# Patient Record
Sex: Male | Born: 1945 | Race: White | Hispanic: No | Marital: Married | Smoking: Former smoker
Health system: Southern US, Community
[De-identification: ages and names within clinical notes are randomized; demographics above are authoritative.]

## PROBLEM LIST (undated history)

## (undated) DIAGNOSIS — N400 Enlarged prostate without lower urinary tract symptoms: Secondary | ICD-10-CM

## (undated) DIAGNOSIS — T7840XA Allergy, unspecified, initial encounter: Secondary | ICD-10-CM

## (undated) DIAGNOSIS — I10 Essential (primary) hypertension: Secondary | ICD-10-CM

## (undated) DIAGNOSIS — M1990 Unspecified osteoarthritis, unspecified site: Secondary | ICD-10-CM

## (undated) DIAGNOSIS — Z87442 Personal history of urinary calculi: Secondary | ICD-10-CM

## (undated) DIAGNOSIS — N189 Chronic kidney disease, unspecified: Secondary | ICD-10-CM

## (undated) DIAGNOSIS — R0609 Other forms of dyspnea: Secondary | ICD-10-CM

## (undated) DIAGNOSIS — J189 Pneumonia, unspecified organism: Secondary | ICD-10-CM

## (undated) DIAGNOSIS — I219 Acute myocardial infarction, unspecified: Secondary | ICD-10-CM

## (undated) DIAGNOSIS — R9439 Abnormal result of other cardiovascular function study: Secondary | ICD-10-CM

## (undated) DIAGNOSIS — Z789 Other specified health status: Secondary | ICD-10-CM

## (undated) DIAGNOSIS — Z136 Encounter for screening for cardiovascular disorders: Secondary | ICD-10-CM

## (undated) DIAGNOSIS — E7800 Pure hypercholesterolemia, unspecified: Secondary | ICD-10-CM

## (undated) DIAGNOSIS — E119 Type 2 diabetes mellitus without complications: Secondary | ICD-10-CM

## (undated) DIAGNOSIS — I2510 Atherosclerotic heart disease of native coronary artery without angina pectoris: Secondary | ICD-10-CM

## (undated) DIAGNOSIS — M109 Gout, unspecified: Secondary | ICD-10-CM

## (undated) DIAGNOSIS — R079 Chest pain, unspecified: Secondary | ICD-10-CM

## (undated) HISTORY — PX: HERNIA REPAIR: SHX51

## (undated) HISTORY — PX: COLONOSCOPY: SHX174

## (undated) HISTORY — PX: CYSTOSCOPY WITH URETEROSCOPY, STONE BASKETRY AND STENT PLACEMENT: SHX6378

## (undated) HISTORY — DX: Other forms of dyspnea: R06.09

## (undated) HISTORY — DX: Chronic kidney disease, unspecified: N18.9

## (undated) HISTORY — DX: Allergy, unspecified, initial encounter: T78.40XA

## (undated) HISTORY — DX: Benign prostatic hyperplasia without lower urinary tract symptoms: N40.0

## (undated) HISTORY — DX: Encounter for screening for cardiovascular disorders: Z13.6

## (undated) HISTORY — PX: JOINT REPLACEMENT: SHX530

## (undated) HISTORY — PX: UPPER GASTROINTESTINAL ENDOSCOPY: SHX188

---

## 1990-10-02 HISTORY — PX: UMBILICAL HERNIA REPAIR: SHX196

## 2013-04-08 DIAGNOSIS — N400 Enlarged prostate without lower urinary tract symptoms: Secondary | ICD-10-CM | POA: Insufficient documentation

## 2014-10-02 HISTORY — PX: KNEE ARTHROSCOPY: SHX127

## 2015-05-12 DIAGNOSIS — L821 Other seborrheic keratosis: Secondary | ICD-10-CM | POA: Insufficient documentation

## 2015-05-12 DIAGNOSIS — E119 Type 2 diabetes mellitus without complications: Secondary | ICD-10-CM

## 2015-05-12 DIAGNOSIS — I10 Essential (primary) hypertension: Secondary | ICD-10-CM | POA: Insufficient documentation

## 2015-05-12 DIAGNOSIS — L919 Hypertrophic disorder of the skin, unspecified: Secondary | ICD-10-CM

## 2015-05-12 DIAGNOSIS — L909 Atrophic disorder of skin, unspecified: Secondary | ICD-10-CM | POA: Insufficient documentation

## 2015-05-12 DIAGNOSIS — M109 Gout, unspecified: Secondary | ICD-10-CM | POA: Insufficient documentation

## 2015-05-12 DIAGNOSIS — E785 Hyperlipidemia, unspecified: Secondary | ICD-10-CM | POA: Insufficient documentation

## 2015-05-12 DIAGNOSIS — E782 Mixed hyperlipidemia: Secondary | ICD-10-CM | POA: Insufficient documentation

## 2015-05-12 HISTORY — DX: Essential (primary) hypertension: I10

## 2015-05-12 HISTORY — DX: Type 2 diabetes mellitus without complications: E11.9

## 2016-11-26 DIAGNOSIS — M1812 Unilateral primary osteoarthritis of first carpometacarpal joint, left hand: Secondary | ICD-10-CM | POA: Insufficient documentation

## 2016-12-05 LAB — HM COLONOSCOPY

## 2016-12-08 DIAGNOSIS — D126 Benign neoplasm of colon, unspecified: Secondary | ICD-10-CM | POA: Insufficient documentation

## 2017-04-01 DIAGNOSIS — I219 Acute myocardial infarction, unspecified: Secondary | ICD-10-CM

## 2017-04-01 HISTORY — DX: Acute myocardial infarction, unspecified: I21.9

## 2017-05-23 ENCOUNTER — Encounter (HOSPITAL_COMMUNITY): Payer: Self-pay | Admitting: Cardiology

## 2017-05-23 NOTE — H&P (Signed)
Nicholas Maxwell is an 71 y.o. male.    (From clinic encounter dated 05/11/2017 and subsequent telephone conversations)  Chief Complaint: Chest pain  HPI: Nicholas Maxwell  is a 71 y.o. male  With hypertension, type 2 DM with exertional chest pain associated with shortness of breath. He underwent workup EKG, echocardiogram, nuclear stress test that showed high risk findings as below. He is on two anti anginal medications with continued symptoms of angina. He is referred for cardiac catheterization to define coronary anatomy and offer revascularization, as appropriate  Past Medical History:  Diagnosis Date  . Abnormal stress test 05/18/2017  . Chest pain   . Diabetes mellitus without complication ([place])   . Hypertension   . Statin intolerance    Used >2 different statins    Past Surgical History:  Procedure Laterality Date  . knee surgery Right 2016   Arthroscopic  . UMBILICAL HERNIA REPAIR  1992    Family History  Problem Relation Age of Onset  . Transient ischemic attack Father 23  . Bone cancer Father   . CAD Mother 87       CABG   Social History:  reports that he has quit smoking. His smoking use included Cigarettes. He does not have any smokeless tobacco history on file. He reports that he does not drink alcohol or use drugs.  Allergies:  Allergies  Allergen Reactions  . Crestor [Rosuvastatin Calcium] Anaphylaxis and Swelling    Tongue swells/throat closes  . Statins Other (See Comments)    Muscle/joint pain with ALL other statin drug but Crestor is severe   Review of Systems Joya Gaskins Therisa Mennella MD; 05/11/2017 3:45 PM) General Not Present- Appetite Loss and Weight Gain. Respiratory Not Present- Chronic Cough and Wakes up from Sleep Wheezing or Short of Breath. Cardiovascular Present- Chest Pain, Difficulty Breathing On Exertion and Shortness of Breath. Not Present- Difficulty Breathing Lying Down, Fainting, Leg Cramps, Leg Pain and/or Swelling, Palpitations and Swelling  of Extremities. Gastrointestinal Not Present- Black, Tarry Stool and Difficulty Swallowing. Musculoskeletal Not Present- Decreased Range of Motion and Muscle Atrophy. Neurological Not Present- Attention Deficit. Psychiatric Not Present- Personality Changes and Suicidal Ideation. Endocrine Not Present- Cold Intolerance and Heat Intolerance. Hematology Not Present- Abnormal Bleeding. All other systems negative   Vitals (April Garrison; 05/11/2017 2:43 PM) 05/11/2017 2:17 PM Weight: 202.31 lb Height: 71in Body Surface Area: 2.12 m Body Mass Index: 28.22 kg/m  Pulse: 71 (Regular)  P.OX: 98% (Room air) BP: 122/72 (Sitting, Left Arm, Standard)   Physical Exam Joya Gaskins Sevannah Madia MD; 05/11/2017 3:47 PM) General Mental Status-Alert. General Appearance-Cooperative and Appears stated age. Build & Nutrition-Moderately built.  Head and Neck Thyroid Gland Characteristics - normal size and consistency and no palpable nodules.  Chest and Lung Exam Chest and lung exam reveals -quiet, even and easy respiratory effort with no use of accessory muscles, non-tender and on auscultation, normal breath sounds, no adventitious sounds.  Cardiovascular Cardiovascular examination reveals -normal heart sounds, regular rate and rhythm with no murmurs, carotid auscultation reveals no bruits and abdominal aorta auscultation reveals no bruits and no prominent pulsation.  Abdomen Palpation/Percussion Palpation and Percussion of the abdomen reveal - Non Tender and No hepatosplenomegaly.  Peripheral Vascular Lower Extremity Inspection - Bilateral - Varicose veins(Mild varicosities bilaterally.). Palpation - Femoral pulse - Bilateral - 2+. Popliteal pulse - Bilateral - 2+. Dorsalis pedis pulse - Bilateral - 2+. Posterior tibial pulse - Bilateral - 2+. Lower Extremity Palpation - Pulses bilaterally normal. Carotid arteries - Bilateral-No Carotid  bruit.  Neurologic Neurologic evaluation  reveals -alert and oriented x 3 with no impairment of recent or remote memory. Motor-Grossly intact without any focal deficits.  Musculoskeletal Global Assessment Left Lower Extremity - no deformities, masses or tenderness, no known fractures. Right Lower Extremity - no deformities, masses or tenderness, no known fractures.  No results found for this or any previous visit (from the past 48 hour(s)).  Labs: Labs 05/21/2017: BUN 23/crit in 1.08 sodium 139/potassium 4.4. Glucose 100 WBC 8.2, hemoglobin 13.4/hematocrit 38.7. Platelets 218 INR 1.1 Labs 05/08/2017: LDL 123, total cholesterol 172, triglycerides 160, HDL 35 A1c 6.1 Sodium 139, potassium 4.3, BUN 21, creatinine 1.15. EGFR >60 Protein 7.0, albumin 4.4, normal liver enzymes WBC 6.2, hemoglobin 14.2, hematocrit 42.4, mild macrocytic index 94.7, platelets 216 BNP 156 Normal chest x-ray Recorded 05/22/2017 05:12 PM by Vernell Leep MD, Annotation/Addendum.  Lipid Panel  05/08/2017: LDL 123, total cholesterol 172, triglycerides 160, HDL 35  BNP (last 3 results) None  HEMOGLOBIN A1C 6.1  Cardiac Panel (last 3 results) None  TSH None  No prescriptions prior to admission.     No current facility-administered medications for this encounter.   Current Outpatient Prescriptions:  .  alfuzosin (UROXATRAL) 10 MG 24 hr tablet, Take 10 mg by mouth daily with supper., Disp: , Rfl:  .  allopurinol (ZYLOPRIM) 300 MG tablet, Take 300 mg by mouth daily with lunch., Disp: , Rfl:  .  aspirin EC 81 MG tablet, Take 81 mg by mouth daily with breakfast., Disp: , Rfl:  .  isosorbide mononitrate (IMDUR) 30 MG 24 hr tablet, Take 30 mg by mouth daily., Disp: , Rfl:  .  metFORMIN (GLUCOPHAGE-XR) 500 MG 24 hr tablet, Take 1,000 mg by mouth daily with supper., Disp: , Rfl:  .  metoprolol succinate (TOPROL-XL) 25 MG 24 hr tablet, Take 25 mg by mouth daily with breakfast., Disp: , Rfl:  .  nitroGLYCERIN (NITROSTAT) 0.4 MG SL tablet, Place 0.4 mg  under the tongue every 5 (five) minutes x 3 doses as needed. For chest pain., Disp: , Rfl:  .  ramipril (ALTACE) 2.5 MG capsule, Take 2.5 mg by mouth daily with breakfast., Disp: , Rfl:  .  ramipril (ALTACE) 5 MG capsule, Take 5 mg by mouth daily with breakfast., Disp: , Rfl:  .  REPATHA SURECLICK 884 MG/ML SOAJ, Inject 140 mg into the skin every 14 (fourteen) days., Disp: , Rfl:   CARDIAC STUDIES:  Echocardiogram 05/15/2017: Left ventricle cavity is normal in size. Left ventricle regional wall motion findings: Basal anterolateral, Basal inferolateral, Mid anterolateral and Mid inferolateral hypokinesis. Calculated EF 55%. Structurally normal mitral valve with moderate (Grade II) regurgitation.  Echocardiogram 05/15/2017: Left ventricle cavity is normal in size. Left ventricle regional wall motion findings: Basal anterolateral, Basal inferolateral, Mid anterolateral and Mid inferolateral hypokinesis. Calculated EF 55%. Structurally normal mitral valve with moderate (Grade II) regurgitation.  Exercise myoview stress 05/18/2017: 1. The patient performed treadmill exercise using a Bruce protocol, completing 8 minutes. The patient completed an estimated workload of 10.12 METS achieving 85% maximum predicted heart rate. Stress symptoms included 4/10 chest pain and dyspnea.  2. The stress electrocardiogram revealed 1-1.5 mm of horizontal ST depression in lead (s):II, III, aVF, V4, V5, V6 consistent with myocardial ischemia.   3. This is an abnormal myocardial perfusion imaging study demonstrating a mixture of scar plus ischemia in the basal inferoseptal, basal inferior, basal inferolateral, mid inferoseptal, mid inferior, mid inferolateral and apical inferior myocardial wall(s) (LCx/RCA territory) 4.  Left ventricular systolic function was abnormal with regional wall motion abnormalities.  The left ventricular ejection fraction was calculated to be 39%.   5. This is a high risk  study  Assessment/Plan CAD w/stable angina Two anti anginal medications High risk stress non-invasive stress test  Plan: Left heart cath, coronary angiography with possible angioplasty  Nigel Mormon, MD 05/23/2017, 7:59 PM [facility] Pager: [phone] Office: [phone] If no answer: Cell:  [phone]

## 2017-05-24 ENCOUNTER — Encounter (HOSPITAL_COMMUNITY): Payer: Self-pay | Admitting: General Practice

## 2017-05-24 ENCOUNTER — Ambulatory Visit (HOSPITAL_COMMUNITY)
Admission: RE | Admit: 2017-05-24 | Discharge: 2017-05-25 | Disposition: A | Discharge status: Home / Self Care | Payer: Medicare HMO | Source: Ambulatory Visit | Attending: Cardiology | Admitting: Cardiology

## 2017-05-24 ENCOUNTER — Encounter (HOSPITAL_COMMUNITY)
Admission: RE | Disposition: A | Discharge status: Home / Self Care | Payer: Self-pay | Source: Ambulatory Visit | Attending: Cardiology

## 2017-05-24 DIAGNOSIS — Z7982 Long term (current) use of aspirin: Secondary | ICD-10-CM | POA: Diagnosis not present

## 2017-05-24 DIAGNOSIS — I10 Essential (primary) hypertension: Secondary | ICD-10-CM | POA: Insufficient documentation

## 2017-05-24 DIAGNOSIS — I2584 Coronary atherosclerosis due to calcified coronary lesion: Secondary | ICD-10-CM | POA: Insufficient documentation

## 2017-05-24 DIAGNOSIS — E119 Type 2 diabetes mellitus without complications: Secondary | ICD-10-CM | POA: Diagnosis not present

## 2017-05-24 DIAGNOSIS — Z87891 Personal history of nicotine dependence: Secondary | ICD-10-CM | POA: Diagnosis not present

## 2017-05-24 DIAGNOSIS — I25118 Atherosclerotic heart disease of native coronary artery with other forms of angina pectoris: Secondary | ICD-10-CM | POA: Insufficient documentation

## 2017-05-24 DIAGNOSIS — Z9861 Coronary angioplasty status: Secondary | ICD-10-CM

## 2017-05-24 DIAGNOSIS — Z8249 Family history of ischemic heart disease and other diseases of the circulatory system: Secondary | ICD-10-CM | POA: Insufficient documentation

## 2017-05-24 DIAGNOSIS — I209 Angina pectoris, unspecified: Secondary | ICD-10-CM

## 2017-05-24 DIAGNOSIS — Z7902 Long term (current) use of antithrombotics/antiplatelets: Secondary | ICD-10-CM | POA: Diagnosis not present

## 2017-05-24 DIAGNOSIS — Z7984 Long term (current) use of oral hypoglycemic drugs: Secondary | ICD-10-CM | POA: Diagnosis not present

## 2017-05-24 HISTORY — DX: Unspecified osteoarthritis, unspecified site: M19.90

## 2017-05-24 HISTORY — PX: LEFT HEART CATH AND CORONARY ANGIOGRAPHY: CATH118249

## 2017-05-24 HISTORY — DX: Other specified health status: Z78.9

## 2017-05-24 HISTORY — DX: Personal history of urinary calculi: Z87.442

## 2017-05-24 HISTORY — DX: Acute myocardial infarction, unspecified: I21.9

## 2017-05-24 HISTORY — PX: CORONARY PRESSURE/FFR STUDY: CATH118243

## 2017-05-24 HISTORY — PX: CORONARY ANGIOPLASTY WITH STENT PLACEMENT: SHX49

## 2017-05-24 HISTORY — DX: Gout, unspecified: M10.9

## 2017-05-24 HISTORY — PX: CORONARY STENT INTERVENTION: CATH118234

## 2017-05-24 HISTORY — DX: Essential (primary) hypertension: I10

## 2017-05-24 HISTORY — DX: Pure hypercholesterolemia, unspecified: E78.00

## 2017-05-24 HISTORY — DX: Chest pain, unspecified: R07.9

## 2017-05-24 HISTORY — DX: Type 2 diabetes mellitus without complications: E11.9

## 2017-05-24 HISTORY — DX: Abnormal result of other cardiovascular function study: R94.39

## 2017-05-24 HISTORY — DX: Pneumonia, unspecified organism: J18.9

## 2017-05-24 LAB — GLUCOSE, CAPILLARY
GLUCOSE-CAPILLARY: 118 mg/dL — AB (ref 65–99)
GLUCOSE-CAPILLARY: 148 mg/dL — AB (ref 65–99)
Glucose-Capillary: 148 mg/dL — ABNORMAL HIGH (ref 65–99)
Glucose-Capillary: 134 mg/dL — ABNORMAL HIGH (ref 65–99)

## 2017-05-24 LAB — POCT ACTIVATED CLOTTING TIME
ACTIVATED CLOTTING TIME: 318 s
ACTIVATED CLOTTING TIME: 213 s
Activated Clotting Time: 345 seconds

## 2017-05-24 SURGERY — LEFT HEART CATH AND CORONARY ANGIOGRAPHY
Anesthesia: LOCAL

## 2017-05-24 MED ORDER — CLOPIDOGREL BISULFATE 75 MG PO TABS
75.0000 mg | ORAL_TABLET | Freq: Every day | ORAL | Status: DC
  Administered 2017-05-25: 75 mg via ORAL
  Filled 2017-05-24: qty 1

## 2017-05-24 MED ORDER — IOPAMIDOL (ISOVUE-370) INJECTION 76%
INTRAVENOUS | Status: AC
Start: 2017-05-24 — End: 2017-05-24
  Filled 2017-05-24: qty 50

## 2017-05-24 MED ORDER — ANGIOPLASTY BOOK
Freq: Once | Status: AC
  Administered 2017-05-24: 20:00:00
  Filled 2017-05-24: qty 1

## 2017-05-24 MED ORDER — EVOLOCUMAB 140 MG/ML ~~LOC~~ SOAJ
140.0000 mg | SUBCUTANEOUS | Status: DC
Start: 2017-05-24 — End: 2017-05-24

## 2017-05-24 MED ORDER — INSULIN ASPART 100 UNIT/ML ~~LOC~~ SOLN
4.0000 [IU] | Freq: Three times a day (TID) | SUBCUTANEOUS | Status: DC
  Administered 2017-05-24 – 2017-05-25 (×2): 4 [IU] via SUBCUTANEOUS

## 2017-05-24 MED ORDER — SODIUM CHLORIDE 0.9% FLUSH
3.0000 mL | Freq: Two times a day (BID) | INTRAVENOUS | Status: DC
  Administered 2017-05-24 (×2): 3 mL via INTRAVENOUS

## 2017-05-24 MED ORDER — SODIUM CHLORIDE 0.9 % IV SOLN: 250.0000 mL | INTRAVENOUS | Status: DC | PRN

## 2017-05-24 MED ORDER — METFORMIN HCL 500 MG PO TABS: 500.0000 mg | ORAL_TABLET | Freq: Every day | ORAL | Status: DC

## 2017-05-24 MED ORDER — ASPIRIN 81 MG PO CHEW
81.0000 mg | CHEWABLE_TABLET | ORAL | Status: AC
  Administered 2017-05-24: 81 mg via ORAL

## 2017-05-24 MED ORDER — ALFUZOSIN HCL ER 10 MG PO TB24: 10.0000 mg | ORAL_TABLET | Freq: Every day | ORAL | Status: DC

## 2017-05-24 MED ORDER — IOPAMIDOL (ISOVUE-370) INJECTION 76%
INTRAVENOUS | Status: DC | PRN
  Administered 2017-05-24: 350 mL via INTRA_ARTERIAL

## 2017-05-24 MED ORDER — HEPARIN SODIUM (PORCINE) 1000 UNIT/ML IJ SOLN
INTRAMUSCULAR | Status: DC | PRN
Start: 2017-05-24 — End: 2017-05-24
  Administered 2017-05-24: 2000 [IU] via INTRAVENOUS
  Administered 2017-05-24: 5000 [IU] via INTRAVENOUS
  Administered 2017-05-24: 3000 [IU] via INTRAVENOUS
  Administered 2017-05-24: 4000 [IU] via INTRAVENOUS
  Administered 2017-05-24: 2000 [IU] via INTRAVENOUS

## 2017-05-24 MED ORDER — SODIUM CHLORIDE 0.9% FLUSH: 3.0000 mL | Freq: Two times a day (BID) | INTRAVENOUS | Status: DC

## 2017-05-24 MED ORDER — FENTANYL CITRATE (PF) 100 MCG/2ML IJ SOLN
INTRAMUSCULAR | Status: AC
  Filled 2017-05-24: qty 2

## 2017-05-24 MED ORDER — HEPARIN SODIUM (PORCINE) 1000 UNIT/ML IJ SOLN
INTRAMUSCULAR | Status: AC
  Filled 2017-05-24: qty 1

## 2017-05-24 MED ORDER — SODIUM CHLORIDE 0.9% FLUSH: 3.0000 mL | INTRAVENOUS | Status: DC | PRN

## 2017-05-24 MED ORDER — ALLOPURINOL 300 MG PO TABS
300.0000 mg | ORAL_TABLET | Freq: Every day | ORAL | Status: DC
  Administered 2017-05-24: 300 mg via ORAL
  Filled 2017-05-24: qty 1

## 2017-05-24 MED ORDER — IOPAMIDOL (ISOVUE-370) INJECTION 76%
INTRAVENOUS | Status: AC
  Filled 2017-05-24: qty 50

## 2017-05-24 MED ORDER — ASPIRIN EC 81 MG PO TBEC
81.0000 mg | DELAYED_RELEASE_TABLET | Freq: Every day | ORAL | Status: DC
  Administered 2017-05-25: 81 mg via ORAL
  Filled 2017-05-24: qty 1

## 2017-05-24 MED ORDER — METOPROLOL SUCCINATE ER 25 MG PO TB24
25.0000 mg | ORAL_TABLET | Freq: Every day | ORAL | Status: DC
  Administered 2017-05-25: 25 mg via ORAL
  Filled 2017-05-24: qty 1

## 2017-05-24 MED ORDER — MIDAZOLAM HCL 2 MG/2ML IJ SOLN
INTRAMUSCULAR | Status: DC | PRN
  Administered 2017-05-24: 1 mg via INTRAVENOUS

## 2017-05-24 MED ORDER — IOPAMIDOL (ISOVUE-370) INJECTION 76%
INTRAVENOUS | Status: AC
  Filled 2017-05-24: qty 100

## 2017-05-24 MED ORDER — ONDANSETRON HCL 4 MG/2ML IJ SOLN: 4.0000 mg | Freq: Four times a day (QID) | INTRAMUSCULAR | Status: DC | PRN

## 2017-05-24 MED ORDER — VERAPAMIL HCL 2.5 MG/ML IV SOLN
INTRA_ARTERIAL | Status: DC | PRN
  Administered 2017-05-24: 15 mL via INTRA_ARTERIAL

## 2017-05-24 MED ORDER — RAMIPRIL 2.5 MG PO CAPS
2.5000 mg | ORAL_CAPSULE | Freq: Every day | ORAL | Status: DC
  Administered 2017-05-25: 08:00:00 2.5 mg via ORAL
  Filled 2017-05-24: qty 1

## 2017-05-24 MED ORDER — CLOPIDOGREL BISULFATE 300 MG PO TABS
ORAL_TABLET | ORAL | Status: AC
  Filled 2017-05-24: qty 1

## 2017-05-24 MED ORDER — LABETALOL HCL 5 MG/ML IV SOLN: 10.0000 mg | INTRAVENOUS | Status: AC | PRN

## 2017-05-24 MED ORDER — VERAPAMIL HCL 2.5 MG/ML IV SOLN
INTRAVENOUS | Status: AC
  Filled 2017-05-24: qty 2

## 2017-05-24 MED ORDER — SODIUM CHLORIDE 0.9 % WEIGHT BASED INFUSION
3.0000 mL/kg/h | INTRAVENOUS | Status: DC
  Administered 2017-05-24: 3 mL/kg/h via INTRAVENOUS

## 2017-05-24 MED ORDER — NITROGLYCERIN 1 MG/10 ML FOR IR/CATH LAB
INTRA_ARTERIAL | Status: AC
  Filled 2017-05-24: qty 10

## 2017-05-24 MED ORDER — LIDOCAINE HCL 1 % IJ SOLN
INTRAMUSCULAR | Status: AC
  Filled 2017-05-24: qty 20

## 2017-05-24 MED ORDER — ASPIRIN 81 MG PO CHEW
CHEWABLE_TABLET | ORAL | Status: AC
  Filled 2017-05-24: qty 1

## 2017-05-24 MED ORDER — LIDOCAINE HCL (PF) 1 % IJ SOLN
INTRAMUSCULAR | Status: DC | PRN
  Administered 2017-05-24: 2 mL

## 2017-05-24 MED ORDER — INSULIN ASPART 100 UNIT/ML ~~LOC~~ SOLN: 0.0000 [IU] | Freq: Every day | SUBCUTANEOUS | Status: DC

## 2017-05-24 MED ORDER — ACETAMINOPHEN 325 MG PO TABS
650.0000 mg | ORAL_TABLET | ORAL | Status: DC | PRN
  Administered 2017-05-24: 650 mg via ORAL
  Filled 2017-05-24: qty 2

## 2017-05-24 MED ORDER — SODIUM CHLORIDE 0.9 % WEIGHT BASED INFUSION: 1.0000 mL/kg/h | INTRAVENOUS | Status: DC

## 2017-05-24 MED ORDER — CLOPIDOGREL BISULFATE 300 MG PO TABS
ORAL_TABLET | ORAL | Status: DC | PRN
  Administered 2017-05-24: 600 mg via ORAL

## 2017-05-24 MED ORDER — HEPARIN (PORCINE) IN NACL 2-0.9 UNIT/ML-% IJ SOLN
INTRAMUSCULAR | Status: AC | PRN
  Administered 2017-05-24: 1000 mL

## 2017-05-24 MED ORDER — ADENOSINE (DIAGNOSTIC) 140MCG/KG/MIN
INTRAVENOUS | Status: DC | PRN
  Administered 2017-05-24: 140 ug/kg/min via INTRAVENOUS

## 2017-05-24 MED ORDER — HEPARIN (PORCINE) IN NACL 2-0.9 UNIT/ML-% IJ SOLN
INTRAMUSCULAR | Status: AC
  Filled 2017-05-24: qty 500

## 2017-05-24 MED ORDER — NITROGLYCERIN 1 MG/10 ML FOR IR/CATH LAB
INTRA_ARTERIAL | Status: DC | PRN
  Administered 2017-05-24 (×2): 200 ug via INTRACORONARY
  Administered 2017-05-24: 400 ug via INTRACORONARY

## 2017-05-24 MED ORDER — SODIUM CHLORIDE 0.9 % IV SOLN
INTRAVENOUS | Status: AC
  Administered 2017-05-24: 12:00:00 via INTRAVENOUS

## 2017-05-24 MED ORDER — ADENOSINE 12 MG/4ML IV SOLN
INTRAVENOUS | Status: AC
  Filled 2017-05-24: qty 16

## 2017-05-24 MED ORDER — FENTANYL CITRATE (PF) 100 MCG/2ML IJ SOLN
INTRAMUSCULAR | Status: DC | PRN
  Administered 2017-05-24 (×2): 25 ug via INTRAVENOUS

## 2017-05-24 MED ORDER — INSULIN ASPART 100 UNIT/ML ~~LOC~~ SOLN: 2.0000 [IU] | Freq: Three times a day (TID) | SUBCUTANEOUS | Status: DC

## 2017-05-24 MED ORDER — NITROGLYCERIN 0.4 MG SL SUBL: 0.4000 mg | SUBLINGUAL_TABLET | SUBLINGUAL | Status: DC | PRN

## 2017-05-24 MED ORDER — ISOSORBIDE MONONITRATE ER 30 MG PO TB24
30.0000 mg | ORAL_TABLET | Freq: Every day | ORAL | Status: DC
  Filled 2017-05-24: qty 1

## 2017-05-24 MED ORDER — MIDAZOLAM HCL 2 MG/2ML IJ SOLN
INTRAMUSCULAR | Status: AC
  Filled 2017-05-24: qty 2

## 2017-05-24 SURGICAL SUPPLY — 26 items
BALLN EMERGE MR 2.0X12 (BALLOONS) ×2
BALLN SAPPHIRE 2.5X12 (BALLOONS) ×2
BALLN SAPPHIRE ~~LOC~~ 2.75X15 (BALLOONS) ×2 IMPLANT
BALLN ~~LOC~~ EUPHORA RX 4.0X15 (BALLOONS) ×2
BALLOON EMERGE MR 2.0X12 (BALLOONS) ×1 IMPLANT
BALLOON SAPPHIRE 2.5X12 (BALLOONS) ×1 IMPLANT
BALLOON ~~LOC~~ EUPHORA RX 4.0X15 (BALLOONS) ×1 IMPLANT
CATH GUIDEZILLA II 6F (CATHETERS) ×1 IMPLANT
CATH INFINITI 5 FR JL3.5 (CATHETERS) ×2 IMPLANT
CATH INFINITI JR4 5F (CATHETERS) ×2 IMPLANT
CATH LAUNCHER 6FR EBU3.5 (CATHETERS) ×2 IMPLANT
CATHETER GUIDEZILLA II 6F (CATHETERS) ×2
DEVICE RAD COMP TR BAND LRG (VASCULAR PRODUCTS) ×2 IMPLANT
GLIDESHEATH SLEND A-KIT 6F 22G (SHEATH) ×2 IMPLANT
GUIDEWIRE INQWIRE 1.5J.035X260 (WIRE) ×1 IMPLANT
GUIDEWIRE PRESSURE COMET II (WIRE) ×2 IMPLANT
INQWIRE 1.5J .035X260CM (WIRE) ×2
KIT ENCORE 26 ADVANTAGE (KITS) ×2 IMPLANT
KIT HEART LEFT (KITS) ×2 IMPLANT
PACK CARDIAC CATHETERIZATION (CUSTOM PROCEDURE TRAY) ×2 IMPLANT
STENT RESOLUTE ONYX 2.75X15 (Permanent Stent) ×2 IMPLANT
STENT RESOLUTE ONYX 4.0X22 (Permanent Stent) ×2 IMPLANT
TRANSDUCER W/STOPCOCK (MISCELLANEOUS) ×2 IMPLANT
TUBING CIL FLEX 10 FLL-RA (TUBING) ×2 IMPLANT
VALVE GUARDIAN II ~~LOC~~ HEMO (MISCELLANEOUS) ×2 IMPLANT
WIRE COUGAR XT STRL 190CM (WIRE) ×2 IMPLANT

## 2017-05-24 NOTE — Progress Notes (Signed)
Updated vitals:  Vitals:   05/24/17 0558  BP: 127/82  Pulse: 66  Resp: 18  Temp: 98.5 F (36.9 C)  SpO2: 95%

## 2017-05-24 NOTE — Progress Notes (Signed)
Successful two vessel PCI through radial access. Full report to follow  Nigel Mormon, MD 05/19/2017, 1:29 PM [facility] Pager: [phone] Office: [phone] If no answer Cell [phone]

## 2017-05-24 NOTE — Interval H&P Note (Signed)
History and Physical Interval Note:  05/24/2017 7:31 AM  Nicholas Maxwell  has presented today for surgery, with the diagnosis of positive stress test  The various methods of treatment have been discussed with the patient and family. After consideration of risks, benefits and other options for treatment, the patient has consented to  Procedure(s): LEFT HEART CATH AND CORONARY ANGIOGRAPHY (N/A) as a surgical intervention .  The patient's history has been reviewed, patient examined, no change in status, stable for surgery.  I have reviewed the patient's chart and labs.  Questions were answered to the patient's satisfaction.    Patient Information:   1-2V CAD, no prox LAD A (9) Indication: 19; Score 9 1249 Patient Information:   CTO of 1 vessel, no other CAD A (8) Indication: 29; Score 8 1248 Patient Information:   1V CAD with prox LAD A (9) Indication: 35; Score 9 1250 Patient Information:   2V-CAD with prox LAD A (9) Indication: 41; Score 9 1251 Patient Information:   3V-CAD without LMCA A (9) Indication: 47; Score 9 1254 Patient Information:   3V-CAD without LMCA  With Abnormal LV systolic function A (9) Indication: 48; Score 9 1255 Patient Information:   LMCA-CAD A (9) Indication: 49; Score 9 1260 Patient Information:   2V-CAD with prox LAD  PCI A (7) Indication: 62; Score 7 1253 Patient Information:   2V-CAD with prox LAD  CABG A (8) Indication: 62; Score 8 1252 Patient Information:   3V-CAD without LMCA  With Low CAD burden(i.e., 3 focal stenoses, low SYNTAX score)  PCI A (7) Indication: 63; Score 7 1257 Patient Information:   3V-CAD without LMCA  With Low CAD burden(i.e., 3 focal stenoses, low SYNTAX score)  CABG A (9) Indication: 63; Score 9 1256 Patient Information:   3V-CAD without LMCA  E06c - Intermediate-high CAD burden (i.e., multiple diffuse lesions, presence of CTO, or high SYNTAX score)  PCI U (4) Indication:  64; Score 4 1259 Patient Information:   3V-CAD without LMCA  E06c - Intermediate-high CAD burden (i.e., multiple diffuse lesions, presence of CTO, or high SYNTAX score)  CABG A (9) Indication: 64; Score 9 1258 Patient Information:   LMCA-CAD  With Isolated LMCA stenosis  PCI U (6) Indication: 65; Score 6 1262 Patient Information:   LMCA-CAD  With Isolated LMCA stenosis  CABG A (9) Indication: 65; Score 9 1261 Patient Information:   LMCA-CAD  Additional CAD, low CAD burden (i.e., 1- to 2-vessel additional involvement, low SYNTAX score)  PCI U (5) Indication: 66; Score 5 1264 Patient Information:   LMCA-CAD  Additional CAD, low CAD burden (i.e., 1- to 2-vessel additional involvement, low SYNTAX score)  CABG A (9) Indication: 66; Score 9 1263 Patient Information:   LMCA-CAD  Additional CAD, intermediate-high CAD burden (i.e., 3-vessel involvement, presence of CTO, or high SYNTAX score)  PCI I (3) Indication: 67; Score 3 1266 Patient Information:   LMCA-CAD  Additional CAD, intermediate-high CAD burden (i.e., 3-vessel involvement, presence of CTO, or high SYNTAX score)  CABG A (9) Indication: 67; Score 9      Fayola Meckes J Moody Robben

## 2017-05-24 NOTE — Care Management Note (Signed)
Case Management Note  Patient Details  Name: Nicholas Maxwell MRN: [id number] Date of Birth: 09/30/1946  Subjective/Objective:    From home, s/p coronary stent intervention, will be on plavix.                Action/Plan: NCM will follow for dc needs.  Expected Discharge Date:                  Expected Discharge Plan:  Home/Self Care  In-House Referral:     Discharge planning Services  CM Consult  Post Acute Care Choice:    Choice offered to:     DME Arranged:    DME Agency:     HH Arranged:    [place] Agency:     Status of Service:  Completed, signed off  If discussed at [employer] of Stay Meetings, dates discussed:    Additional Comments:  Zenon Mayo, RN 05/24/2017, 3:42 PM

## 2017-05-25 ENCOUNTER — Encounter (HOSPITAL_COMMUNITY): Payer: Self-pay | Admitting: Cardiology

## 2017-05-25 DIAGNOSIS — I10 Essential (primary) hypertension: Secondary | ICD-10-CM | POA: Diagnosis not present

## 2017-05-25 DIAGNOSIS — E119 Type 2 diabetes mellitus without complications: Secondary | ICD-10-CM | POA: Diagnosis not present

## 2017-05-25 DIAGNOSIS — I2584 Coronary atherosclerosis due to calcified coronary lesion: Secondary | ICD-10-CM | POA: Diagnosis not present

## 2017-05-25 DIAGNOSIS — I25118 Atherosclerotic heart disease of native coronary artery with other forms of angina pectoris: Secondary | ICD-10-CM | POA: Diagnosis not present

## 2017-05-25 LAB — BASIC METABOLIC PANEL
Anion gap: 10 (ref 5–15)
BUN: 17 mg/dL (ref 6–20)
CHLORIDE: 104 mmol/L (ref 101–111)
CO2: 24 mmol/L (ref 22–32)
Calcium: 9.4 mg/dL (ref 8.9–10.3)
Creatinine, Ser: 1.13 mg/dL (ref 0.61–1.24)
GFR calc non Af Amer: >60 mL/min (ref >60)
Glucose, Bld: 136 mg/dL — ABNORMAL HIGH (ref 65–99)
POTASSIUM: 4.3 mmol/L (ref 3.5–5.1)
SODIUM: 138 mmol/L (ref 135–145)

## 2017-05-25 LAB — CBC
HEMATOCRIT: 40.2 % (ref 39.0–52.0)
Hemoglobin: 13.7 g/dL (ref 13.0–17.0)
MCH: 31.4 pg (ref 26.0–34.0)
MCHC: 34.1 g/dL (ref 30.0–36.0)
MCV: 92.2 fL (ref 78.0–100.0)
PLATELETS: 177 10*3/uL (ref 150–400)
RBC: 4.36 MIL/uL (ref 4.22–5.81)
RDW: 13.4 % (ref 11.5–15.5)
WBC: 7.8 10*3/uL (ref 4.0–10.5)

## 2017-05-25 LAB — GLUCOSE, CAPILLARY: Glucose-Capillary: 134 mg/dL — ABNORMAL HIGH (ref 65–99)

## 2017-05-25 MED ORDER — CLOPIDOGREL BISULFATE 75 MG PO TABS: 75.0000 mg | ORAL_TABLET | Freq: Every day | ORAL | 2 refills | Status: DC

## 2017-05-25 NOTE — Progress Notes (Signed)
CARDIAC REHAB PHASE I   Pt walked 300 ft with NT. Denies angina sx, feels better. Ed completed with pt and wife, good reception. Understands importance of Plavix/ASA. Will refer to [place], ACSM 05/25/2017 8:44 AM

## 2017-05-25 NOTE — Discharge Summary (Signed)
Physician Discharge Summary  Patient ID: DEVYON KEATOR MRN: [id number] DOB/AGE: 70-Dec-1947 70 y.o.  Admit date: 05/24/2017 Discharge date: 05/25/2017  Primary Discharge Diagnosis Coronary artery disease  Secondary Discharge Diagnosis Hypertension Diabetes melitus type 2   Hospital Course:  Procedures   CORONARY STENT INTERVENTION  INTRAVASCULAR PRESSURE WIRE/FFR STUDY  LEFT HEART CATH AND CORONARY ANGIOGRAPHY  Conclusion   LM: Normal LAD: 60% mid LAD calcific stenosis FFR +ve 0.77. Mild diffuse disease in small caliber distal LAD  Successful complex PTCA and DES placement Onyx 2.75 X 15 mm Mid LAD     (60%-->0%, TIMI III - TIMI III) Ramus intermedius: Small caliber vessel with 50-70% stenoses LCx: Large dominant vessel with mid circumflex subtotal occlusion with retrograde filling of distal circumflex and PDA Successful PTCA and DES placement Onyx 4.0 X 22 mm Mid left circumflex     (99%-->0%, TIMI I - TIMI III) RCA: Small caliber nondominant vessel with mild diffuse disease  Loaded with 600 mg clopidogrel Recommend aspirin 81 mg daily lifelong and clopidogrel 75 mg daily for at least 6 months. Recommend aggressive medical therapy for residual mild coronary artery disease. Continue metoprolol XL 25 mg, isosorbide mononitrate 30 mg. Patient is intolerant to statins and currently working with insurance company to start Repatha     Recommendations on discharge:  F/u 05/30/17 8:30 AM Do not life heavy weights >10 lb with right hand for 2 weeks  Discharge Exam: Blood pressure (!) 146/83, pulse 63, temperature 97.8 F (36.6 C), temperature source Oral, resp. rate 18, height 5\' 11"  (1.803 m), weight 88.9 kg (195 lb 14.4 oz), SpO2 97 %.    Physical Exam Joya Gaskins Messiah Rovira MD; 05/11/2017 3:47 PM) General Mental Status-Alert. General Appearance-Cooperative and Appears stated age. Build & Nutrition-Moderately built.  Head and Neck Thyroid Gland Characteristics -  normal size and consistency and no palpable nodules.  Chest and Lung Exam Chest and lung exam reveals -quiet, even and easy respiratory effort with no use of accessory muscles, non-tender and on auscultation, normal breath sounds, no adventitious sounds.  Cardiovascular Cardiovascular examination reveals -normal heart sounds, regular rate and rhythm with no murmurs, carotid auscultation reveals no bruits and abdominal aorta auscultation reveals no bruits and no prominent pulsation.  Abdomen Palpation/Percussion Palpation and Percussion of the abdomen reveal - Non Tender and No hepatosplenomegaly.  Peripheral Vascular Lower Extremity Inspection - Bilateral - Varicose veins(Mild varicosities bilaterally.). Palpation - Femoral pulse - Bilateral - 2+. Popliteal pulse - Bilateral - 2+. Dorsalis pedis pulse - Bilateral - 2+. Posterior tibial pulse - Bilateral - 2+. Lower Extremity Palpation - Pulses bilaterally normal. Carotid arteries - Bilateral-No Carotid bruit. Right radial cath site with no swelling, hematoma, bleeding  Neurologic Neurologic evaluation reveals -alert and oriented x 3 with no impairment of recent or remote memory. Motor-Grossly intact without any focal deficits.  Musculoskeletal Global Assessment Left Lower Extremity - no deformities, masses or tenderness, no known fractures. Right Lower Extremity - no deformities, masses or tenderness, no known fractures.   Labs:  No results found for: WBC, HGB, HCT, MCV, PLT No results for input(s): NA, K, CL, CO2, BUN, CREATININE, CALCIUM, PROT, BILITOT, ALKPHOS, ALT, AST, GLUCOSE in the last 168 hours.  Invalid input(s): LABALBU  Lipid Panel  No results found for: CHOL, TRIG, HDL, CHOLHDL, VLDL, LDLCALC  BNP (last 3 results) No results for input(s): BNP in the last 8760 hours.  HEMOGLOBIN A1C No results found for: HGBA1C, MPG  Cardiac Panel (last 3 results) No results  for input(s): CKTOTAL, CKMB,  TROPONINI, RELINDX in the last 8760 hours.  No results found for: CKTOTAL, CKMB, CKMBINDEX, TROPONINI   TSH No results for input(s): TSH in the last 8760 hours.  EKG: normal EKG, normal sinus rhythm, unchanged from previous tracings.   Radiology: No results found.    FOLLOW UP PLANS AND APPOINTMENTS Discharge Instructions    AMB Referral to Cardiac Rehabilitation - Phase II    Complete by:  As directed    Diagnosis:  Coronary Stents   Call MD for:  difficulty breathing, headache or visual disturbances    Complete by:  As directed    Call MD for:  redness, tenderness, or signs of infection (pain, swelling, redness, odor or green/yellow discharge around incision site)    Complete by:  As directed    Diet - low sodium heart healthy    Complete by:  As directed    Discharge instructions    Complete by:  As directed    Please resume metformin on 05/26/2017 Please do not lift heavy objects >10 lbs with right hand for two weeks   Increase activity slowly    Complete by:  As directed      Allergies as of 05/25/2017      Reactions   Crestor [rosuvastatin Calcium] Anaphylaxis, Swelling   Tongue swells/throat closes   Statins Other (See Comments)   Muscle/joint pain with ALL other statin drug but Crestor is severe      Medication List    TAKE these medications   alfuzosin 10 MG 24 hr tablet Commonly known as:  UROXATRAL Take 10 mg by mouth daily with supper.   allopurinol 300 MG tablet Commonly known as:  ZYLOPRIM Take 300 mg by mouth daily with lunch.   aspirin EC 81 MG tablet Take 81 mg by mouth daily with breakfast.   clopidogrel 75 MG tablet Commonly known as:  PLAVIX Take 1 tablet (75 mg total) by mouth daily with breakfast.   isosorbide mononitrate 30 MG 24 hr tablet Commonly known as:  IMDUR Take 30 mg by mouth daily.   metFORMIN 500 MG 24 hr tablet Commonly known as:  GLUCOPHAGE-XR Take 1,000 mg by mouth daily with supper.   metoprolol succinate 25 MG 24  hr tablet Commonly known as:  TOPROL-XL Take 25 mg by mouth daily with breakfast.   nitroGLYCERIN 0.4 MG SL tablet Commonly known as:  NITROSTAT Place 0.4 mg under the tongue every 5 (five) minutes x 3 doses as needed. For chest pain.   ramipril 2.5 MG capsule Commonly known as:  ALTACE Take 2.5 mg by mouth daily with breakfast. What changed:  Another medication with the same name was removed. Continue taking this medication, and follow the directions you see here.   REPATHA SURECLICK 297 MG/ML [place] Generic drug:  Evolocumab Inject 140 mg into the skin every 14 (fourteen) days.            Discharge Care Instructions        Start     Ordered   05/25/17 0000  clopidogrel (PLAVIX) 75 MG tablet  Daily with breakfast     05/25/17 0621   05/25/17 0000  Discharge instructions    Comments:  Please resume metformin on 05/26/2017 Please do not lift heavy objects >10 lbs with right hand for two weeks   05/25/17 0621   05/25/17 0000  Increase activity slowly     05/25/17 0621   05/25/17 0000  Diet - low sodium  heart healthy     05/25/17 0621   05/25/17 0000  Call MD for:  redness, tenderness, or signs of infection (pain, swelling, redness, odor or green/yellow discharge around incision site)     05/25/17 0621   05/25/17 0000  Call MD for:  difficulty breathing, headache or visual disturbances     05/25/17 0621   05/24/17 0000  AMB Referral to Cardiac Rehabilitation - Phase II    Question:  Diagnosis:  Answer:  Coronary Stents   05/24/17 1027     Follow-up Information    Adrian Prows, MD Follow up on 05/30/2017.   Specialty:  Cardiology Why:  Follow up with Dr. Virgina Jock 8:30 AM Contact information: [address] [place] [address] [phone]            Vernell Leep MD, [facility] Office: [phone] If no answer: [phone]

## 2017-06-08 ENCOUNTER — Telehealth (HOSPITAL_COMMUNITY): Payer: Self-pay

## 2017-06-08 NOTE — Telephone Encounter (Signed)
Patient insurance is active and benefits verified. Patient insurance is [employer]- $10.00 co-payment, no deductible, out of pocket $5900/$561.32 has been met, no co-insurance and no pre-authorization. Passport/reference [id number].

## 2017-06-08 NOTE — Telephone Encounter (Signed)
I called and left message on voicemail to call office about scheduling for cardiac rehab. I left office contact information on patient voicemail to return call.  ° °

## 2017-06-27 ENCOUNTER — Encounter (HOSPITAL_COMMUNITY): Payer: Self-pay

## 2017-07-05 ENCOUNTER — Telehealth (HOSPITAL_COMMUNITY): Payer: Self-pay

## 2017-07-05 NOTE — Telephone Encounter (Signed)
Patient returned my call. Patient is very interested in cardiac rehab. Patient has scheduled a trip to [place], but is undecided if he wants to go. This trip requires a lot of walking and is unsure if he can handle it. Patient will call me back when he decides if he is or is not going on trip to schedule cardiac rehab. Patient has my contact information to call me back.

## 2017-08-07 ENCOUNTER — Telehealth (HOSPITAL_COMMUNITY): Payer: Self-pay

## 2017-08-07 NOTE — Telephone Encounter (Signed)
UPDATE: Patients insurance is active and benefits verified through [facility] - $10.00 co-pay, no deductible, out of pocket amount of $5,900/$906.36 has been met, no co-insurance, and no pre-authorization is required. Passport/reference [phone]

## 2017-08-07 NOTE — Telephone Encounter (Addendum)
Patient called in regards to Cardiac Rehab - Patient is really interested in the program but only wants to do two days a week as he goes to the [employer] on tuesdays and thursdays. Re-opened referral. Scheduled orientation on 08/21/2017 at 1:30pm. Patient will attend the 11:15am exc class. Patient stated that he will be out of town on the 26th but will start on the 30th.

## 2017-08-17 ENCOUNTER — Telehealth (HOSPITAL_COMMUNITY): Payer: Self-pay | Admitting: Pharmacist

## 2017-08-17 NOTE — Telephone Encounter (Signed)
Cardiac Rehab Medication Review by a Pharmacist  Does the patient  feel that his/her medications are working for him/her?  yes  Has the patient been experiencing any side effects to the medications prescribed?  Yes--sore muscles like he has been working out, he thinks this may be related to the repatha   Does the patient measure his/her own blood pressure or blood glucose at home?  yes --checks his BP on occasion, and checks his CBG like once a month because it is "always normal"  Does the patient have any problems obtaining medications due to transportation or finances?   No   Understanding of regimen: excellent Understanding of indications: excellent Potential of compliance: excellent    Pharmacist comments: Nicholas Maxwell is a 71 y.o. male who was in good spirits and very friendly on the phone this afternoon. He was very appreciative that we would call to discuss his medications with him. He has a very good understanding of his medications and was able to tell me all of them over the phone without prompting. I think he likely has very good adherence as he even told me about leaving certain medications next to the coffee pot to remember to take them first thing in the morning. He had no concerns other than the muscle aches listed above. I did note that he has a prescription for nitroglycerin and he told me he has never used this and the bottle has never been opened. Identified no barriers to treatment at this time. All questions were answered.    Thank you for allowing me to be involved in this patient's care.  Jalene Mullet, Pharm.D. PGY1 Pharmacy Resident 08/17/2017 4:45 PM Main Pharmacy: [phone]

## 2017-08-21 ENCOUNTER — Encounter (HOSPITAL_COMMUNITY): Payer: Self-pay

## 2017-08-21 ENCOUNTER — Encounter (HOSPITAL_COMMUNITY)
Admission: RE | Admit: 2017-08-21 | Discharge: 2017-08-21 | Disposition: A | Discharge status: Home / Self Care | Payer: Medicare HMO | Source: Ambulatory Visit | Attending: Cardiology | Admitting: Cardiology

## 2017-08-21 VITALS — BP 104/62 | HR 74 | Ht 69.75 in | Wt 201.9 lb

## 2017-08-21 DIAGNOSIS — Z7982 Long term (current) use of aspirin: Secondary | ICD-10-CM | POA: Diagnosis not present

## 2017-08-21 DIAGNOSIS — I10 Essential (primary) hypertension: Secondary | ICD-10-CM | POA: Diagnosis not present

## 2017-08-21 DIAGNOSIS — Z955 Presence of coronary angioplasty implant and graft: Secondary | ICD-10-CM | POA: Diagnosis present

## 2017-08-21 DIAGNOSIS — Z79899 Other long term (current) drug therapy: Secondary | ICD-10-CM | POA: Diagnosis not present

## 2017-08-21 DIAGNOSIS — Z87891 Personal history of nicotine dependence: Secondary | ICD-10-CM | POA: Diagnosis not present

## 2017-08-21 DIAGNOSIS — I252 Old myocardial infarction: Secondary | ICD-10-CM | POA: Insufficient documentation

## 2017-08-21 DIAGNOSIS — Z7984 Long term (current) use of oral hypoglycemic drugs: Secondary | ICD-10-CM | POA: Insufficient documentation

## 2017-08-21 DIAGNOSIS — E119 Type 2 diabetes mellitus without complications: Secondary | ICD-10-CM | POA: Diagnosis not present

## 2017-08-21 HISTORY — DX: Atherosclerotic heart disease of native coronary artery without angina pectoris: I25.10

## 2017-08-21 NOTE — Progress Notes (Signed)
Nicholas Maxwell 71 y.o. male DOB: [birth year] MRN: [id number]      Nutrition Note  1. Stented coronary artery    Past Medical History:  Diagnosis Date  . Abnormal stress test 05/18/2017  . Arthritis    "right knee" (05/24/2017)  . Chest pain   . Gout   . High cholesterol   . History of kidney stones   . Hypertension   . Myocardial infarction ([place]) 04/2017  . Pneumonia 1990s X 1  . Statin intolerance    Used >2 different statins  . Type II diabetes mellitus ([place])    Meds reviewed. Meftormin XR noted  HT: Ht Readings from Last 1 Encounters:  05/24/17 5\' 11"  (1.803 m)    WT: Wt Readings from Last 3 Encounters:  05/25/17 195 lb 14.4 oz (88.9 kg)     BMI 27.3   Current tobacco use? No  Labs:  Lipid Panel  No results found for: CHOL, TRIG, HDL, CHOLHDL, VLDL, LDLCALC, LDLDIRECT  No results found for: HGBA1C CBG (last 3)  No results for input(s): GLUCAP in the last 72 hours.  Nutrition Note Spoke with pt. Nutrition plan and goals reviewed with pt. Pt is diabetic. No recent A1c noted. Pt expressed understanding of the information reviewed. Pt aware of nutrition education classes offered and is unable to attend nutrition classes.  Nutrition Diagnosis ? Food-and nutrition-related knowledge deficit related to lack of exposure to information as related to diagnosis of: ? CVD ? DM ? Overweight related to excessive energy intake as evidenced by a BMI of 27.3  Nutrition Intervention ? Pt's individual nutrition plan and goals reviewed with pt. ? Pt given handouts for: ? Nutrition I class ? Nutrition II class    Nutrition Goal(s):  ? Pt to identify and limit food sources of saturated fat, trans fat, and sodium ? Pt to identify food quantities necessary to achieve weight loss of 6-24 lb at graduation from cardiac rehab. Goal wt of 185 lb desired.  ? CBG concentrations in the normal range or as close to normal as is safely possible.  Plan:  Pt to attend nutrition classes ?  Portion Distortion ? Diabetes Blitz ? Diabetes Q & A Will provide client-centered nutrition education as part of interdisciplinary care.   Monitor and evaluate progress toward nutrition goal with team.  Derek Mound, M.Ed, RD, LDN, CDE 08/21/2017 3:07 PM

## 2017-08-21 NOTE — Progress Notes (Signed)
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  alfuzosin (UROXATRAL) 10 MG 24 hr tablet, Take 10 mg by mouth daily with supper., Disp: , Rfl:  .  allopurinol (ZYLOPRIM) 300 MG tablet, Take 300 mg by mouth daily with lunch., Disp: , Rfl:  .  aspirin EC 81 MG tablet, Take 81 mg by mouth daily with breakfast., Disp: , Rfl:  .  clopidogrel (PLAVIX) 75 MG tablet, Take 1 tablet (75 mg total) by mouth daily with breakfast., Disp: 90 tablet, Rfl: 2 .  isosorbide mononitrate (IMDUR) 30 MG 24 hr tablet, Take 30 mg by mouth daily., Disp: , Rfl:  .  metFORMIN (GLUCOPHAGE-XR) 500 MG 24 hr tablet, Take 1,000 mg by mouth daily with supper., Disp: , Rfl:  .  metoprolol succinate (TOPROL-XL) 25 MG 24 hr tablet, Take 25 mg by mouth daily with breakfast., Disp: , Rfl:  .  nitroGLYCERIN (NITROSTAT) 0.4 MG SL tablet, Place 0.4 mg under the tongue every 5 (five) minutes x 3 doses as needed. For chest pain., Disp: , Rfl:  .  ramipril (ALTACE) 2.5 MG capsule, Take 5 mg daily with breakfast by mouth. , Disp: , Rfl:  .  REPATHA SURECLICK 742 MG/ML SOAJ, Inject 140 mg into the skin every 14 (fourteen) days., Disp: , Rfl:   Past Medical History: Past Medical History:  Diagnosis Date  . Abnormal stress test 05/18/2017  . Arthritis    "right knee" (05/24/2017)  . Chest pain   . Coronary artery disease   . Gout   . High cholesterol   .  History of kidney stones   . Hypertension   . Myocardial infarction ([place]) 04/2017  . Pneumonia 1990s X 1  . Statin intolerance    Used >2 different statins  . Type II diabetes mellitus ([facility])     Tobacco Use: Social History   Tobacco Use  Smoking Status Former Smoker  . Packs/day: 1.00  . Years: 30.00  . Pack years: 30.00  . Types: Cigarettes  . Last attempt to quit: 2003  . Years since quitting: 15.8  Smokeless Tobacco Never Used    Labs: Recent Review Flowsheet Data    There is no flowsheet data to display.      Capillary Blood Glucose: Lab Results  Component Value Date   GLUCAP 134 (H) 05/25/2017   GLUCAP 134 (H) 05/24/2017   GLUCAP 148 (H) 05/24/2017   GLUCAP 118 (H) 05/24/2017   GLUCAP 148 (H) 05/24/2017     Exercise Target Goals: Date: 08/21/17  Exercise Program Goal: Individual exercise prescription set with THRR, safety & activity barriers. Participant demonstrates ability to understand and report RPE using BORG scale, to self-measure pulse accurately, and to acknowledge the importance of the exercise prescription.  Exercise Prescription Goal: Starting with aerobic activity 30 plus minutes a day, 3 days per week for initial exercise prescription. Provide home exercise prescription and guidelines  that participant acknowledges understanding prior to discharge.  Activity Barriers & Risk Stratification: Activity Barriers & Cardiac Risk Stratification - 08/21/17 1554      Activity Barriers & Cardiac Risk Stratification   Activity Barriers  Other (comment)    Comments  R knee pain    Cardiac Risk Stratification  High       6 Minute Walk: 6 Minute Walk    Row Name 08/21/17 1537 08/21/17 1540 08/21/17 1547     6 Minute Walk   Phase  Initial  -  Initial   Distance  1671 feet  -  1671 feet   Walk Time  6 minutes  -  6 minutes   # of Rest Breaks  0  -  0   MPH  3.16  -  3.16   METS  3.45  -  3.45   RPE  12  -  12   Perceived Dyspnea   0  -  0   VO2  Peak  12.09  -  12.09   Symptoms  -  No  -   Resting HR  74 bpm  -  74 bpm   Resting BP  104/62  -  104/62   Resting Oxygen Saturation   98 %  -  98 %   Exercise Oxygen Saturation  during 6 min walk  96 %  -  96 %   Max Ex. HR  99 bpm  -  99 bpm   Max Ex. BP  122/62  -  122/62   2 Minute Post BP  128/62  -  128/82      Oxygen Initial Assessment:   Oxygen Re-Evaluation:   Oxygen Discharge (Final Oxygen Re-Evaluation):   Initial Exercise Prescription: Initial Exercise Prescription - 08/21/17 1500      Date of Initial Exercise RX and Referring Provider   Date  08/21/17    Referring Provider  Shirlee More MD      Treadmill   MPH  2.7    Grade  0    Minutes  10    METs  3.07      Bike   Level  0.8    Minutes  10    METs  2.68      NuStep   Level  3    SPM  80    Minutes  10    METs  2.5      Prescription Details   Frequency (times per week)  3    Duration  Progress to 30 minutes of continuous aerobic without signs/symptoms of physical distress      Intensity   THRR 40-80% of Max Heartrate  60-120    Ratings of Perceived Exertion  11-13    Perceived Dyspnea  0-4      Progression   Progression  Continue progressive overload as per policy without signs/symptoms or physical distress.      Resistance Training   Training Prescription  Yes    Weight  3lbs    Reps  10-15       Perform Capillary Blood Glucose checks as needed.  Exercise Prescription Changes:   Exercise Comments:   Exercise Goals and Review: Exercise Goals    Row Name 08/21/17 1351             Exercise Goals   Increase Physical Activity  Yes       Intervention  Provide advice, education, support and counseling about physical activity/exercise needs.;Develop  an individualized exercise prescription for aerobic and resistive training based on initial evaluation findings, risk stratification, comorbidities and participant's personal goals.       Expected Outcomes  Achievement of  increased cardiorespiratory fitness and enhanced flexibility, muscular endurance and strength shown through measurements of functional capacity and personal statement of participant.       Increase Strength and Stamina  Yes       Intervention  Provide advice, education, support and counseling about physical activity/exercise needs.;Develop an individualized exercise prescription for aerobic and resistive training based on initial evaluation findings, risk stratification, comorbidities and participant's personal goals.       Expected Outcomes  Achievement of increased cardiorespiratory fitness and enhanced flexibility, muscular endurance and strength shown through measurements of functional capacity and personal statement of participant.       Able to understand and use rate of perceived exertion (RPE) scale  Yes       Intervention  Provide education and explanation on how to use RPE scale       Expected Outcomes  Short Term: Able to use RPE daily in rehab to express subjective intensity level;Long Term:  Able to use RPE to guide intensity level when exercising independently       Knowledge and understanding of Target Heart Rate Range (THRR)  Yes       Intervention  Provide education and explanation of THRR including how the numbers were predicted and where they are located for reference       Expected Outcomes  Short Term: Able to state/look up THRR;Long Term: Able to use THRR to govern intensity when exercising independently;Short Term: Able to use daily as guideline for intensity in rehab       Able to check pulse independently  Yes       Intervention  Provide education and demonstration on how to check pulse in carotid and radial arteries.;Review the importance of being able to check your own pulse for safety during independent exercise       Expected Outcomes  Short Term: Able to explain why pulse checking is important during independent exercise;Long Term: Able to check pulse independently and  accurately       Understanding of Exercise Prescription  Yes       Intervention  Provide education, explanation, and written materials on patient's individual exercise prescription       Expected Outcomes  Short Term: Able to explain program exercise prescription;Long Term: Able to explain home exercise prescription to exercise independently          Exercise Goals Re-Evaluation :    Discharge Exercise Prescription (Final Exercise Prescription Changes):   Nutrition:  Target Goals: Understanding of nutrition guidelines, daily intake of sodium 1500mg , cholesterol 200mg , calories 30% from fat and 7% or less from saturated fats, daily to have 5 or more servings of fruits and vegetables.  Biometrics: Pre Biometrics - 08/21/17 1631      Pre Biometrics   Height  5' 9.75" (1.772 m)    Weight  201 lb 15.1 oz (91.6 kg)    Waist Circumference  41 inches    Hip Circumference  41.75 inches    Waist to Hip Ratio  0.98 %    BMI (Calculated)  29.17    Triceps Skinfold  17 mm    % Body Fat  29 %    Grip Strength  39 kg    Flexibility  13 in    Single Leg Stand  24.03 seconds  Nutrition Therapy Plan and Nutrition Goals: Nutrition Therapy & Goals - 08/21/17 1509      Nutrition Therapy   Diet  Carb Modified, Heart Healthy      Personal Nutrition Goals   Nutrition Goal  Pt to identify food quantities necessary to achieve weight loss of 6-24 lb at graduation from cardiac rehab. Goal wt of 185 lb desired.     Personal Goal #2  Pt to identify and limit food sources of saturated fat, trans fat, and sodium    Personal Goal #3  CBG concentrations in the normal range or as close to normal as is safely possible.      Intervention Plan   Intervention  Prescribe, educate and counsel regarding individualized specific dietary modifications aiming towards targeted core components such as weight, hypertension, lipid management, diabetes, heart failure and other comorbidities.    Expected  Outcomes  Short Term Goal: Understand basic principles of dietary content, such as calories, fat, sodium, cholesterol and nutrients.;Long Term Goal: Adherence to prescribed nutrition plan.       Nutrition Discharge: Nutrition Scores: Nutrition Assessments - 08/21/17 1509      MEDFICTS Scores   Pre Score  83       Nutrition Goals Re-Evaluation:   Nutrition Goals Re-Evaluation:   Nutrition Goals Discharge (Final Nutrition Goals Re-Evaluation):   Psychosocial: Target Goals: Acknowledge presence or absence of significant depression and/or stress, maximize coping skills, provide positive support system. Participant is able to verbalize types and ability to use techniques and skills needed for reducing stress and depression.  Initial Review & Psychosocial Screening: Initial Psych Review & Screening - 08/21/17 1413      Initial Review   Current issues with  None Identified      Family Dynamics   Good Support System?  Yes spouse, cousin, church family    Comments  no psychosocial needs identified, no interventions necessary      Barriers   Psychosocial barriers to participate in program  There are no identifiable barriers or psychosocial needs.      Screening Interventions   Interventions  Encouraged to exercise;Provide feedback about the scores to participant       Quality of Life Scores: Quality of Life - 08/21/17 1451      Quality of Life Scores   Health/Function Pre  18.47 %    Socioeconomic Pre  23.56 %    Psych/Spiritual Pre  19.79 %    Family Pre  21.8 %    GLOBAL Pre  20.37 %       PHQ-9: Recent Review Flowsheet Data    There is no flowsheet data to display.     Interpretation of Total Score  Total Score Depression Severity:  1-4 = Minimal depression, 5-9 = Mild depression, 10-14 = Moderate depression, 15-19 = Moderately severe depression, 20-27 = Severe depression   Psychosocial Evaluation and Intervention:   Psychosocial  Re-Evaluation:   Psychosocial Discharge (Final Psychosocial Re-Evaluation):   Vocational Rehabilitation: Provide vocational rehab assistance to qualifying candidates.   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 Participant will state understanding/return demonstration of topics presented.  Learning Barriers/Preferences: Learning Barriers/Preferences - 08/21/17 1552      Learning Barriers/Preferences  Learning Barriers  Sight    Learning Preferences  Written Material       Education Topics: Count Your Pulse:  -Group instruction provided by verbal instruction, demonstration, patient participation and written materials to support subject.  Instructors address importance of being able to find your pulse and how to count your pulse when at home without a heart monitor.  Patients get hands on experience counting their pulse with staff help and individually.   Heart Attack, Angina, and Risk Factor Modification:  -Group instruction provided by verbal instruction, video, and written materials to support subject.  Instructors address signs and symptoms of angina and heart attacks.    Also discuss risk factors for heart disease and how to make changes to improve heart health risk factors.   Functional Fitness:  -Group instruction provided by verbal instruction, demonstration, patient participation, and written materials to support subject.  Instructors address safety measures for doing things around the house.  Discuss how to get up and down off the floor, how to pick things up properly, how to safely get out of a chair without assistance, and balance training.   Meditation and Mindfulness:  -Group  instruction provided by verbal instruction, patient participation, and written materials to support subject.  Instructor addresses importance of mindfulness and meditation practice to help reduce stress and improve awareness.  Instructor also leads participants through a meditation exercise.    Stretching for Flexibility and Mobility:  -Group instruction provided by verbal instruction, patient participation, and written materials to support subject.  Instructors lead participants through series of stretches that are designed to increase flexibility thus improving mobility.  These stretches are additional exercise for major muscle groups that are typically performed during regular warm up and cool down.   Hands Only CPR:  -Group verbal, video, and participation provides a basic overview of AHA guidelines for community CPR. Role-play of emergencies allow participants the opportunity to practice calling for help and chest compression technique with discussion of AED use.   Hypertension: -Group verbal and written instruction that provides a basic overview of hypertension including the most recent diagnostic guidelines, risk factor reduction with self-care instructions and medication management.    Nutrition I class: Heart Healthy Eating:  -Group instruction provided by PowerPoint slides, verbal discussion, and written materials to support subject matter. The instructor gives an explanation and review of the Therapeutic Lifestyle Changes diet recommendations, which includes a discussion on lipid goals, dietary fat, sodium, fiber, plant stanol/sterol esters, sugar, and the components of a well-balanced, healthy diet.   CARDIAC REHAB PHASE II ORIENTATION from 08/21/2017 in [facility]  Date  08/21/17 [place] handouts given]  Educator  RD  Instruction Review Code  Not applicable      Nutrition II class: Lifestyle Skills:  -Group instruction provided by PowerPoint  slides, verbal discussion, and written materials to support subject matter. The instructor gives an explanation and review of label reading, grocery shopping for heart health, heart healthy recipe modifications, and ways to make healthier choices when eating out.   CARDIAC REHAB PHASE II ORIENTATION from 08/21/2017 in [facility]  Date  08/21/17 [place] handouts given]  Educator  RD  Instruction Review Code  Not applicable      Diabetes Question & Answer:  -Group instruction provided by PowerPoint slides, verbal discussion, and written materials to support subject matter. The instructor gives an explanation and review of diabetes co-morbidities, pre- and post-prandial blood glucose goals, pre-exercise blood  glucose goals, signs, symptoms, and treatment of hypoglycemia and hyperglycemia, and foot care basics.   Diabetes Blitz:  -Group instruction provided by PowerPoint slides, verbal discussion, and written materials to support subject matter. The instructor gives an explanation and review of the physiology behind type 1 and type 2 diabetes, diabetes medications and rational behind using different medications, pre- and post-prandial blood glucose recommendations and Hemoglobin A1c goals, diabetes diet, and exercise including blood glucose guidelines for exercising safely.    Portion Distortion:  -Group instruction provided by PowerPoint slides, verbal discussion, written materials, and food models to support subject matter. The instructor gives an explanation of serving size versus portion size, changes in portions sizes over the last 20 years, and what consists of a serving from each food group.   Stress Management:  -Group instruction provided by verbal instruction, video, and written materials to support subject matter.  Instructors review role of stress in heart disease and how to cope with stress positively.     Exercising on Your Own:  -Group instruction  provided by verbal instruction, power point, and written materials to support subject.  Instructors discuss benefits of exercise, components of exercise, frequency and intensity of exercise, and end points for exercise.  Also discuss use of nitroglycerin and activating EMS.  Review options of places to exercise outside of rehab.  Review guidelines for sex with heart disease.   Cardiac Drugs I:  -Group instruction provided by verbal instruction and written materials to support subject.  Instructor reviews cardiac drug classes: antiplatelets, anticoagulants, beta blockers, and statins.  Instructor discusses reasons, side effects, and lifestyle considerations for each drug class.   Cardiac Drugs II:  -Group instruction provided by verbal instruction and written materials to support subject.  Instructor reviews cardiac drug classes: angiotensin converting enzyme inhibitors (ACE-I), angiotensin II receptor blockers (ARBs), nitrates, and calcium channel blockers.  Instructor discusses reasons, side effects, and lifestyle considerations for each drug class.   Anatomy and Physiology of the Circulatory System:  Group verbal and written instruction and models provide basic cardiac anatomy and physiology, with the coronary electrical and arterial systems. Review of: AMI, Angina, Valve disease, Heart Failure, Peripheral Artery Disease, Cardiac Arrhythmia, Pacemakers, and the ICD.   Other Education:  -Group or individual verbal, written, or video instructions that support the educational goals of the cardiac rehab program.   Knowledge Questionnaire Score: Knowledge Questionnaire Score - 08/21/17 1547      Knowledge Questionnaire Score   Pre Score  22/24       Core Components/Risk Factors/Patient Goals at Admission: Personal Goals and Risk Factors at Admission - 08/21/17 1609      Core Components/Risk Factors/Patient Goals on Admission    Weight Management  Yes;Weight Loss    Intervention  Weight  Management: Provide education and appropriate resources to help participant work on and attain dietary goals.;Weight Management: Develop a combined nutrition and exercise program designed to reach desired caloric intake, while maintaining appropriate intake of nutrient and fiber, sodium and fats, and appropriate energy expenditure required for the weight goal.    Admit Weight  201 lb 15.1 oz (91.6 kg)    Goal Weight: Short Term  195 lb (88.5 kg)    Goal Weight: Long Term  190 lb (86.2 kg)    Expected Outcomes  Short Term: Continue to assess and modify interventions until short term weight is achieved;Weight Loss: Understanding of general recommendations for a balanced deficit meal plan, which promotes 1-2 lb weight loss per week  and includes a negative energy balance of [phone] kcal/d;Understanding recommendations for meals to include 15-35% energy as protein, 25-35% energy from fat, 35-60% energy from carbohydrates, less than 200mg  of dietary cholesterol, 20-35 gm of total fiber daily;Understanding of distribution of calorie intake throughout the day with the consumption of 4-5 meals/snacks;Long Term: Adherence to nutrition and physical activity/exercise program aimed toward attainment of established weight goal    Diabetes  Yes    Intervention  Provide education about proper nutrition, including hydration, and aerobic/resistive exercise prescription along with prescribed medications to achieve blood glucose in normal ranges: Fasting glucose 65-99 mg/dL;Provide education about signs/symptoms and action to take for hypo/hyperglycemia.    Expected Outcomes  Short Term: Participant verbalizes understanding of the signs/symptoms and immediate care of hyper/hypoglycemia, proper foot care and importance of medication, aerobic/resistive exercise and nutrition plan for blood glucose control.;Long Term: Attainment of HbA1C < 7%.    Lipids  Yes    Intervention  Provide education and support for participant on  nutrition & aerobic/resistive exercise along with prescribed medications to achieve LDL 70mg , HDL >40mg .    Expected Outcomes  Short Term: Participant states understanding of desired cholesterol values and is compliant with medications prescribed. Participant is following exercise prescription and nutrition guidelines.;Long Term: Cholesterol controlled with medications as prescribed, with individualized exercise RX and with personalized nutrition plan. Value goals: LDL < 70mg , HDL > 40 mg.       Core Components/Risk Factors/Patient Goals Review:    Core Components/Risk Factors/Patient Goals at Discharge (Final Review):    ITP Comments: ITP Comments    Row Name 08/21/17 1342 08/21/17 1549         ITP Comments  Dr. Fransico Him, Medical Director  Dr. Fransico Him Medical Director          Comments: Eduard Clos attended orientation from 1338 to 1505 to review rules and guidelines for program. Completed 6 minute walk test, Intitial ITP, and exercise prescription.  VSS. Telemetry-Sinus Rhythm with intermittent PVC's  .  Asymptomatic. Dr Bonney Roussel office called and notified about today's PVC's. Will fax exercise flow sheets to Dr. Bonney Roussel office for review with today's ECG tracings Charlie left cardiac rehab without complaints.Barnet Pall, RN,BSN 08/21/2017 4:39 PM

## 2017-08-31 ENCOUNTER — Encounter (HOSPITAL_COMMUNITY)
Admission: RE | Admit: 2017-08-31 | Discharge: 2017-08-31 | Disposition: A | Discharge status: Home / Self Care | Payer: Medicare HMO | Source: Ambulatory Visit | Attending: Cardiology | Admitting: Cardiology

## 2017-08-31 DIAGNOSIS — Z955 Presence of coronary angioplasty implant and graft: Secondary | ICD-10-CM

## 2017-08-31 LAB — GLUCOSE, CAPILLARY
GLUCOSE-CAPILLARY: 143 mg/dL — AB (ref 65–99)
Glucose-Capillary: 83 mg/dL (ref 65–99)
Glucose-Capillary: 104 mg/dL — ABNORMAL HIGH (ref 65–99)

## 2017-08-31 NOTE — Progress Notes (Signed)
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  alfuzosin (UROXATRAL) 10 MG 24 hr tablet, Take 10 mg by mouth daily with supper., Disp: , Rfl:  .  allopurinol (ZYLOPRIM) 300 MG tablet, Take 300 mg by mouth daily with lunch., Disp: , Rfl:  .  aspirin EC 81 MG tablet, Take 81 mg by mouth daily with breakfast., Disp: , Rfl:  .  clopidogrel (PLAVIX) 75 MG tablet, Take 1 tablet (75 mg total) by mouth daily with breakfast., Disp: 90 tablet, Rfl: 2 .  isosorbide mononitrate (IMDUR) 30 MG 24 hr tablet, Take 30 mg by mouth daily., Disp: , Rfl:  .  metFORMIN (GLUCOPHAGE-XR) 500 MG 24 hr tablet, Take 1,000 mg by mouth daily with supper., Disp: , Rfl:  .  metoprolol succinate (TOPROL-XL) 25 MG 24 hr tablet, Take 25 mg by mouth daily with breakfast., Disp: , Rfl:  .  nitroGLYCERIN (NITROSTAT) 0.4 MG SL tablet, Place 0.4 mg under the tongue every 5 (five) minutes x 3 doses as needed. For chest pain., Disp: , Rfl:  .  ramipril (ALTACE) 2.5 MG capsule, Take 5 mg daily with breakfast by mouth. , Disp: , Rfl:  .  REPATHA SURECLICK 295 MG/ML SOAJ, Inject 140 mg into the skin every 14 (fourteen) days., Disp: , Rfl:   Past Medical History: Past Medical History:  Diagnosis Date  . Abnormal stress test 05/18/2017  . Arthritis    "right knee" (05/24/2017)  . Chest pain   . Coronary artery disease   . Gout   . High cholesterol   .  History of kidney stones   . Hypertension   . Myocardial infarction ([place]) 04/2017  . Pneumonia 1990s X 1  . Statin intolerance    Used >2 different statins  . Type II diabetes mellitus ([facility])     Tobacco Use: Social History   Tobacco Use  Smoking Status Former Smoker  . Packs/day: 1.00  . Years: 30.00  . Pack years: 30.00  . Types: Cigarettes  . Last attempt to quit: 2003  . Years since quitting: 15.9  Smokeless Tobacco Never Used    Labs: Recent Review Flowsheet Data    There is no flowsheet data to display.      Capillary Blood Glucose: Lab Results  Component Value Date   GLUCAP 83 08/31/2017   GLUCAP 143 (H) 08/31/2017   GLUCAP 134 (H) 05/25/2017   GLUCAP 134 (H) 05/24/2017   GLUCAP 148 (H) 05/24/2017     Exercise Target Goals:    Exercise Program Goal: Individual exercise prescription set with THRR, safety & activity barriers. Participant demonstrates ability to understand and report RPE using BORG scale, to self-measure pulse accurately, and to acknowledge the importance of the exercise prescription.  Exercise Prescription Goal: Starting with aerobic activity 30 plus minutes a day, 3 days per week for initial exercise prescription. Provide home exercise prescription and guidelines that  participant acknowledges understanding prior to discharge.  Activity Barriers & Risk Stratification: Activity Barriers & Cardiac Risk Stratification - 08/21/17 1554      Activity Barriers & Cardiac Risk Stratification   Activity Barriers  Other (comment)    Comments  R knee pain    Cardiac Risk Stratification  High       6 Minute Walk: 6 Minute Walk    Row Name 08/21/17 1537 08/21/17 1540 08/21/17 1547     6 Minute Walk   Phase  Initial  -  Initial   Distance  1671 feet  -  1671 feet   Walk Time  6 minutes  -  6 minutes   # of Rest Breaks  0  -  0   MPH  3.16  -  3.16   METS  3.45  -  3.45   RPE  12  -  12   Perceived Dyspnea   0  -  0   VO2 Peak  12.09  -   12.09   Symptoms  -  No  -   Resting HR  74 bpm  -  74 bpm   Resting BP  104/62  -  104/62   Resting Oxygen Saturation   98 %  -  98 %   Exercise Oxygen Saturation  during 6 min walk  96 %  -  96 %   Max Ex. HR  99 bpm  -  99 bpm   Max Ex. BP  122/62  -  122/62   2 Minute Post BP  128/62  -  128/82      Oxygen Initial Assessment:   Oxygen Re-Evaluation:   Oxygen Discharge (Final Oxygen Re-Evaluation):   Initial Exercise Prescription: Initial Exercise Prescription - 08/21/17 1500      Date of Initial Exercise RX and Referring Provider   Date  08/21/17    Referring Provider  Shirlee More MD      Treadmill   MPH  2.7    Grade  0    Minutes  10    METs  3.07      Bike   Level  0.8    Minutes  10    METs  2.68      NuStep   Level  3    SPM  80    Minutes  10    METs  2.5      Prescription Details   Frequency (times per week)  3    Duration  Progress to 30 minutes of continuous aerobic without signs/symptoms of physical distress      Intensity   THRR 40-80% of Max Heartrate  60-120    Ratings of Perceived Exertion  11-13    Perceived Dyspnea  0-4      Progression   Progression  Continue progressive overload as per policy without signs/symptoms or physical distress.      Resistance Training   Training Prescription  Yes    Weight  3lbs    Reps  10-15       Perform Capillary Blood Glucose checks as needed.  Exercise Prescription Changes:   Exercise Comments:   Exercise Goals and Review: Exercise Goals    Row Name 08/21/17 1351             Exercise Goals   Increase Physical Activity  Yes       Intervention  Provide advice, education, support and counseling about physical activity/exercise needs.;Develop an  individualized exercise prescription for aerobic and resistive training based on initial evaluation findings, risk stratification, comorbidities and participant's personal goals.       Expected Outcomes  Achievement of increased  cardiorespiratory fitness and enhanced flexibility, muscular endurance and strength shown through measurements of functional capacity and personal statement of participant.       Increase Strength and Stamina  Yes       Intervention  Provide advice, education, support and counseling about physical activity/exercise needs.;Develop an individualized exercise prescription for aerobic and resistive training based on initial evaluation findings, risk stratification, comorbidities and participant's personal goals.       Expected Outcomes  Achievement of increased cardiorespiratory fitness and enhanced flexibility, muscular endurance and strength shown through measurements of functional capacity and personal statement of participant.       Able to understand and use rate of perceived exertion (RPE) scale  Yes       Intervention  Provide education and explanation on how to use RPE scale       Expected Outcomes  Short Term: Able to use RPE daily in rehab to express subjective intensity level;Long Term:  Able to use RPE to guide intensity level when exercising independently       Knowledge and understanding of Target Heart Rate Range (THRR)  Yes       Intervention  Provide education and explanation of THRR including how the numbers were predicted and where they are located for reference       Expected Outcomes  Short Term: Able to state/look up THRR;Long Term: Able to use THRR to govern intensity when exercising independently;Short Term: Able to use daily as guideline for intensity in rehab       Able to check pulse independently  Yes       Intervention  Provide education and demonstration on how to check pulse in carotid and radial arteries.;Review the importance of being able to check your own pulse for safety during independent exercise       Expected Outcomes  Short Term: Able to explain why pulse checking is important during independent exercise;Long Term: Able to check pulse independently and accurately        Understanding of Exercise Prescription  Yes       Intervention  Provide education, explanation, and written materials on patient's individual exercise prescription       Expected Outcomes  Short Term: Able to explain program exercise prescription;Long Term: Able to explain home exercise prescription to exercise independently          Exercise Goals Re-Evaluation :    Discharge Exercise Prescription (Final Exercise Prescription Changes):   Nutrition:  Target Goals: Understanding of nutrition guidelines, daily intake of sodium 1500mg , cholesterol 200mg , calories 30% from fat and 7% or less from saturated fats, daily to have 5 or more servings of fruits and vegetables.  Biometrics: Pre Biometrics - 08/21/17 1631      Pre Biometrics   Height  5' 9.75" (1.772 m)    Weight  201 lb 15.1 oz (91.6 kg)    Waist Circumference  41 inches    Hip Circumference  41.75 inches    Waist to Hip Ratio  0.98 %    BMI (Calculated)  29.17    Triceps Skinfold  17 mm    % Body Fat  29 %    Grip Strength  39 kg    Flexibility  13 in    Single Leg Stand  24.03 seconds  Nutrition Therapy Plan and Nutrition Goals: Nutrition Therapy & Goals - 08/21/17 1509      Nutrition Therapy   Diet  Carb Modified, Heart Healthy      Personal Nutrition Goals   Nutrition Goal  Pt to identify food quantities necessary to achieve weight loss of 6-24 lb at graduation from cardiac rehab. Goal wt of 185 lb desired.     Personal Goal #2  Pt to identify and limit food sources of saturated fat, trans fat, and sodium    Personal Goal #3  CBG concentrations in the normal range or as close to normal as is safely possible.      Intervention Plan   Intervention  Prescribe, educate and counsel regarding individualized specific dietary modifications aiming towards targeted core components such as weight, hypertension, lipid management, diabetes, heart failure and other comorbidities.    Expected Outcomes  Short Term  Goal: Understand basic principles of dietary content, such as calories, fat, sodium, cholesterol and nutrients.;Long Term Goal: Adherence to prescribed nutrition plan.       Nutrition Discharge: Nutrition Scores: Nutrition Assessments - 08/21/17 1509      MEDFICTS Scores   Pre Score  83       Nutrition Goals Re-Evaluation:   Nutrition Goals Re-Evaluation:   Nutrition Goals Discharge (Final Nutrition Goals Re-Evaluation):   Psychosocial: Target Goals: Acknowledge presence or absence of significant depression and/or stress, maximize coping skills, provide positive support system. Participant is able to verbalize types and ability to use techniques and skills needed for reducing stress and depression.  Initial Review & Psychosocial Screening: Initial Psych Review & Screening - 08/21/17 1413      Initial Review   Current issues with  None Identified      Family Dynamics   Good Support System?  Yes spouse, cousin, church family    Comments  no psychosocial needs identified, no interventions necessary      Barriers   Psychosocial barriers to participate in program  There are no identifiable barriers or psychosocial needs.      Screening Interventions   Interventions  Encouraged to exercise;Provide feedback about the scores to participant       Quality of Life Scores: Quality of Life - 08/21/17 1451      Quality of Life Scores   Health/Function Pre  18.47 %    Socioeconomic Pre  23.56 %    Psych/Spiritual Pre  19.79 %    Family Pre  21.8 %    GLOBAL Pre  20.37 %       PHQ-9: Recent Review Flowsheet Data    Depression screen [facility] 2/9 08/31/2017   Decreased Interest 0   Down, Depressed, Hopeless 0   PHQ - 2 Score 0     Interpretation of Total Score  Total Score Depression Severity:  1-4 = Minimal depression, 5-9 = Mild depression, 10-14 = Moderate depression, 15-19 = Moderately severe depression, 20-27 = Severe depression   Psychosocial Evaluation and  Intervention:   Psychosocial Re-Evaluation:   Psychosocial Discharge (Final Psychosocial Re-Evaluation):   Vocational Rehabilitation: Provide vocational rehab assistance to qualifying candidates.   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 Participant will state understanding/return demonstration of topics presented.  Learning Barriers/Preferences: Learning Barriers/Preferences - 08/21/17 1552      Learning Barriers/Preferences   Learning Barriers  Sight    Learning Preferences  Written Material       Education Topics: Count Your Pulse:  -Group instruction provided by verbal instruction, demonstration, patient participation and written materials to support subject.  Instructors address importance of being able to find your pulse and how to count your pulse when at home without a heart monitor.  Patients get hands on experience counting their pulse with staff help and individually.   Heart Attack, Angina, and Risk Factor Modification:  -Group instruction provided by verbal instruction, video, and written materials to support subject.  Instructors address signs and symptoms of angina and heart attacks.    Also discuss risk factors for heart disease and how to make changes to improve heart health risk factors.   Functional Fitness:  -Group instruction provided by verbal instruction, demonstration, patient participation, and written materials to support subject.  Instructors address safety measures for doing things around the house.  Discuss how to get up and down off the floor, how to pick things up properly, how to safely get out of a chair without assistance, and balance training.   Meditation  and Mindfulness:  -Group instruction provided by verbal instruction, patient participation, and written materials to support subject.  Instructor addresses importance of mindfulness and meditation practice to help reduce stress and improve awareness.  Instructor also leads participants through a meditation exercise.    Stretching for Flexibility and Mobility:  -Group instruction provided by verbal instruction, patient participation, and written materials to support subject.  Instructors lead participants through series of stretches that are designed to increase flexibility thus improving mobility.  These stretches are additional exercise for major muscle groups that are typically performed during regular warm up and cool down.   Hands Only CPR:  -Group verbal, video, and participation provides a basic overview of AHA guidelines for community CPR. Role-play of emergencies allow participants the opportunity to practice calling for help and chest compression technique with discussion of AED use.   Hypertension: -Group verbal and written instruction that provides a basic overview of hypertension including the most recent diagnostic guidelines, risk factor reduction with self-care instructions and medication management.    Nutrition I class: Heart Healthy Eating:  -Group instruction provided by PowerPoint slides, verbal discussion, and written materials to support subject matter. The instructor gives an explanation and review of the Therapeutic Lifestyle Changes diet recommendations, which includes a discussion on lipid goals, dietary fat, sodium, fiber, plant stanol/sterol esters, sugar, and the components of a well-balanced, healthy diet.   CARDIAC REHAB PHASE II ORIENTATION from 08/21/2017 in [facility]  Date  08/21/17 [place] handouts given]  Educator  RD  Instruction Review Code  Not applicable      Nutrition II class: Lifestyle Skills:  -Group instruction  provided by PowerPoint slides, verbal discussion, and written materials to support subject matter. The instructor gives an explanation and review of label reading, grocery shopping for heart health, heart healthy recipe modifications, and ways to make healthier choices when eating out.   CARDIAC REHAB PHASE II ORIENTATION from 08/21/2017 in [facility]  Date  08/21/17 [place] handouts given]  Educator  RD  Instruction Review Code  Not applicable      Diabetes Question & Answer:  -Group instruction provided by PowerPoint slides, verbal discussion, and written materials to support subject matter. The instructor  gives an explanation and review of diabetes co-morbidities, pre- and post-prandial blood glucose goals, pre-exercise blood glucose goals, signs, symptoms, and treatment of hypoglycemia and hyperglycemia, and foot care basics.   Diabetes Blitz:  -Group instruction provided by PowerPoint slides, verbal discussion, and written materials to support subject matter. The instructor gives an explanation and review of the physiology behind type 1 and type 2 diabetes, diabetes medications and rational behind using different medications, pre- and post-prandial blood glucose recommendations and Hemoglobin A1c goals, diabetes diet, and exercise including blood glucose guidelines for exercising safely.    Portion Distortion:  -Group instruction provided by PowerPoint slides, verbal discussion, written materials, and food models to support subject matter. The instructor gives an explanation of serving size versus portion size, changes in portions sizes over the last 20 years, and what consists of a serving from each food group.   Stress Management:  -Group instruction provided by verbal instruction, video, and written materials to support subject matter.  Instructors review role of stress in heart disease and how to cope with stress positively.     Exercising on Your Own:   -Group instruction provided by verbal instruction, power point, and written materials to support subject.  Instructors discuss benefits of exercise, components of exercise, frequency and intensity of exercise, and end points for exercise.  Also discuss use of nitroglycerin and activating EMS.  Review options of places to exercise outside of rehab.  Review guidelines for sex with heart disease.   Cardiac Drugs I:  -Group instruction provided by verbal instruction and written materials to support subject.  Instructor reviews cardiac drug classes: antiplatelets, anticoagulants, beta blockers, and statins.  Instructor discusses reasons, side effects, and lifestyle considerations for each drug class.   Cardiac Drugs II:  -Group instruction provided by verbal instruction and written materials to support subject.  Instructor reviews cardiac drug classes: angiotensin converting enzyme inhibitors (ACE-I), angiotensin II receptor blockers (ARBs), nitrates, and calcium channel blockers.  Instructor discusses reasons, side effects, and lifestyle considerations for each drug class.   Anatomy and Physiology of the Circulatory System:  Group verbal and written instruction and models provide basic cardiac anatomy and physiology, with the coronary electrical and arterial systems. Review of: AMI, Angina, Valve disease, Heart Failure, Peripheral Artery Disease, Cardiac Arrhythmia, Pacemakers, and the ICD.   Other Education:  -Group or individual verbal, written, or video instructions that support the educational goals of the cardiac rehab program.   Knowledge Questionnaire Score: Knowledge Questionnaire Score - 08/21/17 1547      Knowledge Questionnaire Score   Pre Score  22/24       Core Components/Risk Factors/Patient Goals at Admission: Personal Goals and Risk Factors at Admission - 08/21/17 1609      Core Components/Risk Factors/Patient Goals on Admission    Weight Management  Yes;Weight Loss     Intervention  Weight Management: Provide education and appropriate resources to help participant work on and attain dietary goals.;Weight Management: Develop a combined nutrition and exercise program designed to reach desired caloric intake, while maintaining appropriate intake of nutrient and fiber, sodium and fats, and appropriate energy expenditure required for the weight goal.    Admit Weight  201 lb 15.1 oz (91.6 kg)    Goal Weight: Short Term  195 lb (88.5 kg)    Goal Weight: Long Term  190 lb (86.2 kg)    Expected Outcomes  Short Term: Continue to assess and modify interventions until short term weight is achieved;Weight Loss: Understanding of  general recommendations for a balanced deficit meal plan, which promotes 1-2 lb weight loss per week and includes a negative energy balance of [phone] kcal/d;Understanding recommendations for meals to include 15-35% energy as protein, 25-35% energy from fat, 35-60% energy from carbohydrates, less than 200mg  of dietary cholesterol, 20-35 gm of total fiber daily;Understanding of distribution of calorie intake throughout the day with the consumption of 4-5 meals/snacks;Long Term: Adherence to nutrition and physical activity/exercise program aimed toward attainment of established weight goal    Diabetes  Yes    Intervention  Provide education about proper nutrition, including hydration, and aerobic/resistive exercise prescription along with prescribed medications to achieve blood glucose in normal ranges: Fasting glucose 65-99 mg/dL;Provide education about signs/symptoms and action to take for hypo/hyperglycemia.    Expected Outcomes  Short Term: Participant verbalizes understanding of the signs/symptoms and immediate care of hyper/hypoglycemia, proper foot care and importance of medication, aerobic/resistive exercise and nutrition plan for blood glucose control.;Long Term: Attainment of HbA1C < 7%.    Lipids  Yes    Intervention  Provide education and support for  participant on nutrition & aerobic/resistive exercise along with prescribed medications to achieve LDL 70mg , HDL >40mg .    Expected Outcomes  Short Term: Participant states understanding of desired cholesterol values and is compliant with medications prescribed. Participant is following exercise prescription and nutrition guidelines.;Long Term: Cholesterol controlled with medications as prescribed, with individualized exercise RX and with personalized nutrition plan. Value goals: LDL < 70mg , HDL > 40 mg.       Core Components/Risk Factors/Patient Goals Review:    Core Components/Risk Factors/Patient Goals at Discharge (Final Review):    ITP Comments: ITP Comments    Row Name 08/21/17 1342 08/21/17 1549         ITP Comments  Dr. Fransico Him, Medical Director  Dr. Fransico Him Medical Director          Comments: See ITP comments.Barnet Pall, RN,BSN 08/31/2017 1:15 PM

## 2017-08-31 NOTE — Progress Notes (Signed)
Daily Session Note  Patient Details  Name: Nicholas Maxwell MRN: [id number] Date of Birth: [birth year] Referring Provider:     CARDIAC REHAB PHASE II ORIENTATION from 08/21/2017 in [place]  Referring Provider  Shirlee More MD      Encounter Date: 08/31/2017  Check In: Session Check In - 08/31/17 1152      Check-In   Location  [facility]    Staff Present  Su Hilt, MS, ACSM RCEP, Exercise Physiologist;Olinty Camden, MS, ACSM CEP, Exercise Physiologist;Annedrea Stackhouse, RN, MHA;Maria Whitaker, RN, BSN;Joann Rion, RN, BSN    Supervising physician immediately available to respond to emergencies  [facility] immediately available    Physician(s)  Dr. Nevada Crane     Medication changes reported      No    Fall or balance concerns reported     No    Tobacco Cessation  No Change    Warm-up and Cool-down  Performed as group-led instruction    Resistance Training Performed  No    VAD Patient?  No      Pain Assessment   Currently in Pain?  No/denies       Capillary Blood Glucose: Results for orders placed or performed during the hospital encounter of 08/31/17 (from the past 24 hour(s))  Glucose, capillary     Status: Abnormal   Collection Time: 08/31/17 11:25 AM  Result Value Ref Range   Glucose-Capillary 143 (H) 65 - 99 mg/dL  Glucose, capillary     Status: None   Collection Time: 08/31/17 12:10 PM  Result Value Ref Range   Glucose-Capillary 83 65 - 99 mg/dL      Social History   Tobacco Use  Smoking Status Former Smoker  . Packs/day: 1.00  . Years: 30.00  . Pack years: 30.00  . Types: Cigarettes  . Last attempt to quit: 2003  . Years since quitting: 15.9  Smokeless Tobacco Never Used    Goals Met:  Exercise tolerated well  Goals Unmet:  Not Applicable  Comments: Pt started cardiac rehab today.  Pt tolerated light exercise without difficulty. VSS, telemetry-Sinus Rhtyhm, asymptomatic.   Medication list reconciled. Pt denies barriers to medicaiton compliance.  PSYCHOSOCIAL ASSESSMENT:  PHQ-0. Pt exhibits positive coping skills, hopeful outlook with supportive family. No psychosocial needs identified at this time, no psychosocial interventions necessary.    Pt enjoys exercising at the [facility]   Pt oriented to exercise equipment and routine.    Understanding verbalized. Post exercise CBG 83. Patient given lemonade and graham crackers. Recheck CBG 104.Barnet Pall, RN,BSN 08/31/2017 1:12 PM   Dr. Fransico Him is Medical Director for Cardiac Rehab at [facility].

## 2017-09-03 ENCOUNTER — Encounter (HOSPITAL_COMMUNITY)
Admission: RE | Admit: 2017-09-03 | Discharge: 2017-09-03 | Disposition: A | Discharge status: Home / Self Care | Payer: Medicare HMO | Source: Ambulatory Visit | Attending: Cardiology | Admitting: Cardiology

## 2017-09-03 DIAGNOSIS — I10 Essential (primary) hypertension: Secondary | ICD-10-CM | POA: Insufficient documentation

## 2017-09-03 DIAGNOSIS — E119 Type 2 diabetes mellitus without complications: Secondary | ICD-10-CM | POA: Diagnosis not present

## 2017-09-03 DIAGNOSIS — I252 Old myocardial infarction: Secondary | ICD-10-CM | POA: Insufficient documentation

## 2017-09-03 DIAGNOSIS — Z79899 Other long term (current) drug therapy: Secondary | ICD-10-CM | POA: Diagnosis not present

## 2017-09-03 DIAGNOSIS — Z7982 Long term (current) use of aspirin: Secondary | ICD-10-CM | POA: Diagnosis not present

## 2017-09-03 DIAGNOSIS — Z955 Presence of coronary angioplasty implant and graft: Secondary | ICD-10-CM | POA: Diagnosis not present

## 2017-09-03 DIAGNOSIS — Z87891 Personal history of nicotine dependence: Secondary | ICD-10-CM | POA: Insufficient documentation

## 2017-09-03 DIAGNOSIS — Z7984 Long term (current) use of oral hypoglycemic drugs: Secondary | ICD-10-CM | POA: Diagnosis not present

## 2017-09-03 LAB — GLUCOSE, CAPILLARY
GLUCOSE-CAPILLARY: 120 mg/dL — AB (ref 65–99)
GLUCOSE-CAPILLARY: 129 mg/dL — AB (ref 65–99)

## 2017-09-07 ENCOUNTER — Encounter (HOSPITAL_COMMUNITY)
Admission: RE | Admit: 2017-09-07 | Discharge: 2017-09-07 | Disposition: A | Discharge status: Home / Self Care | Payer: Medicare HMO | Source: Ambulatory Visit | Attending: Cardiology | Admitting: Cardiology

## 2017-09-07 DIAGNOSIS — Z955 Presence of coronary angioplasty implant and graft: Secondary | ICD-10-CM

## 2017-09-07 LAB — GLUCOSE, CAPILLARY
Glucose-Capillary: 206 mg/dL — ABNORMAL HIGH (ref 65–99)
Glucose-Capillary: 119 mg/dL — ABNORMAL HIGH (ref 65–99)

## 2017-09-07 NOTE — Progress Notes (Signed)
I have reviewed a Home Exercise Prescription with Nicholas Maxwell . [name] is currently exercising at [facility] 2 days a week in a deep water aerobics class.  The patient was advised to walk 2-3 days a week for 30-45 minutes on days when he does not come to rehab or local [facility].  Nicholas Maxwell and I discussed how to progress their exercise prescription.  The patient stated that their goals were to increase his stamina.  The patient stated that they understand the exercise prescription.  We reviewed exercise guidelines, target heart rate during exercise, weather, endpoints for exercise, and goals.  Patient is encouraged to come to me with any questions. I will continue to follow up with the patient to assist them with progression and safety.    Carma Lair MS, ACSM CEP 09/07/2017 3:00 PM

## 2017-09-10 ENCOUNTER — Encounter (HOSPITAL_COMMUNITY): Payer: Medicare HMO

## 2017-09-14 ENCOUNTER — Encounter (HOSPITAL_COMMUNITY)
Admission: RE | Admit: 2017-09-14 | Discharge: 2017-09-14 | Disposition: A | Discharge status: Home / Self Care | Payer: Medicare HMO | Source: Ambulatory Visit | Attending: Cardiology | Admitting: Cardiology

## 2017-09-14 DIAGNOSIS — Z955 Presence of coronary angioplasty implant and graft: Secondary | ICD-10-CM

## 2017-09-17 ENCOUNTER — Encounter (HOSPITAL_COMMUNITY)
Admission: RE | Admit: 2017-09-17 | Discharge: 2017-09-17 | Disposition: A | Discharge status: Home / Self Care | Payer: Medicare HMO | Source: Ambulatory Visit | Attending: Cardiology | Admitting: Cardiology

## 2017-09-17 DIAGNOSIS — Z955 Presence of coronary angioplasty implant and graft: Secondary | ICD-10-CM | POA: Diagnosis not present

## 2017-09-21 ENCOUNTER — Encounter (HOSPITAL_COMMUNITY)
Admission: RE | Admit: 2017-09-21 | Discharge: 2017-09-21 | Disposition: A | Discharge status: Home / Self Care | Payer: Medicare HMO | Source: Ambulatory Visit | Attending: Cardiology | Admitting: Cardiology

## 2017-09-21 DIAGNOSIS — Z955 Presence of coronary angioplasty implant and graft: Secondary | ICD-10-CM | POA: Diagnosis not present

## 2017-09-27 ENCOUNTER — Encounter (HOSPITAL_COMMUNITY): Payer: Self-pay | Admitting: *Deleted

## 2017-09-27 DIAGNOSIS — Z955 Presence of coronary angioplasty implant and graft: Secondary | ICD-10-CM

## 2017-09-27 NOTE — Progress Notes (Signed)
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  alfuzosin (UROXATRAL) 10 MG 24 hr tablet, Take 10 mg by mouth daily with supper., Disp: , Rfl:  .  allopurinol (ZYLOPRIM) 300 MG tablet, Take 300 mg by mouth daily with lunch., Disp: , Rfl:  .  aspirin EC 81 MG tablet, Take 81 mg by mouth daily with breakfast., Disp: , Rfl:  .  clopidogrel (PLAVIX) 75 MG tablet, Take 1 tablet (75 mg total) by mouth daily with breakfast., Disp: 90 tablet, Rfl: 2 .  isosorbide mononitrate (IMDUR) 30 MG 24 hr tablet, Take 30 mg by mouth daily., Disp: , Rfl:  .  metFORMIN (GLUCOPHAGE-XR) 500 MG 24 hr tablet, Take 1,000 mg by mouth daily with supper., Disp: , Rfl:  .  metoprolol succinate (TOPROL-XL) 25 MG 24 hr tablet, Take 25 mg by mouth daily with breakfast., Disp: , Rfl:  .  nitroGLYCERIN (NITROSTAT) 0.4 MG SL tablet, Place 0.4 mg under the tongue every 5 (five) minutes x 3 doses as needed. For chest pain., Disp: , Rfl:  .  ramipril (ALTACE) 2.5 MG capsule, Take 5 mg daily with breakfast by mouth. , Disp: , Rfl:  .  REPATHA SURECLICK 782 MG/ML SOAJ, Inject 140 mg into the skin every 14 (fourteen) days., Disp: , Rfl:   Past Medical History: Past Medical History:  Diagnosis Date  . Abnormal stress test 05/18/2017  . Arthritis    "right knee" (05/24/2017)  . Chest pain   . Coronary artery disease   . Gout   . High cholesterol   .  History of kidney stones   . Hypertension   . Myocardial infarction ([place]) 04/2017  . Pneumonia 1990s X 1  . Statin intolerance    Used >2 different statins  . Type II diabetes mellitus ([facility])     Tobacco Use: Social History   Tobacco Use  Smoking Status Former Smoker  . Packs/day: 1.00  . Years: 30.00  . Pack years: 30.00  . Types: Cigarettes  . Last attempt to quit: 2003  . Years since quitting: 15.9  Smokeless Tobacco Never Used    Labs: Recent Review Flowsheet Data    There is no flowsheet data to display.      Capillary Blood Glucose: Lab Results  Component Value Date   GLUCAP 119 (H) 09/07/2017   GLUCAP 206 (H) 09/07/2017   GLUCAP 129 (H) 09/03/2017   GLUCAP 120 (H) 09/03/2017   GLUCAP 104 (H) 08/31/2017     Exercise Target Goals:    Exercise Program Goal: Individual exercise prescription set with THRR, safety & activity barriers. Participant demonstrates ability to understand and report RPE using BORG scale, to self-measure pulse accurately, and to acknowledge the importance of the exercise prescription.  Exercise Prescription Goal: Starting with aerobic activity 30 plus minutes a day, 3 days per week for initial exercise prescription. Provide home exercise prescription and guidelines  that participant acknowledges understanding prior to discharge.  Activity Barriers & Risk Stratification: Activity Barriers & Cardiac Risk Stratification - 08/21/17 1554      Activity Barriers & Cardiac Risk Stratification   Activity Barriers  Other (comment)    Comments  R knee pain    Cardiac Risk Stratification  High       6 Minute Walk: 6 Minute Walk    Row Name 08/21/17 1537 08/21/17 1540 08/21/17 1547     6 Minute Walk   Phase  Initial  -  Initial   Distance  1671 feet  -  1671 feet   Walk Time  6 minutes  -  6 minutes   # of Rest Breaks  0  -  0   MPH  3.16  -  3.16   METS  3.45  -  3.45   RPE  12  -  12   Perceived Dyspnea   0  -  0   VO2 Peak  12.09   -  12.09   Symptoms  -  No  -   Resting HR  74 bpm  -  74 bpm   Resting BP  104/62  -  104/62   Resting Oxygen Saturation   98 %  -  98 %   Exercise Oxygen Saturation  during 6 min walk  96 %  -  96 %   Max Ex. HR  99 bpm  -  99 bpm   Max Ex. BP  122/62  -  122/62   2 Minute Post BP  128/62  -  128/82      Oxygen Initial Assessment:   Oxygen Re-Evaluation:   Oxygen Discharge (Final Oxygen Re-Evaluation):   Initial Exercise Prescription: Initial Exercise Prescription - 08/21/17 1500      Date of Initial Exercise RX and Referring Provider   Date  08/21/17    Referring Provider  Shirlee More MD      Treadmill   MPH  2.7    Grade  0    Minutes  10    METs  3.07      Bike   Level  0.8    Minutes  10    METs  2.68      NuStep   Level  3    SPM  80    Minutes  10    METs  2.5      Prescription Details   Frequency (times per week)  3    Duration  Progress to 30 minutes of continuous aerobic without signs/symptoms of physical distress      Intensity   THRR 40-80% of Max Heartrate  60-120    Ratings of Perceived Exertion  11-13    Perceived Dyspnea  0-4      Progression   Progression  Continue progressive overload as per policy without signs/symptoms or physical distress.      Resistance Training   Training Prescription  Yes    Weight  3lbs    Reps  10-15       Perform Capillary Blood Glucose checks as needed.  Exercise Prescription Changes: Exercise Prescription Changes    Row Name 08/31/17 1300 09/07/17 1422 09/17/17 1420         Response to Exercise   Blood Pressure (Admit)  110/62  120/70  124/70     Blood Pressure (Exercise)  128/76  132/78  134/74     Blood Pressure (Exit)  120/62  112/70  112/74     Heart Rate (Admit)  71 bpm  72 bpm  80 bpm     Heart Rate (Exercise)  92 bpm  99 bpm  117 bpm     Heart Rate (Exit)  91 bpm  72 bpm  80 bpm     Rating of Perceived Exertion (Exercise)  11  11  12      Symptoms  -  none  none     Duration   Continue with 30 min of aerobic exercise without signs/symptoms of physical distress.  Continue with 30 min of aerobic exercise without signs/symptoms of physical distress.  Continue with 30 min of aerobic exercise without signs/symptoms of physical distress.     Intensity  THRR unchanged  THRR unchanged  THRR unchanged       Progression   Progression  Continue to progress workloads to maintain intensity without signs/symptoms of physical distress.  Continue to progress workloads to maintain intensity without signs/symptoms of physical distress.  Continue to progress workloads to maintain intensity without signs/symptoms of physical distress.     Average METs  2.7  2.9  3.3       Resistance Training   Training Prescription  Yes  Yes  Yes     Weight  3lbs  4lbs  4lbs     Reps  10-15  10-15  10-15     Time  -  10 Minutes  10 Minutes       Treadmill   MPH  2.7  2.7  2.7     Grade  0  0  0     Minutes  10  10  10      METs  3.07  3.07  3.07       Bike   Level  0.8  0.8  1.3     Minutes  10  10  10      METs  2.68  2.68  3.67       NuStep   Level  4  4  5      SPM  80  80  85     Minutes  10  10  10      METs  2.6  3.2  3.2       Home Exercise Plan   Plans to continue exercise at  -  [employer] (comment) [place] (comment) [employer]     Frequency  -  Add 2 additional days to program exercise sessions.  Add 2 additional days to program exercise sessions.     Initial Home Exercises Provided  -  09/07/17  09/07/17        Exercise Comments: Exercise Comments    Row Name 08/31/17 1344 09/07/17 1457 09/18/17 1429       Exercise Comments  Today was the patient first day. Patient was oriented to exercise equipment. Understands RPE scale. Will cont. to monitor and progress as able.   Reviewed Home Exercise Plan with patient today. Pt plans to walk and continue deep water aerobics at local [employer]. Will continue to monitor and progress pt.   Reviewed METs and goals. Pt is  making great progress in cardiac rehab. Pt's activity will be progressed based on signs/symptoms of physical distress and exercise/activity tolerance.         Exercise Goals and Review: Exercise Goals    Row Name 08/21/17 1351             Exercise Goals  Increase Physical Activity  Yes       Intervention  Provide advice, education, support and counseling about physical activity/exercise needs.;Develop an individualized exercise prescription for aerobic and resistive training based on initial evaluation findings, risk stratification, comorbidities and participant's personal goals.       Expected Outcomes  Achievement of increased cardiorespiratory fitness and enhanced flexibility, muscular endurance and strength shown through measurements of functional capacity and personal statement of participant.       Increase Strength and Stamina  Yes       Intervention  Provide advice, education, support and counseling about physical activity/exercise needs.;Develop an individualized exercise prescription for aerobic and resistive training based on initial evaluation findings, risk stratification, comorbidities and participant's personal goals.       Expected Outcomes  Achievement of increased cardiorespiratory fitness and enhanced flexibility, muscular endurance and strength shown through measurements of functional capacity and personal statement of participant.       Able to understand and use rate of perceived exertion (RPE) scale  Yes       Intervention  Provide education and explanation on how to use RPE scale       Expected Outcomes  Short Term: Able to use RPE daily in rehab to express subjective intensity level;Long Term:  Able to use RPE to guide intensity level when exercising independently       Knowledge and understanding of Target Heart Rate Range (THRR)  Yes       Intervention  Provide education and explanation of THRR including how the numbers were predicted and where they are located for  reference       Expected Outcomes  Short Term: Able to state/look up THRR;Long Term: Able to use THRR to govern intensity when exercising independently;Short Term: Able to use daily as guideline for intensity in rehab       Able to check pulse independently  Yes       Intervention  Provide education and demonstration on how to check pulse in carotid and radial arteries.;Review the importance of being able to check your own pulse for safety during independent exercise       Expected Outcomes  Short Term: Able to explain why pulse checking is important during independent exercise;Long Term: Able to check pulse independently and accurately       Understanding of Exercise Prescription  Yes       Intervention  Provide education, explanation, and written materials on patient's individual exercise prescription       Expected Outcomes  Short Term: Able to explain program exercise prescription;Long Term: Able to explain home exercise prescription to exercise independently          Exercise Goals Re-Evaluation : Exercise Goals Re-Evaluation    Row Name 08/31/17 1345 09/18/17 1428           Exercise Goal Re-Evaluation   Exercise Goals Review  Able to understand and use rate of perceived exertion (RPE) scale;Increase Physical Activity  Increase Physical Activity;Able to understand and use rate of perceived exertion (RPE) scale;Knowledge and understanding of Target Heart Rate Range (THRR);Understanding of Exercise Prescription;Increase Strength and Stamina;Able to check pulse independently      Comments  Patient was originally prescribed Level 3 on the Stepper--was able to work at a level of 4. States that he understands RPE scale.  Pt is making great progress in cardiac rehab. Pt tolerates WL increases very well and feels as though he is getting stronger.  Expected Outcomes  Through exercise at rehab and at home, patient will increase strength, stamina, and physical capacity.   Through exercise at rehab  and at home, patient will increase strength, stamina, and physical capacity.           Discharge Exercise Prescription (Final Exercise Prescription Changes): Exercise Prescription Changes - 09/17/17 1420      Response to Exercise   Blood Pressure (Admit)  124/70    Blood Pressure (Exercise)  134/74    Blood Pressure (Exit)  112/74    Heart Rate (Admit)  80 bpm    Heart Rate (Exercise)  117 bpm    Heart Rate (Exit)  80 bpm    Rating of Perceived Exertion (Exercise)  12    Symptoms  none    Duration  Continue with 30 min of aerobic exercise without signs/symptoms of physical distress.    Intensity  THRR unchanged      Progression   Progression  Continue to progress workloads to maintain intensity without signs/symptoms of physical distress.    Average METs  3.3      Resistance Training   Training Prescription  Yes    Weight  4lbs    Reps  10-15    Time  10 Minutes      Treadmill   MPH  2.7    Grade  0    Minutes  10    METs  3.07      Bike   Level  1.3    Minutes  10    METs  3.67      NuStep   Level  5    SPM  85    Minutes  10    METs  3.2      Home Exercise Plan   Plans to continue exercise at  [employer] (comment) [employer]    Frequency  Add 2 additional days to program exercise sessions.    Initial Home Exercises Provided  09/07/17       Nutrition:  Target Goals: Understanding of nutrition guidelines, daily intake of sodium 1500mg , cholesterol 200mg , calories 30% from fat and 7% or less from saturated fats, daily to have 5 or more servings of fruits and vegetables.  Biometrics: Pre Biometrics - 08/21/17 1631      Pre Biometrics   Height  5' 9.75" (1.772 m)    Weight  201 lb 15.1 oz (91.6 kg)    Waist Circumference  41 inches    Hip Circumference  41.75 inches    Waist to Hip Ratio  0.98 %    BMI (Calculated)  29.17    Triceps Skinfold  17 mm    % Body Fat  29 %    Grip Strength  39 kg    Flexibility  13 in    Single Leg Stand  24.03  seconds        Nutrition Therapy Plan and Nutrition Goals: Nutrition Therapy & Goals - 08/21/17 1509      Nutrition Therapy   Diet  Carb Modified, Heart Healthy      Personal Nutrition Goals   Nutrition Goal  Pt to identify food quantities necessary to achieve weight loss of 6-24 lb at graduation from cardiac rehab. Goal wt of 185 lb desired.     Personal Goal #2  Pt to identify and limit food sources of saturated fat, trans fat, and sodium    Personal Goal #3  CBG concentrations in the normal range or  as close to normal as is safely possible.      Intervention Plan   Intervention  Prescribe, educate and counsel regarding individualized specific dietary modifications aiming towards targeted core components such as weight, hypertension, lipid management, diabetes, heart failure and other comorbidities.    Expected Outcomes  Short Term Goal: Understand basic principles of dietary content, such as calories, fat, sodium, cholesterol and nutrients.;Long Term Goal: Adherence to prescribed nutrition plan.       Nutrition Discharge: Nutrition Scores: Nutrition Assessments - 08/21/17 1509      MEDFICTS Scores   Pre Score  83       Nutrition Goals Re-Evaluation:   Nutrition Goals Re-Evaluation:   Nutrition Goals Discharge (Final Nutrition Goals Re-Evaluation):   Psychosocial: Target Goals: Acknowledge presence or absence of significant depression and/or stress, maximize coping skills, provide positive support system. Participant is able to verbalize types and ability to use techniques and skills needed for reducing stress and depression.  Initial Review & Psychosocial Screening: Initial Psych Review & Screening - 08/21/17 1413      Initial Review   Current issues with  None Identified      Family Dynamics   Good Support System?  Yes spouse, cousin, church family    Comments  no psychosocial needs identified, no interventions necessary      Barriers   Psychosocial barriers  to participate in program  There are no identifiable barriers or psychosocial needs.      Screening Interventions   Interventions  Encouraged to exercise;Provide feedback about the scores to participant       Quality of Life Scores: Quality of Life - 08/21/17 1451      Quality of Life Scores   Health/Function Pre  18.47 %    Socioeconomic Pre  23.56 %    Psych/Spiritual Pre  19.79 %    Family Pre  21.8 %    GLOBAL Pre  20.37 %       PHQ-9: Recent Review Flowsheet Data    Depression screen [facility] 2/9 08/31/2017   Decreased Interest 0   Down, Depressed, Hopeless 0   PHQ - 2 Score 0     Interpretation of Total Score  Total Score Depression Severity:  1-4 = Minimal depression, 5-9 = Mild depression, 10-14 = Moderate depression, 15-19 = Moderately severe depression, 20-27 = Severe depression   Psychosocial Evaluation and Intervention:   Psychosocial Re-Evaluation: Psychosocial Re-Evaluation    [place] Name 09/27/17 1626             Psychosocial Re-Evaluation   Current issues with  None Identified       Interventions  Encouraged to attend Cardiac Rehabilitation for the exercise       Continue Psychosocial Services   No Follow up required          Psychosocial Discharge (Final Psychosocial Re-Evaluation): Psychosocial Re-Evaluation - 09/27/17 1626      Psychosocial Re-Evaluation   Current issues with  None Identified    Interventions  Encouraged to attend Cardiac Rehabilitation for the exercise    Continue Psychosocial Services   No Follow up required       Vocational Rehabilitation: Provide vocational rehab assistance to qualifying candidates.   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 Participant will state understanding/return demonstration of topics presented.  Learning Barriers/Preferences: Learning Barriers/Preferences - 08/21/17 1552      Learning Barriers/Preferences   Learning Barriers  Sight    Learning Preferences  Written Material       Education Topics: Count Your Pulse:  -Group instruction provided by verbal instruction, demonstration, patient participation and written materials to support subject.  Instructors address importance of being able to find your pulse and how to count your pulse when at home without a heart monitor.  Patients get hands on experience counting their pulse with staff help and individually.   Heart Attack, Angina, and Risk Factor Modification:  -Group instruction provided by verbal instruction, video, and written materials to support subject.  Instructors address signs and symptoms of angina and heart attacks.    Also discuss risk factors for heart disease and how to make changes to improve heart health risk factors.   Functional Fitness:  -Group instruction provided by verbal instruction, demonstration, patient participation, and written materials to support subject.  Instructors address safety measures for doing things around the house.  Discuss how to get up and down off the floor, how to pick things up properly, how to safely get out of a chair without assistance, and balance training.   Meditation and Mindfulness:  -Group instruction provided by verbal instruction, patient participation, and written materials to support subject.  Instructor addresses importance of mindfulness and meditation practice to help reduce stress and improve awareness.  Instructor also leads participants through a meditation exercise.    Stretching for Flexibility and Mobility:  -Group instruction provided by verbal instruction, patient participation, and written materials to support subject.  Instructors lead participants through series of  stretches that are designed to increase flexibility thus improving mobility.  These stretches are additional exercise for major muscle groups that are typically performed during regular warm up and cool down.   Hands Only CPR:  -Group verbal, video, and participation provides a basic overview of AHA guidelines for community CPR. Role-play of emergencies allow participants the opportunity to practice calling for help and chest compression technique with discussion of AED use.   Hypertension: -Group verbal and written instruction that provides a basic overview of hypertension including the most recent diagnostic guidelines, risk factor reduction with self-care instructions and medication management.    Nutrition I class: Heart Healthy Eating:  -Group instruction provided by PowerPoint slides, verbal discussion, and written materials to support subject matter. The instructor gives an explanation and review of the Therapeutic Lifestyle Changes diet recommendations, which includes a discussion on lipid goals, dietary fat, sodium, fiber, plant stanol/sterol esters, sugar, and the components of a well-balanced, healthy diet.   CARDIAC REHAB PHASE II ORIENTATION from 08/21/2017 in [place]  Date  08/21/17 [place] handouts given]  Educator  RD  Instruction Review Code  Not applicable      Nutrition II class: Lifestyle Skills:  -Group instruction provided by PowerPoint slides, verbal discussion, and written materials to support subject matter. The instructor gives an explanation and review of label reading, grocery shopping for heart health, heart healthy recipe modifications, and ways to make healthier choices when eating out.   CARDIAC REHAB PHASE II ORIENTATION from 08/21/2017 in [place]  Date  08/21/17 [place] handouts given]  Educator  RD  Instruction Review Code  Not applicable      Diabetes Question & Answer:  -Group  instruction provided by PowerPoint slides, verbal discussion, and written materials to support subject matter. The instructor gives an explanation and review of diabetes co-morbidities, pre- and post-prandial blood glucose goals, pre-exercise blood glucose goals, signs, symptoms, and treatment of hypoglycemia and hyperglycemia, and foot care basics.   Diabetes Blitz:  -Group instruction provided by PowerPoint slides, verbal discussion, and written materials to support subject matter. The instructor gives an explanation and review of the physiology behind type 1 and type 2 diabetes, diabetes medications and rational behind using different medications, pre- and post-prandial blood glucose recommendations and Hemoglobin A1c goals, diabetes diet, and exercise including blood glucose guidelines for exercising safely.    Portion Distortion:  -Group instruction provided by PowerPoint slides, verbal discussion, written materials, and food models to support subject matter. The instructor gives an explanation of serving size versus portion size, changes in portions sizes over the last 20 years, and what consists of a serving from each food group.   Stress Management:  -Group instruction provided by verbal instruction, video, and written materials to support subject matter.  Instructors review role of stress in heart disease and how to cope with stress positively.     Exercising on Your Own:  -Group instruction provided by verbal instruction, power point, and written materials to support subject.  Instructors discuss benefits of exercise, components of exercise, frequency and intensity of exercise, and end points for exercise.  Also discuss use of nitroglycerin and activating EMS.  Review options of places to exercise outside of rehab.  Review guidelines for sex with heart disease.   Cardiac Drugs I:  -Group instruction provided by verbal instruction and written materials to support subject.  Instructor  reviews cardiac drug classes: antiplatelets, anticoagulants, beta blockers, and statins.  Instructor discusses reasons, side effects, and lifestyle considerations for each drug class.   Cardiac Drugs II:  -Group instruction provided by verbal instruction and written materials to support subject.  Instructor reviews cardiac drug classes: angiotensin converting enzyme inhibitors (ACE-I), angiotensin II receptor blockers (ARBs), nitrates, and calcium channel blockers.  Instructor discusses reasons, side effects, and lifestyle considerations for each drug class.   Anatomy and Physiology of the Circulatory System:  Group verbal and written instruction and models provide basic cardiac anatomy and physiology, with the coronary electrical and arterial systems. Review of: AMI, Angina, Valve disease, Heart Failure, Peripheral Artery Disease, Cardiac Arrhythmia, Pacemakers, and the ICD.   Other Education:  -Group or individual verbal, written, or video instructions that support the educational goals of the cardiac rehab program.   Knowledge Questionnaire Score: Knowledge Questionnaire Score - 08/21/17 1547      Knowledge Questionnaire Score   Pre Score  22/24       Core Components/Risk Factors/Patient Goals at Admission: Personal Goals and Risk Factors at Admission - 08/21/17 1609      Core Components/Risk Factors/Patient Goals on Admission    Weight Management  Yes;Weight Loss    Intervention  Weight Management: Provide education and appropriate resources to help participant work on and attain dietary goals.;Weight Management: Develop a combined nutrition and exercise program designed to reach desired caloric intake, while maintaining appropriate intake of nutrient and fiber, sodium and fats, and appropriate energy expenditure required for the weight goal.    Admit Weight  201 lb 15.1 oz (91.6 kg)    Goal Weight: Short Term  195 lb (88.5 kg)    Goal Weight: Long Term  190 lb (86.2 kg)     Expected Outcomes  Short  Term: Continue to assess and modify interventions until short term weight is achieved;Weight Loss: Understanding of general recommendations for a balanced deficit meal plan, which promotes 1-2 lb weight loss per week and includes a negative energy balance of [id number] kcal/d;Understanding recommendations for meals to include 15-35% energy as protein, 25-35% energy from fat, 35-60% energy from carbohydrates, less than 200mg  of dietary cholesterol, 20-35 gm of total fiber daily;Understanding of distribution of calorie intake throughout the day with the consumption of 4-5 meals/snacks;Long Term: Adherence to nutrition and physical activity/exercise program aimed toward attainment of established weight goal    Diabetes  Yes    Intervention  Provide education about proper nutrition, including hydration, and aerobic/resistive exercise prescription along with prescribed medications to achieve blood glucose in normal ranges: Fasting glucose 65-99 mg/dL;Provide education about signs/symptoms and action to take for hypo/hyperglycemia.    Expected Outcomes  Short Term: Participant verbalizes understanding of the signs/symptoms and immediate care of hyper/hypoglycemia, proper foot care and importance of medication, aerobic/resistive exercise and nutrition plan for blood glucose control.;Long Term: Attainment of HbA1C < 7%.    Lipids  Yes    Intervention  Provide education and support for participant on nutrition & aerobic/resistive exercise along with prescribed medications to achieve LDL 70mg , HDL >40mg .    Expected Outcomes  Short Term: Participant states understanding of desired cholesterol values and is compliant with medications prescribed. Participant is following exercise prescription and nutrition guidelines.;Long Term: Cholesterol controlled with medications as prescribed, with individualized exercise RX and with personalized nutrition plan. Value goals: LDL < 70mg , HDL > 40 mg.        Core Components/Risk Factors/Patient Goals Review:  Goals and Risk Factor Review    Row Name 09/27/17 1622             Core Components/Risk Factors/Patient Goals Review   Personal Goals Review  Weight Management/Obesity;Lipids;Diabetes       Review  Tashon vital signs and CBG's have been stable at cardiac rehab.       Expected Outcomes  Davidlee will continue to participate in phase 2 cardiac rehab and take his medicaitons as presribed.          Core Components/Risk Factors/Patient Goals at Discharge (Final Review):  Goals and Risk Factor Review - 09/27/17 1622      Core Components/Risk Factors/Patient Goals Review   Personal Goals Review  Weight Management/Obesity;Lipids;Diabetes    Review  Sevon vital signs and CBG's have been stable at cardiac rehab.    Expected Outcomes  Derelle will continue to participate in phase 2 cardiac rehab and take his medicaitons as presribed.       ITP Comments: ITP Comments    Row Name 08/21/17 1342 08/21/17 1549 08/31/17 1314 09/27/17 1620     ITP Comments  Dr. Fransico Him, Medical Director  Dr. Fransico Him Medical Director   30 DAY ITP Review. Patient started exercise today  30 DAY ITP Review. Patient with good attendance and participation at cardiac rehab.       Comments: See ITP comment.Barnet Pall, RN,BSN 09/27/2017 4:28 PM

## 2017-09-28 ENCOUNTER — Encounter (HOSPITAL_COMMUNITY)
Admission: RE | Admit: 2017-09-28 | Discharge: 2017-09-28 | Disposition: A | Discharge status: Home / Self Care | Payer: Medicare HMO | Source: Ambulatory Visit | Attending: Cardiology | Admitting: Cardiology

## 2017-09-28 DIAGNOSIS — Z955 Presence of coronary angioplasty implant and graft: Secondary | ICD-10-CM | POA: Diagnosis not present

## 2017-10-01 ENCOUNTER — Encounter (HOSPITAL_COMMUNITY)
Admission: RE | Admit: 2017-10-01 | Discharge: 2017-10-01 | Disposition: A | Discharge status: Home / Self Care | Payer: Medicare HMO | Source: Ambulatory Visit

## 2017-10-01 DIAGNOSIS — Z955 Presence of coronary angioplasty implant and graft: Secondary | ICD-10-CM | POA: Diagnosis not present

## 2017-10-05 ENCOUNTER — Encounter (HOSPITAL_COMMUNITY)
Admission: RE | Admit: 2017-10-05 | Discharge: 2017-10-05 | Disposition: A | Discharge status: Home / Self Care | Payer: Medicare HMO | Source: Ambulatory Visit | Attending: Cardiology | Admitting: Cardiology

## 2017-10-05 ENCOUNTER — Encounter (HOSPITAL_COMMUNITY): Payer: Self-pay

## 2017-10-05 DIAGNOSIS — Z7982 Long term (current) use of aspirin: Secondary | ICD-10-CM | POA: Diagnosis not present

## 2017-10-05 DIAGNOSIS — Z79899 Other long term (current) drug therapy: Secondary | ICD-10-CM | POA: Insufficient documentation

## 2017-10-05 DIAGNOSIS — I10 Essential (primary) hypertension: Secondary | ICD-10-CM | POA: Diagnosis not present

## 2017-10-05 DIAGNOSIS — Z955 Presence of coronary angioplasty implant and graft: Secondary | ICD-10-CM | POA: Diagnosis present

## 2017-10-05 DIAGNOSIS — Z7984 Long term (current) use of oral hypoglycemic drugs: Secondary | ICD-10-CM | POA: Diagnosis not present

## 2017-10-05 DIAGNOSIS — Z87891 Personal history of nicotine dependence: Secondary | ICD-10-CM | POA: Diagnosis not present

## 2017-10-05 DIAGNOSIS — I252 Old myocardial infarction: Secondary | ICD-10-CM | POA: Insufficient documentation

## 2017-10-05 DIAGNOSIS — E119 Type 2 diabetes mellitus without complications: Secondary | ICD-10-CM | POA: Diagnosis not present

## 2017-10-05 NOTE — Progress Notes (Signed)
Nicholas Maxwell 72 y.o. male DOB: 1946-05-31 MRN: [id number]      Nutrition Note  1. 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 Meftormin XR noted  Note Spoke with pt. Nutrition Plan and Nutrition Survey goals reviewed with pt. Pt is working toward following a [employer]. Pt is diabetic. No recent A1c noted. Last A1c was 6.1 per pt. Pt checks his CBG's daily. Per discussion, pt is eating more than normal before exercise because CBG's drop significantly after exercising. Pt states, "I'm one of those people who's liver produces blood sugar when they sleep." Diabetes, liver function in diabetes, and how Metformin works in DM. This Probation officer went over Diabetes Education test results. Pt expressed understanding of the information reviewed. Pt aware of nutrition education classes offered.  Nutrition Diagnosis ? Food-and nutrition-related knowledge deficit related to lack of exposure to information as related to diagnosis of: ? CVD ? DM ? Overweight related to excessive energy intake as evidenced by a BMI of 27.3  Nutrition Intervention ? Pt's individual nutrition plan reviewed with pt. ? Benefits of adopting Heart Healthy diet discussed when [employer] reviewed. ? Pt to try taking half of Metformin at night. ? Pt to report results of Metformin trial to staff and to PCP at his appointment early next month.     Nutrition Goal(s):  ? Pt to identify and limit food sources of saturated fat, trans fat, and sodium ? Pt to identify food quantities necessary to achieve weight loss of 6-24 lb at graduation from cardiac rehab. Goal wt of 185 lb desired.  ? CBG concentrations in the normal range or as close to normal as is safely possible.  Plan:  Pt to attend nutrition classes ? Portion Distortion ? Diabetes Blitz ? Diabetes Q & A Will provide client-centered nutrition education as part of interdisciplinary care.   Monitor and evaluate progress toward nutrition goal with  team.  Derek Mound, M.Ed, RD, LDN, CDE 10/05/2017 11:59 AM

## 2017-10-08 ENCOUNTER — Encounter (HOSPITAL_COMMUNITY)
Admission: RE | Admit: 2017-10-08 | Discharge: 2017-10-08 | Disposition: A | Discharge status: Home / Self Care | Payer: Medicare HMO | Source: Ambulatory Visit | Attending: Cardiology | Admitting: Cardiology

## 2017-10-08 DIAGNOSIS — Z955 Presence of coronary angioplasty implant and graft: Secondary | ICD-10-CM | POA: Diagnosis not present

## 2017-10-12 ENCOUNTER — Encounter (HOSPITAL_COMMUNITY)
Admission: RE | Admit: 2017-10-12 | Discharge: 2017-10-12 | Disposition: A | Discharge status: Home / Self Care | Payer: Medicare HMO | Source: Ambulatory Visit | Attending: Cardiology | Admitting: Cardiology

## 2017-10-12 DIAGNOSIS — Z955 Presence of coronary angioplasty implant and graft: Secondary | ICD-10-CM

## 2017-10-15 ENCOUNTER — Encounter (HOSPITAL_COMMUNITY)
Admission: RE | Admit: 2017-10-15 | Discharge: 2017-10-15 | Disposition: A | Discharge status: Home / Self Care | Payer: Medicare HMO | Source: Ambulatory Visit | Attending: Cardiology | Admitting: Cardiology

## 2017-10-15 DIAGNOSIS — Z955 Presence of coronary angioplasty implant and graft: Secondary | ICD-10-CM

## 2017-10-19 ENCOUNTER — Encounter (HOSPITAL_COMMUNITY)
Admission: RE | Admit: 2017-10-19 | Discharge: 2017-10-19 | Disposition: A | Discharge status: Home / Self Care | Payer: Medicare HMO | Source: Ambulatory Visit

## 2017-10-19 DIAGNOSIS — Z955 Presence of coronary angioplasty implant and graft: Secondary | ICD-10-CM | POA: Diagnosis not present

## 2017-10-22 ENCOUNTER — Encounter (HOSPITAL_COMMUNITY)
Admission: RE | Admit: 2017-10-22 | Discharge: 2017-10-22 | Disposition: A | Discharge status: Home / Self Care | Payer: Medicare HMO | Source: Ambulatory Visit | Attending: Cardiology | Admitting: Cardiology

## 2017-10-22 DIAGNOSIS — Z955 Presence of coronary angioplasty implant and graft: Secondary | ICD-10-CM

## 2017-10-23 NOTE — Progress Notes (Signed)
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  alfuzosin (UROXATRAL) 10 MG 24 hr tablet, Take 10 mg by mouth daily with supper., Disp: , Rfl:  .  allopurinol (ZYLOPRIM) 300 MG tablet, Take 300 mg by mouth daily with lunch., Disp: , Rfl:  .  aspirin EC 81 MG tablet, Take 81 mg by mouth daily with breakfast., Disp: , Rfl:  .  clopidogrel (PLAVIX) 75 MG tablet, Take 1 tablet (75 mg total) by mouth daily with breakfast., Disp: 90 tablet, Rfl: 2 .  isosorbide mononitrate (IMDUR) 30 MG 24 hr tablet, Take 30 mg by mouth daily., Disp: , Rfl:  .  metFORMIN (GLUCOPHAGE-XR) 500 MG 24 hr tablet, Take 1,000 mg by mouth daily with supper., Disp: , Rfl:  .  metoprolol succinate (TOPROL-XL) 25 MG 24 hr tablet, Take 25 mg by mouth daily with breakfast., Disp: , Rfl:  .  nitroGLYCERIN (NITROSTAT) 0.4 MG SL tablet, Place 0.4 mg under the tongue every 5 (five) minutes x 3 doses as needed. For chest pain., Disp: , Rfl:  .  ramipril (ALTACE) 2.5 MG capsule, Take 5 mg daily with breakfast by mouth. , Disp: , Rfl:  .  REPATHA SURECLICK 353 MG/ML SOAJ, Inject 140 mg into the skin every 14 (fourteen) days., Disp: , Rfl:   Past Medical History: Past Medical History:  Diagnosis Date  . Abnormal stress test 05/18/2017  . Arthritis    "right knee" (05/24/2017)  . Chest pain   . Coronary artery disease   . Gout   . High cholesterol   .  History of kidney stones   . Hypertension   . Myocardial infarction ([place]) 04/2017  . Pneumonia 1990s X 1  . Statin intolerance    Used >2 different statins  . Type II diabetes mellitus ([facility])     Tobacco Use: Social History   Tobacco Use  Smoking Status Former Smoker  . Packs/day: 1.00  . Years: 30.00  . Pack years: 30.00  . Types: Cigarettes  . Last attempt to quit: 2003  . Years since quitting: 16.0  Smokeless Tobacco Never Used    Labs: Recent Review Flowsheet Data    There is no flowsheet data to display.      Capillary Blood Glucose: Lab Results  Component Value Date   GLUCAP 119 (H) 09/07/2017   GLUCAP 206 (H) 09/07/2017   GLUCAP 129 (H) 09/03/2017   GLUCAP 120 (H) 09/03/2017   GLUCAP 104 (H) 08/31/2017     Exercise Target Goals:    Exercise Program Goal: Individual exercise prescription set using results from initial 6 min walk test and THRR while considering  patient's activity barriers and safety.   Exercise Prescription Goal: Initial exercise prescription builds to 30-45 minutes a day of aerobic activity, 2-3 days per week.  Home exercise guidelines will be given to patient during program as part of exercise prescription that the  participant will acknowledge.  Activity Barriers & Risk Stratification: Activity Barriers & Cardiac Risk Stratification - 08/21/17 1554      Activity Barriers & Cardiac Risk Stratification   Activity Barriers  Other (comment)    Comments  R knee pain    Cardiac Risk Stratification  High       6 Minute Walk: 6 Minute Walk    Row Name 08/21/17 1537 08/21/17 1540 08/21/17 1547     6 Minute Walk   Phase  Initial  -  Initial   Distance  1671 feet  -  1671 feet   Walk Time  6 minutes  -  6 minutes   # of Rest Breaks  0  -  0   MPH  3.16  -  3.16   METS  3.45  -  3.45   RPE  12  -  12   Perceived Dyspnea   0  -  0   VO2 Peak  12.09  -  12.09   Symptoms  -  No  -   Resting HR  74 bpm  -  74 bpm   Resting BP  104/62   -  104/62   Resting Oxygen Saturation   98 %  -  98 %   Exercise Oxygen Saturation  during 6 min walk  96 %  -  96 %   Max Ex. HR  99 bpm  -  99 bpm   Max Ex. BP  122/62  -  122/62   2 Minute Post BP  128/62  -  128/82      Oxygen Initial Assessment:   Oxygen Re-Evaluation:   Oxygen Discharge (Final Oxygen Re-Evaluation):   Initial Exercise Prescription: Initial Exercise Prescription - 08/21/17 1500      Date of Initial Exercise RX and Referring Provider   Date  08/21/17    Referring Provider  Shirlee More MD      Treadmill   MPH  2.7    Grade  0    Minutes  10    METs  3.07      Bike   Level  0.8    Minutes  10    METs  2.68      NuStep   Level  3    SPM  80    Minutes  10    METs  2.5      Prescription Details   Frequency (times per week)  3    Duration  Progress to 30 minutes of continuous aerobic without signs/symptoms of physical distress      Intensity   THRR 40-80% of Max Heartrate  60-120    Ratings of Perceived Exertion  11-13    Perceived Dyspnea  0-4      Progression   Progression  Continue progressive overload as per policy without signs/symptoms or physical distress.      Resistance Training   Training Prescription  Yes    Weight  3lbs    Reps  10-15       Perform Capillary Blood Glucose checks as needed.  Exercise Prescription Changes:  Exercise Prescription Changes    Row Name 08/31/17 1300 09/07/17 1422 09/17/17 1420 10/08/17 0905 10/22/17 0906     Response to Exercise   Blood Pressure (Admit)  110/62  120/70  124/70  104/54  104/60   Blood Pressure (Exercise)  128/76  132/78  134/74  134/70  148/82   Blood Pressure (Exit)  120/62  112/70  112/74  114/62  113/64   Heart Rate (Admit)  71 bpm  72 bpm  80 bpm  78 bpm  67 bpm   Heart Rate (Exercise)  92 bpm  99 bpm  117 bpm  122 bpm  109 bpm   Heart Rate (Exit)  91 bpm  72 bpm  80 bpm  78 bpm  77 bpm   Rating of Perceived Exertion (Exercise)  11  11  12  12  13    Symptoms  -   none  none  None  None   Duration  Continue with 30 min of aerobic exercise without signs/symptoms of physical distress.  Continue with 30 min of aerobic exercise without signs/symptoms of physical distress.  Continue with 30 min of aerobic exercise without signs/symptoms of physical distress.  Continue with 30 min of aerobic exercise without signs/symptoms of physical distress.  Continue with 30 min of aerobic exercise without signs/symptoms of physical distress.   Intensity  THRR unchanged  THRR unchanged  THRR unchanged  THRR unchanged  THRR unchanged     Progression   Progression  Continue to progress workloads to maintain intensity without signs/symptoms of physical distress.  Continue to progress workloads to maintain intensity without signs/symptoms of physical distress.  Continue to progress workloads to maintain intensity without signs/symptoms of physical distress.  Continue to progress workloads to maintain intensity without signs/symptoms of physical distress.  Continue to progress workloads to maintain intensity without signs/symptoms of physical distress.   Average METs  2.7  2.9  3.3  4.4  4.4     Resistance Training   Training Prescription  Yes  Yes  Yes  Yes  Yes   Weight  3lbs  4lbs  4lbs  4lbs  4lbs   Reps  10-15  10-15  10-15  10-15  10-15   Time  -  10 Minutes  10 Minutes  10 Minutes  10 Minutes     Treadmill   MPH  2.7  2.7  2.7  3  3    Grade  0  0  0  2  2   Minutes  10  10  10  10  10    METs  3.07  3.07  3.07  4.12  4.12     Bike   Level  0.8  0.8  1.3  1.6  1.8   Minutes  10  10  10  10  10    METs  2.68  2.68  3.67  4.27  4.71     NuStep   Level  4  4  5  6  6    SPM  80  80  85  85  85   Minutes  10  10  10  10  10    METs  2.6  3.2  3.2  4.8  4.5     Home Exercise Plan   Plans to continue exercise at  -  [employer] (comment) [place] (comment) [place] (comment) [place] (comment) [employer]   Frequency  -   Add 2 additional days to program exercise sessions.  Add 2 additional days to program exercise sessions.  Add 2 additional days to program exercise sessions.  Add 2 additional days to program exercise sessions.   Initial Home Exercises Provided  -  09/07/17  09/07/17  09/07/17  09/07/17      Exercise Comments:  Exercise Comments  [place] Name 08/31/17 1344 09/07/17 1457 09/18/17 1429 10/24/17 0908     Exercise Comments  Today was the patient first day. Patient was oriented to exercise equipment. Understands RPE scale. Will cont. to monitor and progress as able.   Reviewed Home Exercise Plan with patient today. Pt plans to walk and continue deep water aerobics at local [employer]. Will continue to monitor and progress pt.   Reviewed METs and goals. Pt is making great progress in cardiac rehab. Pt's activity will be progressed based on signs/symptoms of physical distress and exercise/activity tolerance.   Reviewed METs and goals with patient. Pt is responding very well to workload increases. Pt is making great program and is not able to walk with 2% incline on treadmill.        Exercise Goals and Review:  Exercise Goals    Row Name 08/21/17 1351             Exercise Goals   Increase Physical Activity  Yes       Intervention  Provide advice, education, support and counseling about physical activity/exercise needs.;Develop an individualized exercise prescription for aerobic and resistive training based on initial evaluation findings, risk stratification, comorbidities and participant's personal goals.       Expected Outcomes  Achievement of increased cardiorespiratory fitness and enhanced flexibility, muscular endurance and strength shown through measurements of functional capacity and personal statement of participant.       Increase Strength and Stamina  Yes       Intervention  Provide advice, education, support and counseling about physical activity/exercise needs.;Develop an individualized  exercise prescription for aerobic and resistive training based on initial evaluation findings, risk stratification, comorbidities and participant's personal goals.       Expected Outcomes  Achievement of increased cardiorespiratory fitness and enhanced flexibility, muscular endurance and strength shown through measurements of functional capacity and personal statement of participant.       Able to understand and use rate of perceived exertion (RPE) scale  Yes       Intervention  Provide education and explanation on how to use RPE scale       Expected Outcomes  Short Term: Able to use RPE daily in rehab to express subjective intensity level;Long Term:  Able to use RPE to guide intensity level when exercising independently       Knowledge and understanding of Target Heart Rate Range (THRR)  Yes       Intervention  Provide education and explanation of THRR including how the numbers were predicted and where they are located for reference       Expected Outcomes  Short Term: Able to state/look up THRR;Long Term: Able to use THRR to govern intensity when exercising independently;Short Term: Able to use daily as guideline for intensity in rehab       Able to check pulse independently  Yes       Intervention  Provide education and demonstration on how to check pulse in carotid and radial arteries.;Review the importance of being able to check your own pulse for safety during independent exercise       Expected Outcomes  Short Term: Able to explain why pulse checking is important during independent exercise;Long Term: Able to check pulse independently and accurately       Understanding of Exercise Prescription  Yes       Intervention  Provide education, explanation, and written materials on patient's individual exercise prescription       Expected Outcomes  Short Term: Able to explain program exercise prescription;Long Term: Able to explain home exercise prescription to exercise independently           Exercise Goals Re-Evaluation : Exercise Goals Re-Evaluation    Row Name 08/31/17 1345 09/18/17 1428 10/24/17 0909         Exercise Goal Re-Evaluation   Exercise Goals Review  Able to understand and use rate of perceived exertion (RPE) scale;Increase Physical Activity  Increase Physical Activity;Able to understand and use rate of perceived exertion (RPE) scale;Knowledge and understanding of Target Heart Rate Range (THRR);Understanding of Exercise Prescription;Increase Strength and Stamina;Able to check pulse independently  Increase Physical Activity;Able to understand and use rate of perceived exertion (RPE) scale;Knowledge and understanding of Target Heart Rate Range (THRR);Understanding of Exercise Prescription;Increase Strength and Stamina;Able to check pulse independently     Comments  Patient was originally prescribed Level 3 on the Stepper--was able to work at a level of 4. States that he understands RPE scale.  Pt is making great progress in cardiac rehab. Pt tolerates WL increases very well and feels as though he is getting stronger.  Pt is tolerating the workload increases well and is continuing to improve his cardiorespiratory fitness. Pt states he is feeling stronger as well.      Expected Outcomes  Through exercise at rehab and at home, patient will increase strength, stamina, and physical capacity.   Through exercise at rehab and at home, patient will increase strength, stamina, and physical capacity.   Pt is making great progress. Pt is exercising at home and in rehab and is very motivated to continue progressing.          Discharge Exercise Prescription (Final Exercise Prescription Changes): Exercise Prescription Changes - 10/22/17 0906      Response to Exercise   Blood Pressure (Admit)  104/60    Blood Pressure (Exercise)  148/82    Blood Pressure (Exit)  113/64    Heart Rate (Admit)  67 bpm    Heart Rate (Exercise)  109 bpm    Heart Rate (Exit)  77 bpm    Rating of  Perceived Exertion (Exercise)  13    Symptoms  None    Duration  Continue with 30 min of aerobic exercise without signs/symptoms of physical distress.    Intensity  THRR unchanged      Progression   Progression  Continue to progress workloads to maintain intensity without signs/symptoms of physical distress.    Average METs  4.4      Resistance Training   Training Prescription  Yes    Weight  4lbs    Reps  10-15    Time  10 Minutes      Treadmill   MPH  3    Grade  2    Minutes  10    METs  4.12      Bike   Level  1.8    Minutes  10    METs  4.71      NuStep   Level  6    SPM  85    Minutes  10    METs  4.5      Home Exercise Plan   Plans to continue exercise at  [employer] (comment) [employer]    Frequency  Add 2 additional days to program exercise sessions.    Initial Home Exercises Provided  09/07/17       Nutrition:  Target Goals: Understanding of nutrition guidelines, daily intake of  sodium 1500mg , cholesterol 200mg , calories 30% from fat and 7% or less from saturated fats, daily to have 5 or more servings of fruits and vegetables.  Biometrics: Pre Biometrics - 08/21/17 1631      Pre Biometrics   Height  5' 9.75" (1.772 m)    Weight  201 lb 15.1 oz (91.6 kg)    Waist Circumference  41 inches    Hip Circumference  41.75 inches    Waist to Hip Ratio  0.98 %    BMI (Calculated)  29.17    Triceps Skinfold  17 mm    % Body Fat  29 %    Grip Strength  39 kg    Flexibility  13 in    Single Leg Stand  24.03 seconds        Nutrition Therapy Plan and Nutrition Goals: Nutrition Therapy & Goals - 08/21/17 1509      Nutrition Therapy   Diet  Carb Modified, Heart Healthy      Personal Nutrition Goals   Nutrition Goal  Pt to identify food quantities necessary to achieve weight loss of 6-24 lb at graduation from cardiac rehab. Goal wt of 185 lb desired.     Personal Goal #2  Pt to identify and limit food sources of saturated fat, trans fat, and sodium     Personal Goal #3  CBG concentrations in the normal range or as close to normal as is safely possible.      Intervention Plan   Intervention  Prescribe, educate and counsel regarding individualized specific dietary modifications aiming towards targeted core components such as weight, hypertension, lipid management, diabetes, heart failure and other comorbidities.    Expected Outcomes  Short Term Goal: Understand basic principles of dietary content, such as calories, fat, sodium, cholesterol and nutrients.;Long Term Goal: Adherence to prescribed nutrition plan.       Nutrition Assessments: Nutrition Assessments - 08/21/17 1509      MEDFICTS Scores   Pre Score  83       Nutrition Goals Re-Evaluation:   Nutrition Goals Re-Evaluation:   Nutrition Goals Discharge (Final Nutrition Goals Re-Evaluation):   Psychosocial: Target Goals: Acknowledge presence or absence of significant depression and/or stress, maximize coping skills, provide positive support system. Participant is able to verbalize types and ability to use techniques and skills needed for reducing stress and depression.  Initial Review & Psychosocial Screening: Initial Psych Review & Screening - 08/21/17 1413      Initial Review   Current issues with  None Identified      Family Dynamics   Good Support System?  Yes spouse, cousin, church family    Comments  no psychosocial needs identified, no interventions necessary      Barriers   Psychosocial barriers to participate in program  There are no identifiable barriers or psychosocial needs.      Screening Interventions   Interventions  Encouraged to exercise;Provide feedback about the scores to participant       Quality of Life Scores: Quality of Life - 08/21/17 1451      Quality of Life Scores   Health/Function Pre  18.47 %    Socioeconomic Pre  23.56 %    Psych/Spiritual Pre  19.79 %    Family Pre  21.8 %    GLOBAL Pre  20.37 %      Scores of 19 and below  usually indicate a poorer quality of life in these areas.  A difference of  2-3 points  is a clinically meaningful difference.  A difference of 2-3 points in the total score of the Quality of Life Index has been associated with significant improvement in overall quality of life, self-image, physical symptoms, and general health in studies assessing change in quality of life.  PHQ-9: Recent Review Flowsheet Data    Depression screen [facility] 2/9 08/31/2017   Decreased Interest 0   Down, Depressed, Hopeless 0   PHQ - 2 Score 0     Interpretation of Total Score  Total Score Depression Severity:  1-4 = Minimal depression, 5-9 = Mild depression, 10-14 = Moderate depression, 15-19 = Moderately severe depression, 20-27 = Severe depression   Psychosocial Evaluation and Intervention:   Psychosocial Re-Evaluation: Psychosocial Re-Evaluation    [place] Name 09/27/17 1626 10/23/17 1646           Psychosocial Re-Evaluation   Current issues with  None Identified  None Identified      Interventions  Encouraged to attend Cardiac Rehabilitation for the exercise  Encouraged to attend Cardiac Rehabilitation for the exercise      Continue Psychosocial Services   No Follow up required  No Follow up required         Psychosocial Discharge (Final Psychosocial Re-Evaluation): Psychosocial Re-Evaluation - 10/23/17 1646      Psychosocial Re-Evaluation   Current issues with  None Identified    Interventions  Encouraged to attend Cardiac Rehabilitation for the exercise    Continue Psychosocial Services   No Follow up required       Vocational Rehabilitation: Provide vocational rehab assistance to qualifying candidates.   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 Participant will state understanding/return demonstration of topics presented.  Learning Barriers/Preferences: Learning Barriers/Preferences - 08/21/17 1552      Learning Barriers/Preferences   Learning Barriers  Sight    Learning Preferences  Written Material       Education Topics: Count Your Pulse:  -Group instruction provided by verbal instruction, demonstration, patient participation and written materials to support subject.  Instructors address importance of being able to find your pulse and how to count your pulse when at home without a heart monitor.  Patients get hands on experience counting their pulse with staff help and individually.   Heart Attack, Angina, and Risk Factor Modification:  -Group instruction provided by verbal instruction, video, and written materials to support subject.  Instructors address signs and symptoms of angina and heart attacks.    Also discuss risk factors for heart disease and how to make changes to improve heart health risk factors.   Functional Fitness:  -Group instruction provided by verbal instruction, demonstration, patient participation, and written materials to support subject.  Instructors address safety measures for doing things around the house.  Discuss how to get up and down off the floor, how to pick things up properly, how to safely get out of a chair without assistance, and balance training.   CARDIAC REHAB PHASE II EXERCISE from 10/19/2017 in [place]  Date  09/28/17  Instruction Review Code  2- meets goals/outcomes      Meditation and Mindfulness:  -Group instruction provided by verbal instruction, patient participation, and written materials to support subject.  Instructor addresses importance of mindfulness and meditation practice to help reduce stress and improve awareness.  Instructor also leads participants through a meditation exercise.    Stretching  for Flexibility and Mobility:  -Group instruction provided by verbal instruction, patient participation, and written materials to support subject.  Instructors lead participants through series of stretches that are designed to increase flexibility thus improving mobility.  These stretches are additional exercise for major muscle groups that are typically performed during regular warm up and cool down.   CARDIAC REHAB PHASE II EXERCISE from 10/19/2017 in [place]  Date  10/05/17  Instruction Review Code  2- meets goals/outcomes      Hands Only CPR:  -Group verbal, video, and participation provides a basic overview of AHA guidelines for community CPR. Role-play of emergencies allow participants the opportunity to practice calling for help and chest compression technique with discussion of AED use.   Hypertension: -Group verbal and written instruction that provides a basic overview of hypertension including the most recent diagnostic guidelines, risk factor reduction with self-care instructions and medication management.    Nutrition I class: Heart Healthy Eating:  -Group instruction provided by PowerPoint slides, verbal discussion, and written materials to support subject matter. The instructor gives an explanation and review of the Therapeutic Lifestyle Changes diet recommendations, which includes a discussion on lipid goals, dietary fat, sodium, fiber, plant stanol/sterol esters, sugar, and the components of a well-balanced, healthy diet.   CARDIAC REHAB PHASE II EXERCISE from 10/19/2017 in [place]  Date  08/21/17 [place] handouts given]  Educator  RD  Instruction Review Code  Not applicable      Nutrition II class: Lifestyle Skills:  -Group instruction provided by PowerPoint slides, verbal discussion, and written materials to support subject matter. The instructor gives an explanation and review of label reading, grocery  shopping for heart health, heart healthy recipe modifications, and ways to make healthier choices when eating out.   CARDIAC REHAB PHASE II EXERCISE from 10/19/2017 in [place]  Date  08/21/17 [place] handouts given]  Educator  RD  Instruction Review Code  Not applicable      Diabetes Question & Answer:  -Group instruction provided by PowerPoint slides, verbal discussion, and written materials to support subject matter. The instructor gives an explanation and review of diabetes co-morbidities, pre- and post-prandial blood glucose goals, pre-exercise blood glucose goals, signs, symptoms, and treatment of hypoglycemia and hyperglycemia, and foot care basics.   CARDIAC REHAB PHASE II EXERCISE from 10/19/2017 in [place]  Date  10/19/17  Educator  RD  Instruction Review Code  2- meets goals/outcomes      Diabetes Blitz:  -Group instruction provided by PowerPoint slides, verbal discussion, and written materials to support subject matter. The instructor gives an explanation and review of the physiology behind type 1 and type 2 diabetes, diabetes medications and rational behind using different medications, pre- and post-prandial blood glucose recommendations and Hemoglobin A1c goals, diabetes diet, and exercise including blood glucose guidelines for exercising safely.    Portion Distortion:  -Group instruction provided by PowerPoint slides, verbal discussion, written materials, and food models to support subject matter. The instructor gives an explanation of serving size versus portion size, changes in portions sizes over the last 20 years, and what consists of a serving from each food group.   Stress Management:  -Group instruction provided by verbal instruction, video, and written materials  to support subject matter.  Instructors review role of stress in heart disease and how to cope with stress positively.     Exercising on  Your Own:  -Group instruction provided by verbal instruction, power point, and written materials to support subject.  Instructors discuss benefits of exercise, components of exercise, frequency and intensity of exercise, and end points for exercise.  Also discuss use of nitroglycerin and activating EMS.  Review options of places to exercise outside of rehab.  Review guidelines for sex with heart disease.   Cardiac Drugs I:  -Group instruction provided by verbal instruction and written materials to support subject.  Instructor reviews cardiac drug classes: antiplatelets, anticoagulants, beta blockers, and statins.  Instructor discusses reasons, side effects, and lifestyle considerations for each drug class.   Cardiac Drugs II:  -Group instruction provided by verbal instruction and written materials to support subject.  Instructor reviews cardiac drug classes: angiotensin converting enzyme inhibitors (ACE-I), angiotensin II receptor blockers (ARBs), nitrates, and calcium channel blockers.  Instructor discusses reasons, side effects, and lifestyle considerations for each drug class.   Anatomy and Physiology of the Circulatory System:  Group verbal and written instruction and models provide basic cardiac anatomy and physiology, with the coronary electrical and arterial systems. Review of: AMI, Angina, Valve disease, Heart Failure, Peripheral Artery Disease, Cardiac Arrhythmia, Pacemakers, and the ICD.   Other Education:  -Group or individual verbal, written, or video instructions that support the educational goals of the cardiac rehab program.   Knowledge Questionnaire Score: Knowledge Questionnaire Score - 08/21/17 1547      Knowledge Questionnaire Score   Pre Score  22/24       Core Components/Risk Factors/Patient Goals at Admission: Personal Goals and Risk Factors at Admission - 08/21/17 1609      Core Components/Risk Factors/Patient Goals on Admission    Weight Management  Yes;Weight  Loss    Intervention  Weight Management: Provide education and appropriate resources to help participant work on and attain dietary goals.;Weight Management: Develop a combined nutrition and exercise program designed to reach desired caloric intake, while maintaining appropriate intake of nutrient and fiber, sodium and fats, and appropriate energy expenditure required for the weight goal.    Admit Weight  201 lb 15.1 oz (91.6 kg)    Goal Weight: Short Term  195 lb (88.5 kg)    Goal Weight: Long Term  190 lb (86.2 kg)    Expected Outcomes  Short Term: Continue to assess and modify interventions until short term weight is achieved;Weight Loss: Understanding of general recommendations for a balanced deficit meal plan, which promotes 1-2 lb weight loss per week and includes a negative energy balance of [phone] kcal/d;Understanding recommendations for meals to include 15-35% energy as protein, 25-35% energy from fat, 35-60% energy from carbohydrates, less than 200mg  of dietary cholesterol, 20-35 gm of total fiber daily;Understanding of distribution of calorie intake throughout the day with the consumption of 4-5 meals/snacks;Long Term: Adherence to nutrition and physical activity/exercise program aimed toward attainment of established weight goal    Diabetes  Yes    Intervention  Provide education about proper nutrition, including hydration, and aerobic/resistive exercise prescription along with prescribed medications to achieve blood glucose in normal ranges: Fasting glucose 65-99 mg/dL;Provide education about signs/symptoms and action to take for hypo/hyperglycemia.    Expected Outcomes  Short Term: Participant verbalizes understanding of the signs/symptoms and immediate care of hyper/hypoglycemia, proper foot care and importance of medication, aerobic/resistive exercise and nutrition plan for blood glucose control.;Long  Term: Attainment of HbA1C < 7%.    Lipids  Yes    Intervention  Provide education and  support for participant on nutrition & aerobic/resistive exercise along with prescribed medications to achieve LDL 70mg , HDL >40mg .    Expected Outcomes  Short Term: Participant states understanding of desired cholesterol values and is compliant with medications prescribed. Participant is following exercise prescription and nutrition guidelines.;Long Term: Cholesterol controlled with medications as prescribed, with individualized exercise RX and with personalized nutrition plan. Value goals: LDL < 70mg , HDL > 40 mg.       Core Components/Risk Factors/Patient Goals Review:  Goals and Risk Factor Review    Row Name 09/27/17 1622 10/23/17 1645           Core Components/Risk Factors/Patient Goals Review   Personal Goals Review  Weight Management/Obesity;Lipids;Diabetes  Weight Management/Obesity;Lipids;Diabetes      Review  Nathan vital signs and CBG's have been stable at cardiac rehab.  Ora vital signs and CBG's have been stable at cardiac rehab.      Expected Outcomes  Thurlow will continue to participate in phase 2 cardiac rehab and take his medicaitons as presribed.  Elian will continue to participate in phase 2 cardiac rehab and take his medicaitons as presribed.         Core Components/Risk Factors/Patient Goals at Discharge (Final Review):  Goals and Risk Factor Review - 10/23/17 1645      Core Components/Risk Factors/Patient Goals Review   Personal Goals Review  Weight Management/Obesity;Lipids;Diabetes    Review  Taro vital signs and CBG's have been stable at cardiac rehab.    Expected Outcomes  Sebert will continue to participate in phase 2 cardiac rehab and take his medicaitons as presribed.       ITP Comments: ITP Comments    Row Name 08/21/17 1342 08/21/17 1549 08/31/17 1314 09/27/17 1620 10/23/17 1645   ITP Comments  Dr. Fransico Him, Medical Director  Dr. Fransico Him Medical Director   30 DAY ITP Review. Patient started exercise today  30 DAY ITP Review.  Patient with good attendance and participation at cardiac rehab.  30 DAY ITP Review. Patient with good attendance and participation at cardiac rehab.      Comments: See ITP comments.Barnet Pall, RN,BSN 10/24/2017 4:20 PM

## 2017-10-26 ENCOUNTER — Encounter (HOSPITAL_COMMUNITY)
Admission: RE | Admit: 2017-10-26 | Discharge: 2017-10-26 | Disposition: A | Discharge status: Home / Self Care | Payer: Medicare HMO | Source: Ambulatory Visit | Attending: Cardiology | Admitting: Cardiology

## 2017-10-26 DIAGNOSIS — Z955 Presence of coronary angioplasty implant and graft: Secondary | ICD-10-CM | POA: Diagnosis not present

## 2017-10-29 ENCOUNTER — Encounter (HOSPITAL_COMMUNITY)
Admission: RE | Admit: 2017-10-29 | Discharge: 2017-10-29 | Disposition: A | Discharge status: Home / Self Care | Payer: Medicare HMO | Source: Ambulatory Visit | Attending: Cardiology | Admitting: Cardiology

## 2017-10-29 DIAGNOSIS — Z955 Presence of coronary angioplasty implant and graft: Secondary | ICD-10-CM | POA: Diagnosis not present

## 2017-11-02 ENCOUNTER — Encounter (HOSPITAL_COMMUNITY)
Admission: RE | Admit: 2017-11-02 | Discharge: 2017-11-02 | Disposition: A | Discharge status: Home / Self Care | Payer: Medicare HMO | Source: Ambulatory Visit | Attending: Cardiology | Admitting: Cardiology

## 2017-11-02 DIAGNOSIS — Z87891 Personal history of nicotine dependence: Secondary | ICD-10-CM | POA: Insufficient documentation

## 2017-11-02 DIAGNOSIS — E119 Type 2 diabetes mellitus without complications: Secondary | ICD-10-CM | POA: Diagnosis not present

## 2017-11-02 DIAGNOSIS — Z955 Presence of coronary angioplasty implant and graft: Secondary | ICD-10-CM | POA: Insufficient documentation

## 2017-11-02 DIAGNOSIS — Z79899 Other long term (current) drug therapy: Secondary | ICD-10-CM | POA: Diagnosis not present

## 2017-11-02 DIAGNOSIS — Z7984 Long term (current) use of oral hypoglycemic drugs: Secondary | ICD-10-CM | POA: Insufficient documentation

## 2017-11-02 DIAGNOSIS — I10 Essential (primary) hypertension: Secondary | ICD-10-CM | POA: Insufficient documentation

## 2017-11-02 DIAGNOSIS — Z7982 Long term (current) use of aspirin: Secondary | ICD-10-CM | POA: Diagnosis not present

## 2017-11-02 DIAGNOSIS — I252 Old myocardial infarction: Secondary | ICD-10-CM | POA: Insufficient documentation

## 2017-11-05 ENCOUNTER — Encounter (HOSPITAL_COMMUNITY)
Admission: RE | Admit: 2017-11-05 | Discharge: 2017-11-05 | Disposition: A | Discharge status: Home / Self Care | Payer: Medicare HMO | Source: Ambulatory Visit | Attending: Cardiology | Admitting: Cardiology

## 2017-11-05 DIAGNOSIS — Z955 Presence of coronary angioplasty implant and graft: Secondary | ICD-10-CM

## 2017-11-09 ENCOUNTER — Encounter (HOSPITAL_COMMUNITY)
Admission: RE | Admit: 2017-11-09 | Discharge: 2017-11-09 | Disposition: A | Discharge status: Home / Self Care | Payer: Medicare HMO | Source: Ambulatory Visit | Attending: Cardiology | Admitting: Cardiology

## 2017-11-09 DIAGNOSIS — Z955 Presence of coronary angioplasty implant and graft: Secondary | ICD-10-CM

## 2017-11-12 ENCOUNTER — Telehealth (HOSPITAL_COMMUNITY): Payer: Self-pay | Admitting: *Deleted

## 2017-11-12 ENCOUNTER — Encounter (HOSPITAL_COMMUNITY): Payer: Medicare HMO

## 2017-11-16 ENCOUNTER — Encounter (HOSPITAL_COMMUNITY)
Admission: RE | Admit: 2017-11-16 | Discharge: 2017-11-16 | Disposition: A | Discharge status: Home / Self Care | Payer: Medicare HMO | Source: Ambulatory Visit | Attending: Cardiology | Admitting: Cardiology

## 2017-11-16 DIAGNOSIS — Z955 Presence of coronary angioplasty implant and graft: Secondary | ICD-10-CM

## 2017-11-19 ENCOUNTER — Encounter (HOSPITAL_COMMUNITY)
Admission: RE | Admit: 2017-11-19 | Discharge: 2017-11-19 | Disposition: A | Discharge status: Home / Self Care | Payer: Medicare HMO | Source: Ambulatory Visit | Attending: Cardiology | Admitting: Cardiology

## 2017-11-19 DIAGNOSIS — Z955 Presence of coronary angioplasty implant and graft: Secondary | ICD-10-CM

## 2017-11-21 NOTE — Progress Notes (Signed)
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  alfuzosin (UROXATRAL) 10 MG 24 hr tablet, Take 10 mg by mouth daily with supper., Disp: , Rfl:  .  allopurinol (ZYLOPRIM) 300 MG tablet, Take 300 mg by mouth daily with lunch., Disp: , Rfl:  .  aspirin EC 81 MG tablet, Take 81 mg by mouth daily with breakfast., Disp: , Rfl:  .  clopidogrel (PLAVIX) 75 MG tablet, Take 1 tablet (75 mg total) by mouth daily with breakfast., Disp: 90 tablet, Rfl: 2 .  isosorbide mononitrate (IMDUR) 30 MG 24 hr tablet, Take 30 mg by mouth daily., Disp: , Rfl:  .  metFORMIN (GLUCOPHAGE-XR) 500 MG 24 hr tablet, Take 1,000 mg by mouth daily with supper., Disp: , Rfl:  .  metoprolol succinate (TOPROL-XL) 25 MG 24 hr tablet, Take 25 mg by mouth daily with breakfast., Disp: , Rfl:  .  nitroGLYCERIN (NITROSTAT) 0.4 MG SL tablet, Place 0.4 mg under the tongue every 5 (five) minutes x 3 doses as needed. For chest pain., Disp: , Rfl:  .  ramipril (ALTACE) 2.5 MG capsule, Take 5 mg daily with breakfast by mouth. , Disp: , Rfl:  .  REPATHA SURECLICK 654 MG/ML SOAJ, Inject 140 mg into the skin every 14 (fourteen) days., Disp: , Rfl:   Past Medical History: Past Medical History:  Diagnosis Date  . Abnormal stress test 05/18/2017  . Arthritis    "right knee" (05/24/2017)  . Chest pain   . Coronary artery disease   . Gout   . High cholesterol   .  History of kidney stones   . Hypertension   . Myocardial infarction ([place]) 04/2017  . Pneumonia 1990s X 1  . Statin intolerance    Used >2 different statins  . Type II diabetes mellitus (HCC)     Tobacco Use: Social History   Tobacco Use  Smoking Status Former Smoker  . Packs/day: 1.00  . Years: 30.00  . Pack years: 30.00  . Types: Cigarettes  . Last attempt to quit: 2003  . Years since quitting: 16.1  Smokeless Tobacco Never Used    Labs: Recent Review Flowsheet Data    There is no flowsheet data to display.      Capillary Blood Glucose: Lab Results  Component Value Date   GLUCAP 119 (H) 09/07/2017   GLUCAP 206 (H) 09/07/2017   GLUCAP 129 (H) 09/03/2017   GLUCAP 120 (H) 09/03/2017   GLUCAP 104 (H) 08/31/2017     Exercise Target Goals:    Exercise Program Goal: Individual exercise prescription set using results from initial 6 min walk test and THRR while considering  patient's activity barriers and safety.   Exercise Prescription Goal: Initial exercise prescription builds to 30-45 minutes a day of aerobic activity, 2-3 days per week.  Home exercise guidelines will be given to patient during program as part of exercise prescription that the  participant will acknowledge.  Activity Barriers & Risk Stratification: Activity Barriers & Cardiac Risk Stratification - 08/21/17 1554      Activity Barriers & Cardiac Risk Stratification   Activity Barriers  Other (comment)    Comments  R knee pain    Cardiac Risk Stratification  High       6 Minute Walk: 6 Minute Walk    Row Name 08/21/17 1537 08/21/17 1540 08/21/17 1547     6 Minute Walk   Phase  Initial  -  Initial   Distance  1671 feet  -  1671 feet   Walk Time  6 minutes  -  6 minutes   # of Rest Breaks  0  -  0   MPH  3.16  -  3.16   METS  3.45  -  3.45   RPE  12  -  12   Perceived Dyspnea   0  -  0   VO2 Peak  12.09  -  12.09   Symptoms  -  No  -   Resting HR  74 bpm  -  74 bpm   Resting BP  104/62   -  104/62   Resting Oxygen Saturation   98 %  -  98 %   Exercise Oxygen Saturation  during 6 min walk  96 %  -  96 %   Max Ex. HR  99 bpm  -  99 bpm   Max Ex. BP  122/62  -  122/62   2 Minute Post BP  128/62  -  128/82      Oxygen Initial Assessment:   Oxygen Re-Evaluation:   Oxygen Discharge (Final Oxygen Re-Evaluation):   Initial Exercise Prescription: Initial Exercise Prescription - 08/21/17 1500      Date of Initial Exercise RX and Referring Provider   Date  08/21/17    Referring Provider  Shirlee More MD      Treadmill   MPH  2.7    Grade  0    Minutes  10    METs  3.07      Bike   Level  0.8    Minutes  10    METs  2.68      NuStep   Level  3    SPM  80    Minutes  10    METs  2.5      Prescription Details   Frequency (times per week)  3    Duration  Progress to 30 minutes of continuous aerobic without signs/symptoms of physical distress      Intensity   THRR 40-80% of Max Heartrate  60-120    Ratings of Perceived Exertion  11-13    Perceived Dyspnea  0-4      Progression   Progression  Continue progressive overload as per policy without signs/symptoms or physical distress.      Resistance Training   Training Prescription  Yes    Weight  3lbs    Reps  10-15       Perform Capillary Blood Glucose checks as needed.  Exercise Prescription Changes:  Exercise Prescription Changes    Row Name 08/31/17 1300 09/07/17 1422 09/17/17 1420 10/08/17 0905 10/22/17 0906     Response to Exercise   Blood Pressure (Admit)  110/62  120/70  124/70  104/54  104/60   Blood Pressure (Exercise)  128/76  132/78  134/74  134/70  148/82   Blood Pressure (Exit)  120/62  112/70  112/74  114/62  113/64   Heart Rate (Admit)  71 bpm  72 bpm  80 bpm  78 bpm  67 bpm   Heart Rate (Exercise)  92 bpm  99 bpm  117 bpm  122 bpm  109 bpm   Heart Rate (Exit)  91 bpm  72 bpm  80 bpm  78 bpm  77 bpm   Rating of Perceived Exertion (Exercise)  '11  11  12  12  13   ' Symptoms  -   none  none  None  None   Duration  Continue with 30 min of aerobic exercise without signs/symptoms of physical distress.  Continue with 30 min of aerobic exercise without signs/symptoms of physical distress.  Continue with 30 min of aerobic exercise without signs/symptoms of physical distress.  Continue with 30 min of aerobic exercise without signs/symptoms of physical distress.  Continue with 30 min of aerobic exercise without signs/symptoms of physical distress.   Intensity  THRR unchanged  THRR unchanged  THRR unchanged  THRR unchanged  THRR unchanged     Progression   Progression  Continue to progress workloads to maintain intensity without signs/symptoms of physical distress.  Continue to progress workloads to maintain intensity without signs/symptoms of physical distress.  Continue to progress workloads to maintain intensity without signs/symptoms of physical distress.  Continue to progress workloads to maintain intensity without signs/symptoms of physical distress.  Continue to progress workloads to maintain intensity without signs/symptoms of physical distress.   Average METs  2.7  2.9  3.3  4.4  4.4     Resistance Training   Training Prescription  Yes  Yes  Yes  Yes  Yes   Weight  3lbs  4lbs  4lbs  4lbs  4lbs   Reps  10-15  10-15  10-15  10-15  10-15   Time  -  10 Minutes  10 Minutes  10 Minutes  10 Minutes     Treadmill   MPH  2.7  2.7  2.'7  3  3   ' Grade  0  0  0  2  2   Minutes  '10  10  10  10  10   ' METs  3.07  3.07  3.07  4.12  4.12     Bike   Level  0.8  0.8  1.3  1.6  1.8   Minutes  '10  10  10  10  10   ' METs  2.68  2.68  3.67  4.27  4.71     NuStep   Level  '4  4  5  6  6   ' SPM  80  80  85  85  85   Minutes  '10  10  10  10  10   ' METs  2.6  3.2  3.2  4.8  4.5     Home Exercise Plan   Plans to continue exercise at  -  [employer] (comment) [place] (comment) [place] (comment) [place] (comment) [employer]   Frequency  -   Add 2 additional days to program exercise sessions.  Add 2 additional days to program exercise sessions.  Add 2 additional days to program exercise sessions.  Add 2 additional days to program exercise sessions.   Initial Home Exercises Provided  -  09/07/17  09/07/17  09/07/17  09/07/17   Row Name 11/16/17 1053 11/19/17 1631  Response to Exercise   Blood Pressure (Admit)  108/80  100/60      Blood Pressure (Exercise)  122/80  158/74      Blood Pressure (Exit)  110/64  118/62      Heart Rate (Admit)  73 bpm  86 bpm      Heart Rate (Exercise)  122 bpm  137 bpm      Heart Rate (Exit)  77 bpm  96 bpm      Rating of Perceived Exertion (Exercise)  14  13      Symptoms  None  None      Duration  Progress to 45 minutes of aerobic exercise without signs/symptoms of physical distress  Progress to 45 minutes of aerobic exercise without signs/symptoms of physical distress      Intensity  THRR unchanged  THRR unchanged        Progression   Progression  Continue to progress workloads to maintain intensity without signs/symptoms of physical distress.  Continue to progress workloads to maintain intensity without signs/symptoms of physical distress.      Average METs  5.06  5.26        Resistance Training   Training Prescription  Yes  Yes      Weight  5lbs  5lbs      Reps  10-15  10-15      Time  10 Minutes  10 Minutes        Interval Training   Interval Training  No  No        Treadmill   MPH  3  3.2      Grade  3  4      Minutes  10  10      METs  4.54  5.21        Bike   Level  2  2      Minutes  10  10      METs  5.13  5.16        NuStep   Level  6  6      SPM  85  85      Minutes  10  10      METs  5.5  5.4        Home Exercise Plan   Plans to continue exercise at  [employer] (comment) [place] (comment) [employer], [employer]      Frequency  Add 2 additional days to program exercise sessions.  Add 3 additional days to program exercise  sessions.      Initial Home Exercises Provided  09/07/17  09/07/17         Exercise Comments:  Exercise Comments    Row Name 08/31/17 1344 09/07/17 1457 09/18/17 1429 10/24/17 0908 11/09/17 1618   Exercise Comments  Today was the patient first day. Patient was oriented to exercise equipment. Understands RPE scale. Will cont. to monitor and progress as able.   Reviewed Home Exercise Plan with patient today. Pt plans to walk and continue [employer] water aerobics at local [employer]. Will continue to monitor and progress pt.   Reviewed METs and goals. Pt is making great progress in cardiac rehab. Pt's activity will be progressed based on signs/symptoms of physical distress and exercise/activity tolerance.   Reviewed METs and goals with patient. Pt is responding very well to workload increases. Pt is making great program and is not able to walk with 2% incline on treadmill.   Reveiwed  METs with pt. Will continue to monitor and progress pt.    [place] Name 11/21/17 1633           Exercise Comments  Reviewed Goals with pt. Pt is responding well to increase workloads and is making great progress in rehab.           Exercise Goals and Review:  Exercise Goals    Row Name 08/21/17 1351             Exercise Goals   Increase Physical Activity  Yes       Intervention  Provide advice, education, support and counseling about physical activity/exercise needs.;Develop an individualized exercise prescription for aerobic and resistive training based on initial evaluation findings, risk stratification, comorbidities and participant's personal goals.       Expected Outcomes  Achievement of increased cardiorespiratory fitness and enhanced flexibility, muscular endurance and strength shown through measurements of functional capacity and personal statement of participant.       Increase Strength and Stamina  Yes       Intervention  Provide advice, education, support and counseling about physical activity/exercise  needs.;Develop an individualized exercise prescription for aerobic and resistive training based on initial evaluation findings, risk stratification, comorbidities and participant's personal goals.       Expected Outcomes  Achievement of increased cardiorespiratory fitness and enhanced flexibility, muscular endurance and strength shown through measurements of functional capacity and personal statement of participant.       Able to understand and use rate of perceived exertion (RPE) scale  Yes       Intervention  Provide education and explanation on how to use RPE scale       Expected Outcomes  Short Term: Able to use RPE daily in rehab to express subjective intensity level;Long Term:  Able to use RPE to guide intensity level when exercising independently       Knowledge and understanding of Target Heart Rate Range (THRR)  Yes       Intervention  Provide education and explanation of THRR including how the numbers were predicted and where they are located for reference       Expected Outcomes  Short Term: Able to state/look up THRR;Long Term: Able to use THRR to govern intensity when exercising independently;Short Term: Able to use daily as guideline for intensity in rehab       Able to check pulse independently  Yes       Intervention  Provide education and demonstration on how to check pulse in carotid and radial arteries.;Review the importance of being able to check your own pulse for safety during independent exercise       Expected Outcomes  Short Term: Able to explain why pulse checking is important during independent exercise;Long Term: Able to check pulse independently and accurately       Understanding of Exercise Prescription  Yes       Intervention  Provide education, explanation, and written materials on patient's individual exercise prescription       Expected Outcomes  Short Term: Able to explain program exercise prescription;Long Term: Able to explain home exercise prescription to exercise  independently          Exercise Goals Re-Evaluation : Exercise Goals Re-Evaluation    Row Name 08/31/17 1345 09/18/17 1428 10/24/17 0909 11/21/17 1634       Exercise Goal Re-Evaluation   Exercise Goals Review  Able to understand and use rate of perceived exertion (RPE) scale;Increase Physical Activity  Increase Physical Activity;Able to understand and use rate of perceived exertion (RPE) scale;Knowledge and understanding of Target Heart Rate Range (THRR);Understanding of Exercise Prescription;Increase Strength and Stamina;Able to check pulse independently  Increase Physical Activity;Able to understand and use rate of perceived exertion (RPE) scale;Knowledge and understanding of Target Heart Rate Range (THRR);Understanding of Exercise Prescription;Increase Strength and Stamina;Able to check pulse independently  Understanding of Exercise Prescription    Comments  Patient was originally prescribed Level 3 on the Stepper--was able to work at a level of 4. States that he understands RPE scale.  Pt is making great progress in cardiac rehab. Pt tolerates WL increases very well and feels as though he is getting stronger.  Pt is tolerating the workload increases well and is continuing to improve his cardiorespiratory fitness. Pt states he is feeling stronger as well.   Pt has been consistently increasing his MET level on the different exercise equipment and responding very well to increases in his exercise prescription. Will continue to monitor and progress pt.     Expected Outcomes  Through exercise at rehab and at home, patient will increase strength, stamina, and physical capacity.   Through exercise at rehab and at home, patient will increase strength, stamina, and physical capacity.   Pt is making great progress. Pt is exercising at home and in rehab and is very motivated to continue progressing.   Pt will continue to exericse 3 days a [facility] at local [employer] in addition to 2 days at rehab. Pt will continue to  increase his cardiorespiratory fitness.         Discharge Exercise Prescription (Final Exercise Prescription Changes): Exercise Prescription Changes - 11/19/17 1631      Response to Exercise   Blood Pressure (Admit)  100/60    Blood Pressure (Exercise)  158/74    Blood Pressure (Exit)  118/62    Heart Rate (Admit)  86 bpm    Heart Rate (Exercise)  137 bpm    Heart Rate (Exit)  96 bpm    Rating of Perceived Exertion (Exercise)  13    Symptoms  None    Duration  Progress to 45 minutes of aerobic exercise without signs/symptoms of physical distress    Intensity  THRR unchanged      Progression   Progression  Continue to progress workloads to maintain intensity without signs/symptoms of physical distress.    Average METs  5.26      Resistance Training   Training Prescription  Yes    Weight  5lbs    Reps  10-15    Time  10 Minutes      Interval Training   Interval Training  No      Treadmill   MPH  3.2    Grade  4    Minutes  10    METs  5.21      Bike   Level  2    Minutes  10    METs  5.16      NuStep   Level  6    SPM  85    Minutes  10    METs  5.4      Home Exercise Plan   Plans to continue exercise at  [employer] [facility] [employer] (comment) [employer], [employer]    Frequency  Add 3 additional days to program exercise sessions.    Initial Home Exercises Provided  09/07/17       Nutrition:  Target Goals: Understanding of nutrition guidelines, daily  intake of sodium <1549m, cholesterol <2073m calories 30% from fat and 7% or less from saturated fats, daily to have 5 or more servings of fruits and vegetables.  Biometrics: Pre Biometrics - 08/21/17 1631      Pre Biometrics   Height  5' 9.75" (1.772 m)    Weight  201 lb 15.1 oz (91.6 kg)    Waist Circumference  41 inches    Hip Circumference  41.75 inches    Waist to Hip Ratio  0.98 %    BMI (Calculated)  29.17    Triceps Skinfold  17 mm    % Body Fat  29 %    Grip Strength  39 kg    Flexibility  13 in     Single Leg Stand  24.03 seconds        Nutrition Therapy Plan and Nutrition Goals: Nutrition Therapy & Goals - 08/21/17 1509      Nutrition Therapy   Diet  Carb Modified, Heart Healthy      Personal Nutrition Goals   Nutrition Goal  Pt to identify food quantities necessary to achieve weight loss of 6-24 lb at graduation from cardiac rehab. Goal wt of 185 lb desired.     Personal Goal #2  Pt to identify and limit food sources of saturated fat, trans fat, and sodium    Personal Goal #3  CBG concentrations in the normal range or as close to normal as is safely possible.      Intervention Plan   Intervention  Prescribe, educate and counsel regarding individualized specific dietary modifications aiming towards targeted core components such as weight, hypertension, lipid management, diabetes, heart failure and other comorbidities.    Expected Outcomes  Short Term Goal: Understand basic principles of dietary content, such as calories, fat, sodium, cholesterol and nutrients.;Long Term Goal: Adherence to prescribed nutrition plan.       Nutrition Assessments: Nutrition Assessments - 08/21/17 1509      MEDFICTS Scores   Pre Score  83       Nutrition Goals Re-Evaluation:   Nutrition Goals Re-Evaluation:   Nutrition Goals Discharge (Final Nutrition Goals Re-Evaluation):   Psychosocial: Target Goals: Acknowledge presence or absence of significant depression and/or stress, maximize coping skills, provide positive support system. Participant is able to verbalize types and ability to use techniques and skills needed for reducing stress and depression.  Initial Review & Psychosocial Screening: Initial Psych Review & Screening - 08/21/17 1413      Initial Review   Current issues with  None Identified      Family Dynamics   Good Support System?  Yes spouse, cousin, church family    Comments  no psychosocial needs identified, no interventions necessary      Barriers   Psychosocial  barriers to participate in program  There are no identifiable barriers or psychosocial needs.      Screening Interventions   Interventions  Encouraged to exercise;Provide feedback about the scores to participant       Quality of Life Scores: Quality of Life - 08/21/17 1451      Quality of Life Scores   Health/Function Pre  18.47 %    Socioeconomic Pre  23.56 %    Psych/Spiritual Pre  19.79 %    Family Pre  21.8 %    GLOBAL Pre  20.37 %      Scores of 19 and below usually indicate a poorer quality of life in these areas.  A difference of  2-3 points is a clinically meaningful difference.  A difference of 2-3 points in the total score of the Quality of Life Index has been associated with significant improvement in overall quality of life, self-image, physical symptoms, and general health in studies assessing change in quality of life.  PHQ-9: Recent Review Flowsheet Data    Depression screen [facility] 2/9 08/31/2017   Decreased Interest 0   Down, Depressed, Hopeless 0   PHQ - 2 Score 0     Interpretation of Total Score  Total Score Depression Severity:  1-4 = Minimal depression, 5-9 = Mild depression, 10-14 = Moderate depression, 15-19 = Moderately severe depression, 20-27 = Severe depression   Psychosocial Evaluation and Intervention:   Psychosocial Re-Evaluation: Psychosocial Re-Evaluation    [place] Name 09/27/17 1626 10/23/17 1646 11/21/17 0855         Psychosocial Re-Evaluation   Current issues with  None Identified  None Identified  None Identified     Interventions  Encouraged to attend Cardiac Rehabilitation for the exercise  Encouraged to attend Cardiac Rehabilitation for the exercise  Encouraged to attend Cardiac Rehabilitation for the exercise     Continue Psychosocial Services   No Follow up required  No Follow up required  No Follow up required        Psychosocial Discharge (Final Psychosocial Re-Evaluation): Psychosocial Re-Evaluation - 11/21/17 0855       Psychosocial Re-Evaluation   Current issues with  None Identified    Interventions  Encouraged to attend Cardiac Rehabilitation for the exercise    Continue Psychosocial Services   No Follow up required       Vocational Rehabilitation: Provide vocational rehab assistance to qualifying candidates.   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 Participant will state understanding/return demonstration of topics presented.  Learning Barriers/Preferences: Learning Barriers/Preferences - 08/21/17 1552      Learning Barriers/Preferences   Learning Barriers  Sight    Learning Preferences  Written Material       Education Topics: Count Your Pulse:  -Group instruction provided by verbal instruction, demonstration, patient participation and written materials to support subject.  Instructors address importance of being able to find your pulse and how to count your pulse when at home without a heart monitor.  Patients get hands on experience counting their pulse with staff help and individually.   CARDIAC REHAB PHASE II EXERCISE from 11/02/2017 in [place]  Date  10/26/17  Educator  ep      Heart Attack, Angina, and Risk Factor Modification:  -Group instruction provided by verbal instruction, video, and written materials to support subject.  Instructors address signs and symptoms of angina and heart attacks.    Also discuss risk factors for heart disease and how to make changes to improve heart health risk factors.   Functional Fitness:  -Group instruction provided by verbal instruction, demonstration, patient participation, and written materials to support subject.  Instructors  address safety measures for doing things around the house.  Discuss how to get up and down off the floor, how to pick things up properly, how to safely get out of a chair without assistance, and balance training.   CARDIAC REHAB PHASE II EXERCISE from  11/02/2017 in [place]  Date  09/28/17      Meditation and Mindfulness:  -Group instruction provided by verbal instruction, patient participation, and written materials to support subject.  Instructor addresses importance of mindfulness and meditation practice to help reduce stress and improve awareness.  Instructor also leads participants through a meditation exercise.    Stretching for Flexibility and Mobility:  -Group instruction provided by verbal instruction, patient participation, and written materials to support subject.  Instructors lead participants through series of stretches that are designed to increase flexibility thus improving mobility.  These stretches are additional exercise for major muscle groups that are typically performed during regular warm up and cool down.   CARDIAC REHAB PHASE II EXERCISE from 11/02/2017 in [place]  Date  10/05/17      Hands Only CPR:  -Group verbal, video, and participation provides a basic overview of AHA guidelines for community CPR. Role-play of emergencies allow participants the opportunity to practice calling for help and chest compression technique with discussion of AED use.   CARDIAC REHAB PHASE II EXERCISE from 11/02/2017 in [place]  Date  11/02/17  Instruction Review Code  2- Demonstrated Understanding      Hypertension: -Group verbal and written instruction that provides a basic overview of hypertension including the most recent diagnostic guidelines, risk factor reduction with self-care instructions and medication management.    Nutrition I class: Heart Healthy Eating:  -Group  instruction provided by PowerPoint slides, verbal discussion, and written materials to support subject matter. The instructor gives an explanation and review of the Therapeutic Lifestyle Changes diet recommendations, which includes a discussion on lipid goals, dietary fat, sodium, fiber, plant stanol/sterol esters, sugar, and the components of a well-balanced, healthy diet.   CARDIAC REHAB PHASE II EXERCISE from 11/02/2017 in [place]  Date  08/21/17 [place] handouts given]  Educator  RD      Nutrition II class: Lifestyle Skills:  -Group instruction provided by PowerPoint slides, verbal discussion, and written materials to support subject matter. The instructor gives an explanation and review of label reading, grocery shopping for heart health, heart healthy recipe modifications, and ways to make healthier choices when eating out.   CARDIAC REHAB PHASE II EXERCISE from 11/02/2017 in [place]  Date  08/21/17 [place] handouts given]  Educator  RD      Diabetes Question & Answer:  -Group instruction provided by PowerPoint slides, verbal discussion, and written materials to support subject matter. The instructor gives an explanation and review of diabetes co-morbidities, pre- and post-prandial blood glucose goals, pre-exercise blood glucose goals, signs, symptoms, and treatment of hypoglycemia and hyperglycemia, and foot care basics.   CARDIAC REHAB PHASE II EXERCISE from 11/02/2017 in [place]  Date  10/19/17  Educator  RD      Diabetes Blitz:  -Group instruction provided by PowerPoint slides, verbal discussion, and written materials to support subject matter. The instructor gives an explanation and review of the physiology behind type 1 and type 2 diabetes, diabetes medications and rational behind using different medications, pre- and post-prandial blood glucose recommendations and Hemoglobin A1c  goals, diabetes diet, and exercise including blood glucose guidelines for exercising safely.    Portion Distortion:  -Group instruction provided by PowerPoint slides, verbal discussion, written materials, and food models to support subject matter. The instructor gives an explanation of serving size  versus portion size, changes in portions sizes over the last 20 years, and what consists of a serving from each food group.   Stress Management:  -Group instruction provided by verbal instruction, video, and written materials to support subject matter.  Instructors review role of stress in heart disease and how to cope with stress positively.     Exercising on Your Own:  -Group instruction provided by verbal instruction, power point, and written materials to support subject.  Instructors discuss benefits of exercise, components of exercise, frequency and intensity of exercise, and end points for exercise.  Also discuss use of nitroglycerin and activating EMS.  Review options of places to exercise outside of rehab.  Review guidelines for sex with heart disease.   Cardiac Drugs I:  -Group instruction provided by verbal instruction and written materials to support subject.  Instructor reviews cardiac drug classes: antiplatelets, anticoagulants, beta blockers, and statins.  Instructor discusses reasons, side effects, and lifestyle considerations for each drug class.   Cardiac Drugs II:  -Group instruction provided by verbal instruction and written materials to support subject.  Instructor reviews cardiac drug classes: angiotensin converting enzyme inhibitors (ACE-I), angiotensin II receptor blockers (ARBs), nitrates, and calcium channel blockers.  Instructor discusses reasons, side effects, and lifestyle considerations for each drug class.   Anatomy and Physiology of the Circulatory System:  Group verbal and written instruction and models provide basic cardiac anatomy and physiology, with the coronary  electrical and arterial systems. Review of: AMI, Angina, Valve disease, Heart Failure, Peripheral Artery Disease, Cardiac Arrhythmia, Pacemakers, and the ICD.   Other Education:  -Group or individual verbal, written, or video instructions that support the educational goals of the cardiac rehab program.   Holiday Eating Survival Tips:  -Group instruction provided by PowerPoint slides, verbal discussion, and written materials to support subject matter. The instructor gives patients tips, tricks, and techniques to help them not only survive but enjoy the holidays despite the onslaught of food that accompanies the holidays.   Knowledge Questionnaire Score: Knowledge Questionnaire Score - 08/21/17 1547      Knowledge Questionnaire Score   Pre Score  22/24       Core Components/Risk Factors/Patient Goals at Admission: Personal Goals and Risk Factors at Admission - 08/21/17 1609      Core Components/Risk Factors/Patient Goals on Admission    Weight Management  Yes;Weight Loss    Intervention  Weight Management: Provide education and appropriate resources to help participant work on and attain dietary goals.;Weight Management: Develop a combined nutrition and exercise program designed to reach desired caloric intake, while maintaining appropriate intake of nutrient and fiber, sodium and fats, and appropriate energy expenditure required for the weight goal.    Admit Weight  201 lb 15.1 oz (91.6 kg)    Goal Weight: Short Term  195 lb (88.5 kg)    Goal Weight: Long Term  190 lb (86.2 kg)    Expected Outcomes  Short Term: Continue to assess and modify interventions until short term weight is achieved;Weight Loss: Understanding of general recommendations for a balanced deficit meal plan, which promotes 1-2 lb weight loss per week and includes a negative energy balance of [phone] kcal/d;Understanding recommendations for meals to include 15-35% energy as protein, 25-35% energy from fat, 35-60% energy  from carbohydrates, less than 263m of dietary cholesterol, 20-35 gm of total fiber daily;Understanding of distribution of calorie intake throughout the day with the consumption of 4-5 meals/snacks;Long Term: Adherence to nutrition and physical activity/exercise program aimed toward  attainment of established weight goal    Diabetes  Yes    Intervention  Provide education about proper nutrition, including hydration, and aerobic/resistive exercise prescription along with prescribed medications to achieve blood glucose in normal ranges: Fasting glucose 65-99 mg/dL;Provide education about signs/symptoms and action to take for hypo/hyperglycemia.    Expected Outcomes  Short Term: Participant verbalizes understanding of the signs/symptoms and immediate care of hyper/hypoglycemia, proper foot care and importance of medication, aerobic/resistive exercise and nutrition plan for blood glucose control.;Long Term: Attainment of HbA1C < 7%.    Lipids  Yes    Intervention  Provide education and support for participant on nutrition & aerobic/resistive exercise along with prescribed medications to achieve LDL <57m, HDL >48m    Expected Outcomes  Short Term: Participant states understanding of desired cholesterol values and is compliant with medications prescribed. Participant is following exercise prescription and nutrition guidelines.;Long Term: Cholesterol controlled with medications as prescribed, with individualized exercise RX and with personalized nutrition plan. Value goals: LDL < 7067mHDL > 40 mg.       Core Components/Risk Factors/Patient Goals Review:  Goals and Risk Factor Review    Row Name 09/27/17 1622 10/23/17 1645 11/21/17 0855         Core Components/Risk Factors/Patient Goals Review   Personal Goals Review  Weight Management/Obesity;Lipids;Diabetes  Weight Management/Obesity;Lipids;Diabetes  Weight Management/Obesity;Lipids;Diabetes     Review  Milind vital signs and CBG's have been stable  at cardiac rehab.  Harlyn vital signs and CBG's have been stable at cardiac rehab.  Jovani vital signs and CBG's have been stable at cardiac rehab.     Expected Outcomes  ChaSaharshll continue to participate in phase 2 cardiac rehab and take his medicaitons as presribed.  ChaKylll continue to participate in phase 2 cardiac rehab and take his medicaitons as presribed.  ChaSaheedll continue to participate in phase 2 cardiac rehab and take his medicaitons as presribed.        Core Components/Risk Factors/Patient Goals at Discharge (Final Review):  Goals and Risk Factor Review - 11/21/17 0855      Core Components/Risk Factors/Patient Goals Review   Personal Goals Review  Weight Management/Obesity;Lipids;Diabetes    Review  Dezmon vital signs and CBG's have been stable at cardiac rehab.    Expected Outcomes  ChaYingll continue to participate in phase 2 cardiac rehab and take his medicaitons as presribed.       ITP Comments: ITP Comments    Row Name 08/21/17 1342 08/21/17 1549 08/31/17 1314 09/27/17 1620 10/23/17 1645   ITP Comments  Dr. TraFransico Himedical Director  Dr. TraFransico Himdical Director   30 DAY ITP Review. Patient started exercise today  30 DAY ITP Review. Patient with good attendance and participation at cardiac rehab.  30 DAY ITP Review. Patient with good attendance and participation at cardiac rehab.   RowTorranceme 11/21/17 0855           ITP Comments  30 DAY ITP Review. Patient with good attendance and participation at cardiac rehab.          Comments: See ITP comments.MarBarnet PallN,BSN 11/22/2017 11:01 AM

## 2017-11-23 ENCOUNTER — Encounter (HOSPITAL_COMMUNITY): Payer: Medicare HMO

## 2017-11-26 ENCOUNTER — Encounter (HOSPITAL_COMMUNITY)
Admission: RE | Admit: 2017-11-26 | Discharge: 2017-11-26 | Disposition: A | Discharge status: Home / Self Care | Payer: Medicare HMO | Source: Ambulatory Visit | Attending: Cardiology | Admitting: Cardiology

## 2017-11-26 DIAGNOSIS — Z955 Presence of coronary angioplasty implant and graft: Secondary | ICD-10-CM

## 2017-11-28 ENCOUNTER — Encounter (HOSPITAL_COMMUNITY)
Admission: RE | Admit: 2017-11-28 | Discharge: 2017-11-28 | Disposition: A | Discharge status: Home / Self Care | Payer: Medicare HMO | Source: Ambulatory Visit | Attending: Cardiology | Admitting: Cardiology

## 2017-11-28 DIAGNOSIS — Z955 Presence of coronary angioplasty implant and graft: Secondary | ICD-10-CM

## 2017-11-30 ENCOUNTER — Encounter (HOSPITAL_COMMUNITY)
Admission: RE | Admit: 2017-11-30 | Discharge: 2017-11-30 | Disposition: A | Discharge status: Home / Self Care | Payer: Medicare HMO | Source: Ambulatory Visit | Attending: Cardiology | Admitting: Cardiology

## 2017-11-30 DIAGNOSIS — I252 Old myocardial infarction: Secondary | ICD-10-CM | POA: Diagnosis not present

## 2017-11-30 DIAGNOSIS — I10 Essential (primary) hypertension: Secondary | ICD-10-CM | POA: Insufficient documentation

## 2017-11-30 DIAGNOSIS — Z7984 Long term (current) use of oral hypoglycemic drugs: Secondary | ICD-10-CM | POA: Diagnosis not present

## 2017-11-30 DIAGNOSIS — E119 Type 2 diabetes mellitus without complications: Secondary | ICD-10-CM | POA: Diagnosis not present

## 2017-11-30 DIAGNOSIS — Z955 Presence of coronary angioplasty implant and graft: Secondary | ICD-10-CM

## 2017-11-30 DIAGNOSIS — Z7982 Long term (current) use of aspirin: Secondary | ICD-10-CM | POA: Insufficient documentation

## 2017-11-30 DIAGNOSIS — Z87891 Personal history of nicotine dependence: Secondary | ICD-10-CM | POA: Insufficient documentation

## 2017-11-30 DIAGNOSIS — Z79899 Other long term (current) drug therapy: Secondary | ICD-10-CM | POA: Insufficient documentation

## 2017-12-03 ENCOUNTER — Encounter (HOSPITAL_COMMUNITY)
Admission: RE | Admit: 2017-12-03 | Discharge: 2017-12-03 | Disposition: A | Discharge status: Home / Self Care | Payer: Medicare HMO | Source: Ambulatory Visit | Attending: Cardiology | Admitting: Cardiology

## 2017-12-03 DIAGNOSIS — Z955 Presence of coronary angioplasty implant and graft: Secondary | ICD-10-CM | POA: Diagnosis not present

## 2017-12-05 ENCOUNTER — Encounter (HOSPITAL_COMMUNITY)
Admission: RE | Admit: 2017-12-05 | Discharge: 2017-12-05 | Disposition: A | Discharge status: Home / Self Care | Payer: Medicare HMO | Source: Ambulatory Visit | Attending: Cardiology | Admitting: Cardiology

## 2017-12-05 VITALS — Ht 69.75 in | Wt 199.5 lb

## 2017-12-05 DIAGNOSIS — Z955 Presence of coronary angioplasty implant and graft: Secondary | ICD-10-CM

## 2017-12-05 NOTE — Progress Notes (Signed)
Nicholas Maxwell 72 y.o. male DOB: November 10, 1945 MRN: [id number]      Nutrition Note  1. 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 Meftormin XR noted  Note Spoke with pt per RN request. Pt CBG's dropping significantly with exercise "about 100 points." Pt reports he usually catches his blood sugar around 80 "that's low enough" and has snack. Pt tried 500 mg Metformin XR x 1 week before his MD visit last month. Pt reports his A1c came back as 10.3. Pt states he and his MD suspect A1c may be a lab error even though lab errors are rare due to pt has not been hyperglycemic. Pt is now alternating when he checks his CBG's and his highest post-prandial CBG was "150 mg/dL." Pt resumed 1000 mg Metformin XR after A1c returned high. Pre-exercise meals/snacks discussed to help with better CBG control during exercise. Pt expressed understanding of the information reviewed.  No results found for: HGBA1C  Nutrition Diagnosis ? Food-and nutrition-related knowledge deficit related to lack of exposure to information as related to diagnosis of: ? CVD ? DM ? Overweight related to excessive energy intake as evidenced by a BMI of 27.3  Nutrition Intervention ? Pt's individual nutrition plan reviewed with pt. ? Pt to eat protein with breakfast on exercise days.    Nutrition Goal(s):  ? Pt to identify and limit food sources of saturated fat, trans fat, and sodium ? Pt to identify food quantities necessary to achieve weight loss of 6-24 lb at graduation from cardiac rehab. Goal wt of 185 lb desired.  ? CBG concentrations in the normal range or as close to normal as is safely possible.  Plan:  Pt to attend nutrition classes ? Portion Distortion ? Diabetes Blitz ? Diabetes Q & A - met 10/19/17 Will provide client-centered nutrition education as part of interdisciplinary care.   Monitor and evaluate progress toward nutrition goal with team.  Derek Mound, M.Ed, RD, LDN, CDE 12/05/2017 12:20  PM

## 2017-12-10 ENCOUNTER — Encounter (HOSPITAL_COMMUNITY)
Admission: RE | Admit: 2017-12-10 | Discharge: 2017-12-10 | Disposition: A | Discharge status: Home / Self Care | Payer: Medicare HMO | Source: Ambulatory Visit | Attending: Cardiology | Admitting: Cardiology

## 2017-12-10 DIAGNOSIS — Z955 Presence of coronary angioplasty implant and graft: Secondary | ICD-10-CM | POA: Diagnosis not present

## 2017-12-14 ENCOUNTER — Encounter (HOSPITAL_COMMUNITY)
Admission: RE | Admit: 2017-12-14 | Discharge: 2017-12-14 | Disposition: A | Discharge status: Home / Self Care | Payer: Medicare HMO | Source: Ambulatory Visit | Attending: Cardiology | Admitting: Cardiology

## 2017-12-14 DIAGNOSIS — Z955 Presence of coronary angioplasty implant and graft: Secondary | ICD-10-CM | POA: Diagnosis not present

## 2017-12-14 NOTE — Progress Notes (Signed)
Discharge Progress Report  Patient Details  Name: Nicholas Maxwell MRN: [id number] Date of Birth: 01-Mar-1946 Referring Provider:     [facility] from 08/21/2017 in [place]  Referring Provider  Shirlee More MD       Number of Visits: 29  Reason for Discharge:  Patient independent in their exercise.  Smoking History:  Social History   Tobacco Use  Smoking Status Former Smoker  . Packs/day: 1.00  . Years: 30.00  . Pack years: 30.00  . Types: Cigarettes  . Last attempt to quit: 2003  . Years since quitting: 16.2  Smokeless Tobacco Never Used    Diagnosis:  Stented coronary artery 05/24/17 DES X 2 DES CIRC, LAD  ADL UCSD:   Initial Exercise Prescription: Initial Exercise Prescription - 08/21/17 1500      Date of Initial Exercise RX and Referring Provider   Date  08/21/17    Referring Provider  Shirlee More MD      Treadmill   MPH  2.7    Grade  0    Minutes  10    METs  3.07      Bike   Level  0.8    Minutes  10    METs  2.68      NuStep   Level  3    SPM  80    Minutes  10    METs  2.5      Prescription Details   Frequency (times per week)  3    Duration  Progress to 30 minutes of continuous aerobic without signs/symptoms of physical distress      Intensity   THRR 40-80% of Max Heartrate  60-120    Ratings of Perceived Exertion  11-13    Perceived Dyspnea  0-4      Progression   Progression  Continue progressive overload as per policy without signs/symptoms or physical distress.      Resistance Training   Training Prescription  Yes    Weight  3lbs    Reps  10-15       Discharge Exercise Prescription (Final Exercise Prescription Changes): Exercise Prescription Changes - 12/14/17 1533      Response to Exercise   Blood Pressure (Admit)  122/60    Blood Pressure (Exercise)  128/72    Blood Pressure (Exit)  115/63    Heart Rate (Admit)  90 bpm    Heart Rate (Exercise)  126  bpm    Heart Rate (Exit)  88 bpm    Rating of Perceived Exertion (Exercise)  13    Symptoms  None    Duration  Continue with 45 min of aerobic exercise without signs/symptoms of physical distress.    Intensity  THRR unchanged      Progression   Progression  Continue to progress workloads to maintain intensity without signs/symptoms of physical distress.    Average METs  5.29      Resistance Training   Training Prescription  Yes    Weight  5lbs    Reps  10-15    Time  10 Minutes      Interval Training   Interval Training  No      Treadmill   MPH  3.2    Grade  4    Minutes  10    METs  5.21      Bike   Level  2    Minutes  10  METs  5.17      NuStep   Level  6    SPM  85    Minutes  10    METs  5.5      Home Exercise Plan   Plans to continue exercise at  [employer] (comment) [employer], [employer]    Frequency  Add 4 additional days to program exercise sessions.    Initial Home Exercises Provided  09/07/17       Functional Capacity: 6 Minute Walk    Row Name 08/21/17 1537 08/21/17 1540 08/21/17 1547     6 Minute Walk   Phase  Initial  -  Initial   Distance  1671 feet  -  1671 feet   Walk Time  6 minutes  -  6 minutes   # of Rest Breaks  0  -  0   MPH  3.16  -  3.16   METS  3.45  -  3.45   RPE  12  -  12   Perceived Dyspnea   0  -  0   VO2 Peak  12.09  -  12.09   Symptoms  -  No  -   Resting HR  74 bpm  -  74 bpm   Resting BP  104/62  -  104/62   Resting Oxygen Saturation   98 %  -  98 %   Exercise Oxygen Saturation  during 6 min walk  96 %  -  96 %   Max Ex. HR  99 bpm  -  99 bpm   Max Ex. BP  122/62  -  122/62   2 Minute Post BP  128/62  -  128/82   Row Name 12/05/17 1541         6 Minute Walk   Phase  Discharge     Distance  1800 feet     Distance % Change  7.72 %     Distance Feet Change  129 ft     Walk Time  6 minutes     # of Rest Breaks  0     MPH  3.41     METS  3.8     RPE  12     Perceived Dyspnea   0     VO2 Peak   13.29     Symptoms  No     Resting HR  62 bpm     Resting BP  112/60     Max Ex. HR  101 bpm     Max Ex. BP  168/68     2 Minute Post BP  116/70        Psychological, QOL, Others - Outcomes: PHQ 2/9: Depression screen [facility] 2/9 12/14/2017 08/31/2017  Decreased Interest 0 0  Down, Depressed, Hopeless 0 0  PHQ - 2 Score 0 0    Quality of Life: Quality of Life - 12/10/17 1639      Quality of Life Scores   Health/Function Pre  18.47 %    Health/Function Post  23.83 %    Health/Function % Change  29.02 %    Socioeconomic Pre  23.56 %    Socioeconomic Post  23.57 %    Socioeconomic % Change   0.04 %    Psych/Spiritual Pre  19.79 %    Psych/Spiritual Post  25.57 %    Psych/Spiritual % Change  29.21 %    Family Pre  21.8 %  Family Post  27.6 %    Family % Change  26.61 %    GLOBAL Pre  20.37 %    GLOBAL Post  24.69 %    GLOBAL % Change  21.21 %       Personal Goals: Goals established at orientation with interventions provided to work toward goal. Personal Goals and Risk Factors at Admission - 08/21/17 1609      Core Components/Risk Factors/Patient Goals on Admission    Weight Management  Yes;Weight Loss    Intervention  Weight Management: Provide education and appropriate resources to help participant work on and attain dietary goals.;Weight Management: Develop a combined nutrition and exercise program designed to reach desired caloric intake, while maintaining appropriate intake of nutrient and fiber, sodium and fats, and appropriate energy expenditure required for the weight goal.    Admit Weight  201 lb 15.1 oz (91.6 kg)    Goal Weight: Short Term  195 lb (88.5 kg)    Goal Weight: Long Term  190 lb (86.2 kg)    Expected Outcomes  Short Term: Continue to assess and modify interventions until short term weight is achieved;Weight Loss: Understanding of general recommendations for a balanced deficit meal plan, which promotes 1-2 lb weight loss per week and includes a negative  energy balance of [id number] kcal/d;Understanding recommendations for meals to include 15-35% energy as protein, 25-35% energy from fat, 35-60% energy from carbohydrates, less than 221m of dietary cholesterol, 20-35 gm of total fiber daily;Understanding of distribution of calorie intake throughout the day with the consumption of 4-5 meals/snacks;Long Term: Adherence to nutrition and physical activity/exercise program aimed toward attainment of established weight goal    Diabetes  Yes    Intervention  Provide education about proper nutrition, including hydration, and aerobic/resistive exercise prescription along with prescribed medications to achieve blood glucose in normal ranges: Fasting glucose 65-99 mg/dL;Provide education about signs/symptoms and action to take for hypo/hyperglycemia.    Expected Outcomes  Short Term: Participant verbalizes understanding of the signs/symptoms and immediate care of hyper/hypoglycemia, proper foot care and importance of medication, aerobic/resistive exercise and nutrition plan for blood glucose control.;Long Term: Attainment of HbA1C < 7%.    Lipids  Yes    Intervention  Provide education and support for participant on nutrition & aerobic/resistive exercise along with prescribed medications to achieve LDL <745m HDL >4052m   Expected Outcomes  Short Term: Participant states understanding of desired cholesterol values and is compliant with medications prescribed. Participant is following exercise prescription and nutrition guidelines.;Long Term: Cholesterol controlled with medications as prescribed, with individualized exercise RX and with personalized nutrition plan. Value goals: LDL < 65m19mDL > 40 mg.        Personal Goals Discharge: Goals and Risk Factor Review    Row Name 10/23/17 1645 11/21/17 0855 12/14/17 1214         Core Components/Risk Factors/Patient Goals Review   Personal Goals Review  Weight Management/Obesity;Lipids;Diabetes  Weight  Management/Obesity;Lipids;Diabetes  Weight Management/Obesity;Lipids;Diabetes     Review  Emannuel vital signs and CBG's have been stable at cardiac rehab.  Veryl vital signs and CBG's have been stable at cardiac rehab.  Gilberto vital signs and CBG's have been stable at cardiac rehab.     Expected Outcomes  CharValeriel continue to participate in phase 2 cardiac rehab and take his medicaitons as presribed.  CharKarminel continue to participate in phase 2 cardiac rehab and take his medicaitons as presribed.  CharFrancisol continue to take his  medications as [facility]        Exercise Goals and Review: Exercise Goals    Row Name 08/21/17 1351             Exercise Goals   Increase Physical Activity  Yes       Intervention  Provide advice, education, support and counseling about physical activity/exercise needs.;Develop an individualized exercise prescription for aerobic and resistive training based on initial evaluation findings, risk stratification, comorbidities and participant's personal goals.       Expected Outcomes  Achievement of increased cardiorespiratory fitness and enhanced flexibility, muscular endurance and strength shown through measurements of functional capacity and personal statement of participant.       Increase Strength and Stamina  Yes       Intervention  Provide advice, education, support and counseling about physical activity/exercise needs.;Develop an individualized exercise prescription for aerobic and resistive training based on initial evaluation findings, risk stratification, comorbidities and participant's personal goals.       Expected Outcomes  Achievement of increased cardiorespiratory fitness and enhanced flexibility, muscular endurance and strength shown through measurements of functional capacity and personal statement of participant.       Able to understand and use rate of perceived exertion (RPE) scale   Yes       Intervention  Provide education and explanation on how to use RPE scale       Expected Outcomes  Short Term: Able to use RPE daily in rehab to express subjective intensity level;Long Term:  Able to use RPE to guide intensity level when exercising independently       Knowledge and understanding of Target Heart Rate Range (THRR)  Yes       Intervention  Provide education and explanation of THRR including how the numbers were predicted and where they are located for reference       Expected Outcomes  Short Term: Able to state/look up THRR;Long Term: Able to use THRR to govern intensity when exercising independently;Short Term: Able to use daily as guideline for intensity in rehab       Able to check pulse independently  Yes       Intervention  Provide education and demonstration on how to check pulse in carotid and radial arteries.;Review the importance of being able to check your own pulse for safety during independent exercise       Expected Outcomes  Short Term: Able to explain why pulse checking is important during independent exercise;Long Term: Able to check pulse independently and accurately       Understanding of Exercise Prescription  Yes       Intervention  Provide education, explanation, and written materials on patient's individual exercise prescription       Expected Outcomes  Short Term: Able to explain program exercise prescription;Long Term: Able to explain home exercise prescription to exercise independently          Nutrition & Weight - Outcomes: Pre Biometrics - 08/21/17 1631      Pre Biometrics   Height  5' 9.75" (1.772 m)    Weight  201 lb 15.1 oz (91.6 kg)    Waist Circumference  41 inches    Hip Circumference  41.75 inches    Waist to Hip Ratio  0.98 %    BMI (Calculated)  29.17    Triceps Skinfold  17 mm    % Body Fat  29 %  Grip Strength  39 kg    Flexibility  13 in    Single Leg Stand  24.03 seconds      Post Biometrics - 12/05/17 1533       Post   Biometrics   Height  5' 9.75" (1.772 m)    Weight  199 lb 8.3 oz (90.5 kg)    Waist Circumference  40.5 inches    Hip Circumference  41.5 inches    Waist to Hip Ratio  0.98 %    BMI (Calculated)  28.82    Triceps Skinfold  17 mm    % Body Fat  28.6 %    Grip Strength  45 kg    Flexibility  10 in    Single Leg Stand  30 seconds       Nutrition: Nutrition Therapy & Goals - 08/21/17 1509      Nutrition Therapy   Diet  Carb Modified, Heart Healthy      Personal Nutrition Goals   Nutrition Goal  Pt to identify food quantities necessary to achieve weight loss of 6-24 lb at graduation from cardiac rehab. Goal wt of 185 lb desired.     Personal Goal #2  Pt to identify and limit food sources of saturated fat, trans fat, and sodium    Personal Goal #3  CBG concentrations in the normal range or as close to normal as is safely possible.      Intervention Plan   Intervention  Prescribe, educate and counsel regarding individualized specific dietary modifications aiming towards targeted core components such as weight, hypertension, lipid management, diabetes, heart failure and other comorbidities.    Expected Outcomes  Short Term Goal: Understand basic principles of dietary content, such as calories, fat, sodium, cholesterol and nutrients.;Long Term Goal: Adherence to prescribed nutrition plan.       Nutrition Discharge: Nutrition Assessments - 12/21/17 0920      MEDFICTS Scores   Pre Score  83    Post Score  19    Score Difference  -64       Education Questionnaire Score: Knowledge Questionnaire Score - 12/17/17 1533      Knowledge Questionnaire Score   Pre Score  22/24    Post Score  24/24       Goals reviewed with patient; copy given to patient. Chales graduated from cardiac rehab program today with completion of 29 exercise sessions in Phase II. Pt maintained good attendance and progressed nicely during his participation in rehab as evidenced by increased MET level.    Medication list reconciled. Repeat  PHQ score- 0 .  Pt has made significant lifestyle changes and should be commended for his success. Pt feels he has achieved his goals during cardiac rehab.   [facility] increased his distance on his post exercise walk test and lost 2 lbs. Barnet Pall, RN,BSN 12/26/2017 9:27 AM

## 2018-02-26 DIAGNOSIS — M1711 Unilateral primary osteoarthritis, right knee: Secondary | ICD-10-CM | POA: Insufficient documentation

## 2018-02-26 DIAGNOSIS — Z87442 Personal history of urinary calculi: Secondary | ICD-10-CM | POA: Insufficient documentation

## 2018-02-26 DIAGNOSIS — I2510 Atherosclerotic heart disease of native coronary artery without angina pectoris: Secondary | ICD-10-CM | POA: Insufficient documentation

## 2018-08-21 LAB — CBC AND DIFFERENTIAL
HCT: 40 — AB (ref 41–53)
Hemoglobin: 14.2 (ref 13.5–17.5)
Platelets: 229 (ref 150–399)
WBC: 6.8

## 2018-08-21 LAB — HEPATIC FUNCTION PANEL
ALT: 15 (ref 10–40)
AST: 15 (ref 14–40)
Alkaline Phosphatase: 61 (ref 25–125)
Bilirubin, Total: 0.5

## 2018-08-21 LAB — LIPID PANEL
Cholesterol: 99 (ref 0–200)
HDL: 46 (ref 35–70)
LDL Cholesterol: 12
Triglycerides: 205 — AB (ref 40–160)

## 2018-08-21 LAB — BASIC METABOLIC PANEL
BUN: 22 — AB (ref 4–21)
CREATININE: 1.4 — AB (ref 0.6–1.3)
Glucose: 117
POTASSIUM: 5 (ref 3.4–5.3)
Sodium: 140 (ref 137–147)

## 2018-08-21 LAB — TSH: TSH: 2.05 (ref 0.41–5.90)

## 2018-08-21 LAB — HEMOGLOBIN A1C: Hemoglobin A1C: 5.3

## 2018-08-21 LAB — HM HEPATITIS C SCREENING LAB: HM Hepatitis Screen: NEGATIVE

## 2018-10-09 ENCOUNTER — Ambulatory Visit (INDEPENDENT_AMBULATORY_CARE_PROVIDER_SITE_OTHER): Payer: PPO | Admitting: Physician Assistant

## 2018-10-09 ENCOUNTER — Encounter: Payer: Self-pay | Admitting: Physician Assistant

## 2018-10-09 VITALS — BP 124/70 | HR 56 | Temp 97.6°F | Ht 69.5 in | Wt 196.4 lb

## 2018-10-09 DIAGNOSIS — L820 Inflamed seborrheic keratosis: Secondary | ICD-10-CM | POA: Diagnosis not present

## 2018-10-09 DIAGNOSIS — L989 Disorder of the skin and subcutaneous tissue, unspecified: Secondary | ICD-10-CM

## 2018-10-09 DIAGNOSIS — E119 Type 2 diabetes mellitus without complications: Secondary | ICD-10-CM

## 2018-10-09 DIAGNOSIS — Z7689 Persons encountering health services in other specified circumstances: Secondary | ICD-10-CM

## 2018-10-09 NOTE — Patient Instructions (Signed)
You should consider being screened if you have all three of these risk factors:   You are 44-72 years old.  You are a current or former smoker who quit less than 15 years ago.   You smoked at least 30 pack-years (this means 1 pack per day for 30 years or 2 packs per day for 15 years, etc.)  If you have all three of theses risk factors, most experts recommend screening by a a low dose CT scan.  [email or web address]

## 2018-10-09 NOTE — Progress Notes (Signed)
Nicholas Maxwell is a 73 y.o. male here to Establish Care.  I acted as a Education administrator for [employer], PA-C Anselmo Pickler, LPN  History of Present Illness:   Chief Complaint  Patient presents with  . Establish Care  . Skin tag on back   Acute Concerns: DM 2 -- currently well controlled. Metformin currently. Tolerating well. Was briefly on Jardiance, per recommendation by cardiologist. Last HgbA1c 5.3 on 08/21/18. Skin tag -- a few weeks ago, his skin tag caught on something, almost ripped off. Bled for a bit. It is not tender but he would like it checked.  Health Maintenance: Immunizations -- UTD Colonoscopy -- done 12/15/2016, normal per pt. Will obtain records. PSA -- UTD, Dr. Roni [facility] last seen 12/18/2017. F/U one year. PSA normal per pt. Weight -- Weight: 196 lb 6.1 oz (89.1 kg)    Depression screen [facility] 2/9 12/14/2017  Decreased Interest 0  Down, Depressed, Hopeless 0  PHQ - 2 Score 0    No flowsheet data found.   Other providers/specialists: Patient Care Team: [name], [place] as PCP - General (Physician Assistant) [name], MD as Attending Physician (Urology) Thalia Bloodgood, OD as Referring Physician (Optometry) Patwardhan, Reynold Bowen, MD as Consulting Physician (Cardiology)   Past Medical History:  Diagnosis Date  . Abnormal stress test 05/18/2017  . Arthritis    "right knee" (05/24/2017)  . Chest pain   . Coronary artery disease   . Enlarged prostate    followed by Dr. Jeffie Pollock  . Gout    R big toe usually  . High cholesterol   . History of kidney stones   . Hypertension   . Myocardial infarction ([place]) 04/2017  . Pneumonia 1990s X 1  . Statin intolerance    Used >2 different statins  . Type II diabetes mellitus ([place])      Social History   Socioeconomic History  . Marital status: Married    Spouse name: Not on file  . Number of children: Not on file  . Years of education: Not on file  . Highest education level: Not on file   Occupational History  . Not on file  Social Needs  . Financial resource strain: Not on file  . Food insecurity:    Worry: Not on file    Inability: Not on file  . Transportation needs:    Medical: Not on file    Non-medical: Not on file  Tobacco Use  . Smoking status: Former Smoker    Packs/day: 1.00    Years: 30.00    Pack years: 30.00    Types: Cigarettes    Last attempt to quit: 2003    Years since quitting: 17.0  . Smokeless tobacco: Never Used  Substance and Sexual Activity  . Alcohol use: No  . Drug use: No  . Sexual activity: Not Currently  Lifestyle  . Physical activity:    Days per week: Not on file    Minutes per session: Not on file  . Stress: Not on file  Relationships  . Social connections:    Talks on phone: Not on file    Gets together: Not on file    Attends religious service: Not on file    Active member of club or organization: Not on file    Attends meetings of clubs or organizations: Not on file    Relationship status: Not on file  . Intimate partner violence:    Fear of current or ex partner:  Not on file    Emotionally abused: Not on file    Physically abused: Not on file    Forced sexual activity: Not on file  Other Topics Concern  . Not on file  Social History Narrative   Married   Son and daughter   Lynnette Caffey   Worked in Research officer, trade union    Past Surgical History:  Procedure Laterality Date  . CORONARY ANGIOPLASTY WITH STENT PLACEMENT  05/24/2017  . CORONARY STENT INTERVENTION N/A 05/24/2017   Procedure: CORONARY STENT INTERVENTION;  Surgeon: Nigel Mormon, MD;  Location: [facility];  Service: Cardiovascular;  Laterality: N/A;  MID Circumflex 4.0x22 Onyx MID LAD 2.75x15 Onyx  . CYSTOSCOPY WITH URETEROSCOPY, STONE BASKETRY AND STENT PLACEMENT  ~ 1990  . HERNIA REPAIR    . INTRAVASCULAR PRESSURE WIRE/FFR STUDY N/A 05/24/2017   Procedure: INTRAVASCULAR PRESSURE WIRE/FFR STUDY;  Surgeon: Nigel Mormon, MD;  Location:  [facility];  Service: Cardiovascular;  Laterality: N/A;  MID LAD  . KNEE ARTHROSCOPY Right 2016  . LEFT HEART CATH AND CORONARY ANGIOGRAPHY N/A 05/24/2017   Procedure: LEFT HEART CATH AND CORONARY ANGIOGRAPHY;  Surgeon: Nigel Mormon, MD;  Location: [place] [facility];  Service: Cardiovascular;  Laterality: N/A;  . UMBILICAL HERNIA REPAIR  1992    Family History  Problem Relation Age of Onset  . Transient ischemic attack Father 79  . Bone cancer Father   . CAD Mother 59       CABG    Allergies  Allergen Reactions  . Crestor [Rosuvastatin Calcium] Anaphylaxis and Swelling    Tongue swells/throat closes  . Statins Other (See Comments)    Muscle/joint pain with ALL other statin drug but Crestor is severe     Current Medications:   Current Outpatient Medications:  .  ACCU-CHEK SOFTCLIX LANCETS lancets, USE TO CHECK BLOOD SUGAR TWICE DAILY, Disp: , Rfl:  .  alfuzosin (UROXATRAL) 10 MG 24 hr tablet, Take 10 mg by mouth daily with supper., Disp: , Rfl:  .  allopurinol (ZYLOPRIM) 300 MG tablet, Take 300 mg by mouth daily with lunch., Disp: , Rfl:  .  aspirin EC 81 MG tablet, Take 81 mg by mouth daily with breakfast., Disp: , Rfl:  .  azelastine (ASTELIN) 0.1 % nasal spray, 2 sprays each nostril four times daily for 3-5 days then twice a day, Disp: , Rfl:  .  Glucosamine-Chondroitin 750-600 MG TABS, Take by mouth., Disp: , Rfl:  .  glucose blood (ACCU-CHEK AVIVA) test strip, Use to check blood sugar twice daily, Disp: , Rfl:  .  metFORMIN (GLUCOPHAGE-XR) 500 MG 24 hr tablet, Take 1,000 mg by mouth daily with supper., Disp: , Rfl:  .  metoprolol succinate (TOPROL-XL) 25 MG 24 hr tablet, Take 25 mg by mouth daily with breakfast., Disp: , Rfl:  .  nitroGLYCERIN (NITROSTAT) 0.4 MG SL tablet, Place 0.4 mg under the tongue every 5 (five) minutes x 3 doses as needed. For chest pain., Disp: , Rfl:  .  ramipril (ALTACE) 5 MG capsule, Take by mouth., Disp: , Rfl:  .  ranolazine  (RANEXA) 500 MG 12 hr tablet, Take 500 mg by mouth 2 (two) times daily., Disp: , Rfl:  .  REPATHA SURECLICK 810 MG/ML SOAJ, Inject 140 mg into the skin every 14 (fourteen) days., Disp: , Rfl:    Review of Systems:   ROS  Negative unless otherwise specified per HPI.  Vitals:   Vitals:   10/09/18 1751  BP: 124/70  Pulse: (!) 56  Temp: 97.6 F (36.4 C)  TempSrc: Oral  SpO2: 98%  Weight: 196 lb 6.1 oz (89.1 kg)  Height: 5' 9.5" (1.765 m)      Body mass index is 28.58 kg/m.  Physical Exam:   Physical Exam Vitals signs and nursing note reviewed.  Constitutional:      General: He is not in acute distress.    Appearance: He is well-developed. He is not ill-appearing or toxic-appearing.  Cardiovascular:     Rate and Rhythm: Normal rate and regular rhythm.     Pulses: Normal pulses.     Heart sounds: Normal heart sounds, S1 normal and S2 normal.     Comments: No LE edema Pulmonary:     Effort: Pulmonary effort is normal.     Breath sounds: Normal breath sounds.  Skin:    General: Skin is warm and dry.     Comments: approx 1/2 cm area of pedunculated skin to L mid-back, dark brown in color  Neurological:     Mental Status: He is alert.     GCS: GCS eye subscore is 4. GCS verbal subscore is 5. GCS motor subscore is 6.  Psychiatric:        Speech: Speech normal.        Behavior: Behavior normal. Behavior is cooperative.    Skin tag lesion removal This procedure has been fully reviewed with the patient and written informed consent has been obtained. Area was cleaned with betadine. Approx 1/2 cc of 2% lidocaine injected into base of skin tag. Lesion was then shaved, removed with forceps and collected into formalin solution. Approx loss of bleeding is less than 1cc. Patient tolerated procedure well. Silver nitrate applied to area. Topical bacitracin applied to area and bandage placed.   Assessment and Plan:   Kierre was seen today for establish care and skin tag on  back.  Diagnoses and all orders for this visit:  Encounter to establish care We reviewed his medical issues at length today.  Skin lesion Skin tag removed without problems. Will send to derm path. -     Dermatology pathology()  Type 2 diabetes mellitus without complication, without long-term current use of insulin ([place]) Too soon for repeat HgbA1c. Discussed current regimen. Will re-check in 2 months. Consider reducing Metformin if appropriate.  . Reviewed expectations re: course of current medical issues. . Discussed self-management of symptoms. . Outlined signs and symptoms indicating need for more acute intervention. . Patient verbalized understanding and all questions were answered. . See orders for this visit as documented in the electronic medical record. . Patient received an After-Visit Summary.  CMA or LPN served as scribe during this visit. History, Physical, and Plan performed by medical provider. The above documentation has been reviewed and is accurate and complete.  [name], PA-C

## 2018-10-11 ENCOUNTER — Encounter: Payer: Self-pay | Admitting: Physician Assistant

## 2018-10-11 LAB — ESTIMATED GFR: EGFR (Non-African Amer.): 49

## 2018-10-30 ENCOUNTER — Encounter: Payer: Self-pay | Admitting: Physician Assistant

## 2018-11-18 ENCOUNTER — Other Ambulatory Visit: Payer: Self-pay

## 2018-11-18 MED ORDER — METOPROLOL SUCCINATE ER 25 MG PO TB24: 25.0000 mg | ORAL_TABLET | Freq: Every day | ORAL | 0 refills | Status: DC

## 2018-11-20 ENCOUNTER — Encounter: Payer: Self-pay | Admitting: Physician Assistant

## 2018-11-20 ENCOUNTER — Other Ambulatory Visit (INDEPENDENT_AMBULATORY_CARE_PROVIDER_SITE_OTHER): Payer: PPO

## 2018-11-20 ENCOUNTER — Ambulatory Visit (INDEPENDENT_AMBULATORY_CARE_PROVIDER_SITE_OTHER): Payer: PPO | Admitting: Physician Assistant

## 2018-11-20 VITALS — BP 110/66 | HR 60 | Temp 97.6°F | Ht 69.5 in | Wt 199.0 lb

## 2018-11-20 DIAGNOSIS — E119 Type 2 diabetes mellitus without complications: Secondary | ICD-10-CM

## 2018-11-20 LAB — HEMOGLOBIN A1C: Hgb A1c MFr Bld: 5.7 % (ref 4.6–6.5)

## 2018-11-20 LAB — BASIC METABOLIC PANEL
BUN: 30 mg/dL — AB (ref 6–23)
CO2: 27 mEq/L (ref 19–32)
Calcium: 9.4 mg/dL (ref 8.4–10.5)
Chloride: 103 mEq/L (ref 96–112)
Creatinine, Ser: 1.49 mg/dL (ref 0.40–1.50)
GFR: 46.39 mL/min — ABNORMAL LOW (ref >60.00)
GLUCOSE: 112 mg/dL — AB (ref 70–99)
POTASSIUM: 4.9 meq/L (ref 3.5–5.1)
SODIUM: 138 meq/L (ref 135–145)

## 2018-11-20 NOTE — Patient Instructions (Signed)
It was great to see you!  We will be in touch with your lab results.  Let's follow-up after 02/18/19, sooner if you have concerns.  Take care,  [name] PA-C

## 2018-11-20 NOTE — Assessment & Plan Note (Signed)
Repeat A1c pending. Will adjust metformin as needed. Was on Januvia on the past. Will make recommendations based on results.

## 2018-11-20 NOTE — Addendum Note (Signed)
Addended by: Francis Dowse T on: 11/20/2018 10:12 AM   Modules accepted: Orders

## 2018-11-20 NOTE — Progress Notes (Signed)
_  Nicholas Maxwell is a 73 y.o. male is here to follow up on Diabetes.  I acted as a Education administrator for [employer], PA-C Anselmo Pickler, LPN  History of Present Illness:   Chief Complaint  Patient presents with  . Diabetes    Diabetes  He presents for his follow-up diabetic visit. He has type 2 diabetes mellitus. No MedicAlert identification noted. His disease course has been stable. There are no hypoglycemic associated symptoms. Pertinent negatives for hypoglycemia include no dizziness or headaches. There are no diabetic associated symptoms. Pertinent negatives for diabetes include no chest pain and no weight loss. There are no hypoglycemic complications. Symptoms are stable. Diabetic complications include heart disease. Risk factors for coronary artery disease include diabetes mellitus and dyslipidemia. Current diabetic treatment includes oral agent (monotherapy). He is compliant with treatment all of the time. His weight is stable. He is following a low fat/cholesterol and generally healthy diet. Meal planning includes avoidance of concentrated sweets (Avoid salt). He has had a previous visit with a dietitian (year ago when he was in Cardiac Rehab). [facility]. Home blood sugar record trend: Pt checking blood sugars couple times a month. Since here last blood sugars ranging from 105-108 morning and evening. ACE inhibitor/angiotensin II receptor blocker: Pt is on Repatha injection. He does not see a podiatrist.Eye exam is current.   Wt Readings from Last 5 Encounters:  11/20/18 199 lb (90.3 kg)  08/22/18 195 lb (88.5 kg)  10/09/18 196 lb 6.1 oz (89.1 kg)  12/05/17 199 lb 8.3 oz (90.5 kg)  08/21/17 201 lb 15.1 oz (91.6 kg)     Health Maintenance Due  Topic Date Due  . FOOT EXAM  02/20/1956    Past Medical History:  Diagnosis Date  . Abnormal stress test 05/18/2017  . Arthritis    "right knee" (05/24/2017)  . Chest pain   . Coronary  artery disease   . Enlarged prostate    followed by Dr. Jeffie Pollock  . Gout    R big toe usually  . High cholesterol   . History of kidney stones   . Hypertension   . Myocardial infarction ([place]) 04/2017  . Pneumonia 1990s X 1  . Statin intolerance    Used >2 different statins  . Type II diabetes mellitus ([place])      Social History   Socioeconomic History  . Marital status: Married    Spouse name: Not on file  . Number of children: Not on file  . Years of education: Not on file  . Highest education level: Not on file  Occupational History  . Not on file  Social Needs  . Financial resource strain: Not on file  . Food insecurity:    Worry: Not on file    Inability: Not on file  . Transportation needs:    Medical: Not on file    Non-medical: Not on file  Tobacco Use  . Smoking status: Former Smoker    Packs/day: 1.00    Years: 30.00    Pack years: 30.00    Types: Cigarettes    Last attempt to quit: 2003    Years since quitting: 17.1  . Smokeless tobacco: Never Used  Substance and Sexual Activity  . Alcohol use: No  . Drug use: No  . Sexual activity: Not Currently  Lifestyle  . Physical activity:    Days per week: Not on file    Minutes per  session: Not on file  . Stress: Not on file  Relationships  . Social connections:    Talks on phone: Not on file    Gets together: Not on file    Attends religious service: Not on file    Active member of club or organization: Not on file    Attends meetings of clubs or organizations: Not on file    Relationship status: Not on file  . Intimate partner violence:    Fear of current or ex partner: Not on file    Emotionally abused: Not on file    Physically abused: Not on file    Forced sexual activity: Not on file  Other Topics Concern  . Not on file  Social History Narrative   Married   Son and daughter   Lynnette Caffey   Worked in Research officer, trade union    Past Surgical History:  Procedure Laterality Date  . CORONARY ANGIOPLASTY  WITH STENT PLACEMENT  05/24/2017  . CORONARY STENT INTERVENTION N/A 05/24/2017   Procedure: CORONARY STENT INTERVENTION;  Surgeon: Nigel Mormon, MD;  Location: [place] [facility];  Service: Cardiovascular;  Laterality: N/A;  MID Circumflex 4.0x22 Onyx MID LAD 2.75x15 Onyx  . CYSTOSCOPY WITH URETEROSCOPY, STONE BASKETRY AND STENT PLACEMENT  ~ 1990  . HERNIA REPAIR    . INTRAVASCULAR PRESSURE WIRE/FFR STUDY N/A 05/24/2017   Procedure: INTRAVASCULAR PRESSURE WIRE/FFR STUDY;  Surgeon: Nigel Mormon, MD;  Location: [place] [facility];  Service: Cardiovascular;  Laterality: N/A;  MID LAD  . KNEE ARTHROSCOPY Right 2016  . LEFT HEART CATH AND CORONARY ANGIOGRAPHY N/A 05/24/2017   Procedure: LEFT HEART CATH AND CORONARY ANGIOGRAPHY;  Surgeon: Nigel Mormon, MD;  Location: [place] [facility];  Service: Cardiovascular;  Laterality: N/A;  . UMBILICAL HERNIA REPAIR  1992    Family History  Problem Relation Age of Onset  . Transient ischemic attack Father 53  . Bone cancer Father   . CAD Mother 40       CABG    PMHx, SurgHx, SocialHx, FamHx, Medications, and Allergies were reviewed in the Visit Navigator and updated as appropriate.   Patient Active Problem List   Diagnosis Date Noted  . Coronary artery disease with stable angina pectoris ([place]) 02/26/2018  . History of kidney stones 02/26/2018  . Osteoarthritis of right knee 02/26/2018  . Angina pectoris ([place]) 05/24/2017  . Post PTCA 05/24/2017  . Polyp of colon, adenomatous 12/08/2016  . Arthritis of carpometacarpal [facility]) joint of left thumb 11/26/2016  . Essential hypertension 05/12/2015  . 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 Hypertrophic and atrophic condition of skin 05/12/2015  . Hyperlipidemia 05/12/2015  . Other seborrheic keratosis 05/12/2015  . Type 2 diabetes mellitus without complication, without long-term current use of insulin ([place]) 05/12/2015  . Hypertrophy of prostate without urinary obstruction and other lower  urinary tract symptoms (LUTS) 04/08/2013    Social History   Tobacco Use  . Smoking status: Former Smoker    Packs/day: 1.00    Years: 30.00    Pack years: 30.00    Types: Cigarettes    Last attempt to quit: 2003    Years since quitting: 17.1  . Smokeless tobacco: Never Used  Substance Use Topics  . Alcohol use: No  . Drug use: No    Current Medications and Allergies:    Current Outpatient Medications:  .  ACCU-CHEK SOFTCLIX LANCETS lancets, USE TO CHECK BLOOD SUGAR TWICE DAILY, Disp: , Rfl:  .  alfuzosin (UROXATRAL) 10  MG 24 hr tablet, Take 10 mg by mouth daily with supper., Disp: , Rfl:  .  allopurinol (ZYLOPRIM) 300 MG tablet, Take 300 mg by mouth daily with lunch., Disp: , Rfl:  .  aspirin EC 81 MG tablet, Take 81 mg by mouth daily with breakfast., Disp: , Rfl:  .  azelastine (ASTELIN) 0.1 % nasal spray, 2 sprays each nostril four times daily for 3-5 days then twice a day, Disp: , Rfl:  .  Glucosamine-Chondroitin 750-600 MG TABS, Take by mouth., Disp: , Rfl:  .  glucose blood (ACCU-CHEK AVIVA) test strip, Use to check blood sugar twice daily, Disp: , Rfl:  .  metFORMIN (GLUCOPHAGE-XR) 500 MG 24 hr tablet, Take 1,000 mg by mouth daily with supper., Disp: , Rfl:  .  metoprolol succinate (TOPROL-XL) 25 MG 24 hr tablet, Take 1 tablet (25 mg total) by mouth daily with breakfast., Disp: 90 tablet, Rfl: 0 .  nitroGLYCERIN (NITROSTAT) 0.4 MG SL tablet, Place 0.4 mg under the tongue every 5 (five) minutes x 3 doses as needed. For chest pain., Disp: , Rfl:  .  ramipril (ALTACE) 5 MG capsule, Take by mouth., Disp: , Rfl:  .  ranolazine (RANEXA) 500 MG 12 hr tablet, Take 500 mg by mouth 2 (two) times daily., Disp: , Rfl:  .  REPATHA SURECLICK 811 MG/ML SOAJ, Inject 140 mg into the skin every 14 (fourteen) days., Disp: , Rfl:    Allergies  Allergen Reactions  . Crestor [Rosuvastatin Calcium] Anaphylaxis and Swelling    Tongue swells/throat closes  . Statins Other (See Comments)     Muscle/joint pain with ALL other statin drug but Crestor is severe    Review of Systems   Review of Systems  Constitutional: Negative for chills, fever, malaise/fatigue and weight loss.  Respiratory: Negative for shortness of breath.   Cardiovascular: Negative for chest pain, orthopnea, claudication and leg swelling.  Gastrointestinal: Negative for heartburn, nausea and vomiting.  Neurological: Negative for dizziness, tingling and headaches.    Vitals:   Vitals:   11/20/18 1331  BP: 110/66  Pulse: 60  Temp: 97.6 F (36.4 C)  TempSrc: Oral  SpO2: 97%  Weight: 199 lb (90.3 kg)  Height: 5' 9.5" (1.765 m)     Body mass index is 28.97 kg/m.   Physical Exam:    Physical Exam Vitals signs and nursing note reviewed.  Constitutional:      General: He is not in acute distress.    Appearance: He is well-developed. He is not ill-appearing or toxic-appearing.  Cardiovascular:     Rate and Rhythm: Normal rate and regular rhythm.     Pulses: Normal pulses.     Heart sounds: Normal heart sounds, S1 normal and S2 normal.     Comments: No LE edema Pulmonary:     Effort: Pulmonary effort is normal.     Breath sounds: Normal breath sounds.  Skin:    General: Skin is warm and dry.  Neurological:     Mental Status: He is alert.     GCS: GCS eye subscore is 4. GCS verbal subscore is 5. GCS motor subscore is 6.  Psychiatric:        Speech: Speech normal.        Behavior: Behavior normal. Behavior is cooperative.      Diabetic Foot Exam - Simple   Simple Foot Form Diabetic Foot exam was performed with the following findings:  Yes 11/20/2018  1:45 PM  Visual Inspection No deformities,  no ulcerations, no other skin breakdown bilaterally:  Yes Sensation Testing Intact to touch and monofilament testing bilaterally:  Yes Pulse Check Posterior Tibialis and Dorsalis pulse intact bilaterally:  Yes Comments       Assessment and Plan:    Problem List Items Addressed This Visit       Endocrine   Type 2 diabetes mellitus without complication, without long-term current use of insulin ([place]) - Primary    Repeat A1c pending. Will adjust metformin as needed. Was on Januvia on the past. Will make recommendations based on results.          CMA or LPN served as scribe during this visit. History, Physical, and Plan performed by medical provider. The above documentation has been reviewed and is accurate and complete.  [name], PA-C [place], [place] 11/20/2018  Follow-up: No follow-ups on file.

## 2018-11-21 ENCOUNTER — Encounter: Payer: Self-pay | Admitting: Cardiology

## 2018-11-21 ENCOUNTER — Ambulatory Visit (INDEPENDENT_AMBULATORY_CARE_PROVIDER_SITE_OTHER): Payer: PPO | Admitting: Cardiology

## 2018-11-21 VITALS — BP 118/60 | HR 60 | Ht 71.0 in | Wt 200.1 lb

## 2018-11-21 DIAGNOSIS — I10 Essential (primary) hypertension: Secondary | ICD-10-CM | POA: Diagnosis not present

## 2018-11-21 DIAGNOSIS — E119 Type 2 diabetes mellitus without complications: Secondary | ICD-10-CM | POA: Diagnosis not present

## 2018-11-21 DIAGNOSIS — R0609 Other forms of dyspnea: Secondary | ICD-10-CM | POA: Diagnosis not present

## 2018-11-21 DIAGNOSIS — I2510 Atherosclerotic heart disease of native coronary artery without angina pectoris: Secondary | ICD-10-CM | POA: Diagnosis not present

## 2018-11-21 DIAGNOSIS — Z789 Other specified health status: Secondary | ICD-10-CM | POA: Diagnosis not present

## 2018-11-21 DIAGNOSIS — E782 Mixed hyperlipidemia: Secondary | ICD-10-CM | POA: Diagnosis not present

## 2018-11-21 DIAGNOSIS — Z136 Encounter for screening for cardiovascular disorders: Secondary | ICD-10-CM

## 2018-11-21 MED ORDER — RANOLAZINE ER 500 MG PO TB12: 1000.0000 mg | ORAL_TABLET | Freq: Two times a day (BID) | ORAL | 3 refills | Status: DC

## 2018-11-21 NOTE — Progress Notes (Signed)
Patient is here for follow up visit.  Subjective:   Nicholas Maxwell, male    DOB: 1945/10/23, 73 y.o.   MRN: [id number]  Chief Complaint  Patient presents with  . Coronary Artery Disease    HPI   73 year old pleasant Caucasian male with CAD s/p two vessel succefful PCI to mLCx and mLAD, controlled hypertension, type 2 DM, CKD 3, hyperlipidemia with statin intolerance, on PCSK9 inhibitor.  At last visit in 08/2018, I stopped his plavix after completion of 1 year of DAPT. Given A1C of 5.3%, I had stopped Jardiacne, iven concern for causing hypoglycemia.   Patient is here for 3 month follow up. He is doing well without any chest pain. He is doing his regular water aerobic activity without any difficulty. He has occasional mild shortness of breath, which is unchanged from prior.    Past Medical History:  Diagnosis Date  . Abnormal stress test 05/18/2017  . Arthritis    "right knee" (05/24/2017)  . Chest pain   . Coronary artery disease   . Dyspnea on exertion 11/21/2018  . Enlarged prostate    followed by Dr. Jeffie Pollock  . Gout    R big toe usually  . High cholesterol   . History of kidney stones   . Hypertension   . Myocardial infarction ([place]) 04/2017  . Pneumonia 1990s X 1  . Screening for AAA (abdominal aortic aneurysm) 11/21/2018  . Statin intolerance    Used >2 different statins  . Type II diabetes mellitus ([place])     Past Surgical History:  Procedure Laterality Date  . CORONARY ANGIOPLASTY WITH STENT PLACEMENT  05/24/2017  . CORONARY STENT INTERVENTION N/A 05/24/2017   Procedure: CORONARY STENT INTERVENTION;  Surgeon: Nigel Mormon, MD;  Location: [place] [facility];  Service: Cardiovascular;  Laterality: N/A;  MID Circumflex 4.0x22 Onyx MID LAD 2.75x15 Onyx  . CYSTOSCOPY WITH URETEROSCOPY, STONE BASKETRY AND STENT PLACEMENT  ~ 1990  . HERNIA REPAIR    . INTRAVASCULAR PRESSURE WIRE/FFR STUDY N/A 05/24/2017   Procedure: INTRAVASCULAR PRESSURE WIRE/FFR STUDY;   Surgeon: Nigel Mormon, MD;  Location: [place] [facility];  Service: Cardiovascular;  Laterality: N/A;  MID LAD  . KNEE ARTHROSCOPY Right 2016  . LEFT HEART CATH AND CORONARY ANGIOGRAPHY N/A 05/24/2017   Procedure: LEFT HEART CATH AND CORONARY ANGIOGRAPHY;  Surgeon: Nigel Mormon, MD;  Location: [place] [facility];  Service: Cardiovascular;  Laterality: N/A;  . UMBILICAL HERNIA REPAIR  1992    Social History   Socioeconomic History  . Marital status: Married    Spouse name: Not on file  . Number of children: Not on file  . Years of education: Not on file  . Highest education level: Not on file  Occupational History  . Not on file  Social Needs  . Financial resource strain: Not on file  . Food insecurity:    Worry: Not on file    Inability: Not on file  . Transportation needs:    Medical: Not on file    Non-medical: Not on file  Tobacco Use  . Smoking status: Former Smoker    Packs/day: 1.00    Years: 30.00    Pack years: 30.00    Types: Cigarettes    Last attempt to quit: 2003    Years since quitting: 17.1  . Smokeless tobacco: Never Used  Substance and Sexual Activity  . Alcohol use: No  . Drug use: No  . Sexual activity:  Not Currently  Lifestyle  . Physical activity:    Days per week: Not on file    Minutes per session: Not on file  . Stress: Not on file  Relationships  . Social connections:    Talks on phone: Not on file    Gets together: Not on file    Attends religious service: Not on file    Active member of club or organization: Not on file    Attends meetings of clubs or organizations: Not on file    Relationship status: Not on file  . Intimate partner violence:    Fear of current or ex partner: Not on file    Emotionally abused: Not on file    Physically abused: Not on file    Forced sexual activity: Not on file  Other Topics Concern  . Not on file  Social History Narrative   Married   Son and daughter   Lynnette Caffey   Worked in  Research officer, trade union    Current Outpatient Medications on File Prior to Visit  Medication Sig Dispense Refill  . ACCU-CHEK SOFTCLIX LANCETS lancets USE TO CHECK BLOOD SUGAR TWICE DAILY    . alfuzosin (UROXATRAL) 10 MG 24 hr tablet Take 10 mg by mouth daily with supper.    Marland Kitchen allopurinol (ZYLOPRIM) 300 MG tablet Take 300 mg by mouth daily with lunch.    Marland Kitchen aspirin EC 81 MG tablet Take 81 mg by mouth daily with breakfast.    . azelastine (ASTELIN) 0.1 % nasal spray 2 sprays each nostril four times daily for 3-5 days then twice a day    . Glucosamine-Chondroitin 750-600 MG TABS Take by mouth.    Marland Kitchen glucose blood (ACCU-CHEK AVIVA) test strip Use to check blood sugar twice daily    . metFORMIN (GLUCOPHAGE-XR) 500 MG 24 hr tablet Take 1,000 mg by mouth daily with supper.    . metoprolol succinate (TOPROL-XL) 25 MG 24 hr tablet Take 1 tablet (25 mg total) by mouth daily with breakfast. 90 tablet 0  . nitroGLYCERIN (NITROSTAT) 0.4 MG SL tablet Place 0.4 mg under the tongue every 5 (five) minutes x 3 doses as needed. For chest pain.    . ramipril (ALTACE) 5 MG capsule Take by mouth.    . ranolazine (RANEXA) 500 MG 12 hr tablet Take 500 mg by mouth 2 (two) times daily.    Marland Kitchen REPATHA SURECLICK 701 MG/ML SOAJ Inject 140 mg into the skin every 14 (fourteen) days.     No current facility-administered medications on file prior to visit.     Cardiovascular studies:  EKG 11/21/2018: Sinus rhythm 58 bpm. Normal axis. Normal conduction. Normal EKG.   Abdominal aortic duplex 09/11/2018: The maximum aorta diameter is 2.53 cm (mid). Diffuse heterogeneous plaque observed in the proximal, mid and distal aorta.  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  Normal flow velocities noted in aorta and iliac arteries. Rec: Rescan in 5 years if clinically indicated.  Cath 05/24/2017: LM: Normal LAD: 60% mid LAD calcific stenosis FFR +ve 0.77. Mild diffuse disease in small caliber  distal LAD  Successful complex PTCA and DES placement Onyx 2.75 X 15 mm Mid LAD  (60%-->0%, TIMI III - TIMI III) Ramus intermedius: Small caliber vessel with 50-70% stenoses LCx: Large dominant vessel with mid circumflex subtotal occlusion with retrograde filling of distal circumflex and PDA Successful PTCA and DES placement Onyx 4.0 X 22 mm Mid left circumflex  (  99%-->0%, TIMI I - TIMI III) RCA: Small caliber nondominant vessel with mild diffuse disease  Exercise myoview stress 05/18/2017: 1. The patient performed treadmill exercise using a Bruce protocol, completing 8 minutes. The patient completed an estimated workload of 10.12 METS achieving 85% maximum predicted heart rate. Stress symptoms included 4/10 chest pain and dyspnea.  2. The stress electrocardiogram revealed 1-1.5 mm of horizontal ST depression in lead (s):II, III, aVF, V4, V5, V6 consistent with myocardial ischemia.  3. This is an abnormal myocardial perfusion imaging study demonstrating a mixture of scar plus ischemia in the basal inferoseptal, basal inferior, basal inferolateral, mid inferoseptal, mid inferior, mid inferolateral and apical inferior myocardial wall(s) (LCx/RCA territory) 4. Left ventricular systolic function was abnormal with regional wall motion abnormalities. The left ventricular ejection fraction was calculated to be 39%.  5. This is a high risk study  Echocardiogram 05/15/2017: Left ventricle cavity is normal in size. Left ventricle regional wall motion findings: Basal anterolateral, Basal inferolateral, Mid anterolateral and Mid inferolateral hypokinesis. Calculated EF 55%. Structurally normal mitral valve with moderate (Grade II) regurgitation.  Review of Systems  Constitution: Negative for decreased appetite, malaise/fatigue, weight Maxwell and weight loss.  HENT: Negative for congestion.   Eyes: Negative for visual disturbance.  Cardiovascular: Positive for dyspnea on exertion (Mild, occasional,  stable). Negative for chest pain, claudication, leg swelling, palpitations and syncope.  Respiratory: Negative for shortness of breath.   Endocrine: Negative for cold intolerance.  Hematologic/Lymphatic: Does not bruise/bleed easily.  Skin: Negative for itching and rash.  Musculoskeletal: Negative for myalgias.  Gastrointestinal: Negative for abdominal pain, nausea and vomiting.  Genitourinary: Negative for dysuria.  Neurological: Negative for dizziness and weakness.  Psychiatric/Behavioral: The patient is not nervous/anxious.   All other systems reviewed and are negative.      Objective:   There were no vitals filed for this visit.   Physical Exam  Constitutional: He is oriented to person, place, and time. He appears well-developed and well-nourished. No distress.  HENT:  Head: Normocephalic and atraumatic.  Eyes: Pupils are equal, round, and reactive to light. Conjunctivae are normal.  Neck: No JVD present.  Cardiovascular: Normal rate, regular rhythm and intact distal pulses.  No murmur heard. Pulmonary/Chest: Effort normal and breath sounds normal. He has no wheezes. He has no rales.  Abdominal: Soft. Bowel sounds are normal. There is no rebound.  Musculoskeletal:        General: No edema.  Lymphadenopathy:    He has no cervical adenopathy.  Neurological: He is alert and oriented to person, place, and time. No cranial nerve deficit.  Skin: Skin is warm and dry.  Psychiatric: He has a normal mood and affect.  Nursing note and vitals reviewed.       Assessment & Recommendations:   73 year old pleasant Caucasian male with CAD s/p two vessel succefful PCI to mLCx and mLAD, controlled hypertension, type 2 DM, CKD 3, hyperlipidemia with statin intolerance, on PCSK9 inhibitor.  1. Coronary artery disease of native artery of native heart with stable angina pectoris (HCC) Stable. Continue aspirin, Repatha, metoprolol, ramipril, Ranexa.  2. Mixed hyperlipidemia Statin  intolerant. Well controlled on [place].   3. Essential hypertension Controlled  4. Dyspnea on exertion Mild, stable. Clinically euvolumic. Continue to monitor.  5. Type 2 diabetes mellitus without complication, without long-term current use of insulin ([place]) Followed by PCP. Stable. Jardiance stopped in the past due to A1C down to 5.3%   Nigel Mormon, MD [facility] [place] Pager: [phone] Office:  [phone] If no answer Cell [phone]

## 2018-11-22 ENCOUNTER — Encounter: Payer: Self-pay | Admitting: Cardiology

## 2018-12-02 ENCOUNTER — Other Ambulatory Visit: Payer: Self-pay

## 2018-12-02 MED ORDER — METFORMIN HCL ER 500 MG PO TB24: 500.0000 mg | ORAL_TABLET | Freq: Two times a day (BID) | ORAL | 1 refills | Status: DC

## 2018-12-04 DIAGNOSIS — N3941 Urge incontinence: Secondary | ICD-10-CM | POA: Diagnosis not present

## 2018-12-04 DIAGNOSIS — N403 Nodular prostate with lower urinary tract symptoms: Secondary | ICD-10-CM | POA: Diagnosis not present

## 2018-12-04 DIAGNOSIS — N200 Calculus of kidney: Secondary | ICD-10-CM | POA: Diagnosis not present

## 2018-12-04 DIAGNOSIS — N401 Enlarged prostate with lower urinary tract symptoms: Secondary | ICD-10-CM | POA: Diagnosis not present

## 2018-12-04 LAB — PSA: PSA: 2.66

## 2018-12-06 ENCOUNTER — Encounter: Payer: Self-pay | Admitting: Physician Assistant

## 2018-12-09 ENCOUNTER — Ambulatory Visit: Payer: PPO | Admitting: Physician Assistant

## 2018-12-10 ENCOUNTER — Other Ambulatory Visit: Payer: Self-pay | Admitting: *Deleted

## 2018-12-10 MED ORDER — RAMIPRIL 5 MG PO CAPS: 5.0000 mg | ORAL_CAPSULE | Freq: Every day | ORAL | 1 refills | Status: DC

## 2019-01-24 ENCOUNTER — Encounter: Payer: Self-pay | Admitting: *Deleted

## 2019-02-12 ENCOUNTER — Other Ambulatory Visit: Payer: Self-pay | Admitting: *Deleted

## 2019-02-12 MED ORDER — ALLOPURINOL 300 MG PO TABS: 300.0000 mg | ORAL_TABLET | Freq: Every day | ORAL | 1 refills | Status: DC

## 2019-02-26 ENCOUNTER — Encounter: Payer: Self-pay | Admitting: Physician Assistant

## 2019-02-26 ENCOUNTER — Other Ambulatory Visit: Payer: Self-pay

## 2019-02-26 ENCOUNTER — Ambulatory Visit (INDEPENDENT_AMBULATORY_CARE_PROVIDER_SITE_OTHER): Payer: PPO | Admitting: Physician Assistant

## 2019-02-26 VITALS — BP 155/81 | HR 58 | Temp 98.1°F | Ht 71.0 in | Wt 200.0 lb

## 2019-02-26 DIAGNOSIS — E119 Type 2 diabetes mellitus without complications: Secondary | ICD-10-CM

## 2019-02-26 LAB — BASIC METABOLIC PANEL
BUN: 32 mg/dL — ABNORMAL HIGH (ref 6–23)
CO2: 28 mEq/L (ref 19–32)
Calcium: 9.6 mg/dL (ref 8.4–10.5)
Chloride: 100 mEq/L (ref 96–112)
Creatinine, Ser: 1.45 mg/dL (ref 0.40–1.50)
GFR: 47.84 mL/min — ABNORMAL LOW (ref >60.00)
Glucose, Bld: 172 mg/dL — ABNORMAL HIGH (ref 70–99)
Potassium: 4.7 mEq/L (ref 3.5–5.1)
Sodium: 136 mEq/L (ref 135–145)

## 2019-02-26 LAB — HEMOGLOBIN A1C: Hgb A1c MFr Bld: 6.1 % (ref 4.6–6.5)

## 2019-02-26 NOTE — Progress Notes (Signed)
Nicholas Maxwell is a 73 y.o. male is here to discuss: Diabetes   I acted as a Education administrator for [employer], PA-C Anselmo Pickler, LPN  History of Present Illness:   Chief Complaint  Patient presents with  . Diabetes    Diabetes  He presents for his follow-up diabetic visit. He has type 2 diabetes mellitus. No MedicAlert identification noted. His disease course has been stable. There are no hypoglycemic associated symptoms. Pertinent negatives for hypoglycemia include no dizziness or headaches. There are no diabetic associated symptoms. Pertinent negatives for diabetes include no chest pain and no weight loss. There are no hypoglycemic complications. Symptoms are stable. Diabetic complications include heart disease. Risk factors for coronary artery disease include diabetes mellitus, dyslipidemia and male sex. Current diabetic treatment includes oral agent (monotherapy). He is compliant with treatment all of the time. His weight is stable. He is following a low fat/cholesterol and low salt diet. Meal planning includes avoidance of concentrated sweets. He has had a previous visit with a dietitian. He participates in exercise intermittently. There is no change in his home blood glucose trend. (Blood sugars have been running 120 to 123.) An ACE inhibitor/angiotensin II receptor blocker is being taken. He does not see a podiatrist.Eye exam is current.    Lab Results  Component Value Date   HGBA1C 5.7 11/20/2018   He is currently on Metformin 500 mg XR BID and tolerating well.   There are no preventive care reminders to display for this patient.  Past Medical History:  Diagnosis Date  . Abnormal stress test 05/18/2017  . Arthritis    "right knee" (05/24/2017)  . Chest pain   . Coronary artery disease   . Dyspnea on exertion 11/21/2018  . Enlarged prostate    followed by Dr. Jeffie Pollock  . Gout    R big toe usually  . High cholesterol   . History of kidney stones   . Hypertension   .  Myocardial infarction ([place]) 04/2017  . Pneumonia 1990s X 1  . Screening for AAA (abdominal aortic aneurysm) 11/21/2018  . Statin intolerance    Used >2 different statins  . Type II diabetes mellitus ([place])      Social History   Socioeconomic History  . Marital status: Married    Spouse name: Not on file  . Number of children: 2  . Years of education: Not on file  . Highest education level: Not on file  Occupational History  . Not on file  Social Needs  . Financial resource strain: Not on file  . Food insecurity:    Worry: Not on file    Inability: Not on file  . Transportation needs:    Medical: Not on file    Non-medical: Not on file  Tobacco Use  . Smoking status: Former Smoker    Packs/day: 1.00    Years: 30.00    Pack years: 30.00    Types: Cigarettes    Last attempt to quit: 2003    Years since quitting: 17.4  . Smokeless tobacco: Never Used  Substance and Sexual Activity  . Alcohol use: No  . Drug use: No  . Sexual activity: Not Currently  Lifestyle  . Physical activity:    Days per week: Not on file    Minutes per session: Not on file  . Stress: Not on file  Relationships  . Social connections:    Talks on phone: Not on file    Gets together: Not on  file    Attends religious service: Not on file    Active member of club or organization: Not on file    Attends meetings of clubs or organizations: Not on file    Relationship status: Not on file  . Intimate partner violence:    Fear of current or ex partner: Not on file    Emotionally abused: Not on file    Physically abused: Not on file    Forced sexual activity: Not on file  Other Topics Concern  . Not on file  Social History Narrative   Married   Son and daughter   Lynnette Caffey   Worked in Research officer, trade union    Past Surgical History:  Procedure Laterality Date  . CORONARY ANGIOPLASTY WITH STENT PLACEMENT  05/24/2017  . CORONARY STENT INTERVENTION N/A 05/24/2017   Procedure: CORONARY STENT  INTERVENTION;  Surgeon: Nigel Mormon, MD;  Location: [facility];  Service: Cardiovascular;  Laterality: N/A;  MID Circumflex 4.0x22 Onyx MID LAD 2.75x15 Onyx  . CYSTOSCOPY WITH URETEROSCOPY, STONE BASKETRY AND STENT PLACEMENT  ~ 1990  . HERNIA REPAIR    . INTRAVASCULAR PRESSURE WIRE/FFR STUDY N/A 05/24/2017   Procedure: INTRAVASCULAR PRESSURE WIRE/FFR STUDY;  Surgeon: Nigel Mormon, MD;  Location: [facility];  Service: Cardiovascular;  Laterality: N/A;  MID LAD  . KNEE ARTHROSCOPY Right 2016  . LEFT HEART CATH AND CORONARY ANGIOGRAPHY N/A 05/24/2017   Procedure: LEFT HEART CATH AND CORONARY ANGIOGRAPHY;  Surgeon: Nigel Mormon, MD;  Location: [facility];  Service: Cardiovascular;  Laterality: N/A;  . UMBILICAL HERNIA REPAIR  1992    Family History  Problem Relation Age of Onset  . Transient ischemic attack Father 64  . Bone cancer Father   . CAD Mother 73       CABG    PMHx, SurgHx, SocialHx, FamHx, Medications, and Allergies were reviewed in the Visit Navigator and updated as appropriate.   Patient Active Problem List   Diagnosis Date Noted  . Screening for AAA (abdominal aortic aneurysm) 11/21/2018  . Dyspnea on exertion 11/21/2018  . Statin intolerance 11/21/2018  . 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 History of kidney stones 02/26/2018  . Osteoarthritis of right knee 02/26/2018  . Post PTCA 05/24/2017  . Polyp of colon, adenomatous 12/08/2016  . Arthritis of carpometacarpal [facility]) joint of left thumb 11/26/2016  . Essential hypertension 05/12/2015  . 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 Hypertrophic and atrophic condition of skin 05/12/2015  . Hyperlipidemia 05/12/2015  . Other seborrheic keratosis 05/12/2015  . Type 2 diabetes mellitus without complication, without long-term current use of insulin ([place]) 05/12/2015  . Hypertrophy of prostate without urinary obstruction and other lower urinary tract symptoms  (LUTS) 04/08/2013    Social History   Tobacco Use  . Smoking status: Former Smoker    Packs/day: 1.00    Years: 30.00    Pack years: 30.00    Types: Cigarettes    Last attempt to quit: 2003    Years since quitting: 17.4  . Smokeless tobacco: Never Used  Substance Use Topics  . Alcohol use: No  . Drug use: No    Current Medications and Allergies:    Current Outpatient Medications:  .  ACCU-CHEK SOFTCLIX LANCETS lancets, USE TO CHECK BLOOD SUGAR TWICE DAILY, Disp: , Rfl:  .  alfuzosin (UROXATRAL) 10 MG 24 hr tablet, Take 10 mg by mouth daily with supper., Disp: , Rfl:  .  allopurinol (ZYLOPRIM)  300 MG tablet, Take 1 tablet (300 mg total) by mouth daily with lunch., Disp: 90 tablet, Rfl: 1 .  aspirin EC 81 MG tablet, Take 81 mg by mouth daily with breakfast., Disp: , Rfl:  .  finasteride (PROSCAR) 5 MG tablet, Take 5 mg by mouth daily., Disp: , Rfl:  .  Glucosamine-Chondroitin 750-600 MG TABS, Take by mouth., Disp: , Rfl:  .  glucose blood (ACCU-CHEK AVIVA) test strip, Use to check blood sugar twice daily, Disp: , Rfl:  .  metFORMIN (GLUCOPHAGE-XR) 500 MG 24 hr tablet, Take 1 tablet (500 mg total) by mouth 2 (two) times daily., Disp: 180 tablet, Rfl: 1 .  metoprolol succinate (TOPROL-XL) 25 MG 24 hr tablet, Take 1 tablet (25 mg total) by mouth daily with breakfast. (Patient taking differently: Take 25 mg by mouth. 1 tablet every other day), Disp: 90 tablet, Rfl: 0 .  nitroGLYCERIN (NITROSTAT) 0.4 MG SL tablet, Place 0.4 mg under the tongue every 5 (five) minutes x 3 doses as needed. For chest pain., Disp: , Rfl:  .  ramipril (ALTACE) 5 MG capsule, Take 1 capsule (5 mg total) by mouth daily., Disp: 90 capsule, Rfl: 1 .  ranolazine (RANEXA) 500 MG 12 hr tablet, Take 2 tablets (1,000 mg total) by mouth 2 (two) times daily., Disp: 180 tablet, Rfl: 3 .  REPATHA SURECLICK 606 MG/ML SOAJ, Inject 140 mg into the skin every 14 (fourteen) days., Disp: , Rfl:  .  azelastine (ASTELIN) 0.1 %  nasal spray, 2 sprays each nostril four times daily for 3-5 days then twice a day, Disp: , Rfl:    Allergies  Allergen Reactions  . Crestor [Rosuvastatin Calcium] Anaphylaxis and Swelling    Tongue swells/throat closes  . Statins Other (See Comments)    Muscle/joint pain with ALL other statin drug but Crestor is severe    Review of Systems   Review of Systems  Constitutional: Negative for chills, fever, malaise/fatigue and weight loss.  Respiratory: Negative for shortness of breath.   Cardiovascular: Negative for chest pain, orthopnea, claudication and leg swelling.  Gastrointestinal: Negative for heartburn, nausea and vomiting.  Neurological: Negative for dizziness, tingling and headaches.    Vitals:   Vitals:   02/26/19 0924 02/26/19 0949  BP: (!) 161/82 (!) 155/81  Pulse: (!) 58   Temp: 98.1 F (36.7 C)   SpO2: 98%   Weight: 200 lb (90.7 kg)   Height: 5\' 11"  (1.803 m)      Body mass index is 27.89 kg/m.   Physical Exam:    Physical Exam Vitals signs and nursing note reviewed.  Constitutional:      General: He is not in acute distress.    Appearance: He is well-developed. He is not ill-appearing or toxic-appearing.  Cardiovascular:     Rate and Rhythm: Normal rate and regular rhythm.     Pulses: Normal pulses.     Heart sounds: Normal heart sounds, S1 normal and S2 normal.     Comments: No LE edema Pulmonary:     Effort: Pulmonary effort is normal.     Breath sounds: Normal breath sounds.  Skin:    General: Skin is warm and dry.  Neurological:     Mental Status: He is alert.     GCS: GCS eye subscore is 4. GCS verbal subscore is 5. GCS motor subscore is 6.  Psychiatric:        Speech: Speech normal.        Behavior: Behavior normal.  Behavior is cooperative.      Assessment and Plan:    Deionte was seen today for diabetes.  Diagnoses and all orders for this visit:  Type 2 diabetes mellitus without complication, without long-term current use of  insulin (HCC) -     Hemoglobin A1c -     Basic metabolic panel   Repeat labs today. Will adjust regimen based on HgbA1c results.  . Reviewed expectations re: course of current medical issues. . Discussed self-management of symptoms. . Outlined signs and symptoms indicating need for more acute intervention. . Patient verbalized understanding and all questions were answered. . See orders for this visit as documented in the electronic medical record. . Patient received an After Visit Summary.  CMA or LPN served as scribe during this visit. History, Physical, and Plan performed by medical provider. The above documentation has been reviewed and is accurate and complete.  [name], PA-C [place] [place] 02/26/2019  Follow-up: No follow-ups on file.

## 2019-02-26 NOTE — Patient Instructions (Signed)
It was great to see you!  We will be in touch regarding your labs and any further recommendations via MyChart.  Let's follow-up in 91 days, sooner if you have concerns.  Take care,  [name] PA-C

## 2019-02-27 ENCOUNTER — Encounter: Payer: Self-pay | Admitting: Physician Assistant

## 2019-02-27 DIAGNOSIS — N401 Enlarged prostate with lower urinary tract symptoms: Secondary | ICD-10-CM | POA: Diagnosis not present

## 2019-03-04 ENCOUNTER — Other Ambulatory Visit: Payer: Self-pay | Admitting: *Deleted

## 2019-03-04 NOTE — Patient Outreach (Signed)
[facility]) Care Management  03/04/2019  Nicholas Maxwell Dec 02, 1945 [id number]   HTA HRA Telephone Screen     Outreach Attempt:  Multiple unsuccessful outreach attempts to patient in the months of March and April to complete Health Risk Assessment Screening.  HIPAA compliant voice messages left.   Plan:   RN Health Coach has sent Unsuccessful Letter.  RN Health Coach will make another outreach attempt to patient within the month of June.  [place] [phone] [email or web address]

## 2019-03-05 DIAGNOSIS — N401 Enlarged prostate with lower urinary tract symptoms: Secondary | ICD-10-CM | POA: Diagnosis not present

## 2019-03-05 DIAGNOSIS — R351 Nocturia: Secondary | ICD-10-CM | POA: Diagnosis not present

## 2019-03-05 DIAGNOSIS — N3941 Urge incontinence: Secondary | ICD-10-CM | POA: Diagnosis not present

## 2019-03-27 ENCOUNTER — Ambulatory Visit: Payer: Self-pay | Admitting: *Deleted

## 2019-04-01 ENCOUNTER — Other Ambulatory Visit: Payer: Self-pay | Admitting: *Deleted

## 2019-04-01 NOTE — Patient Outreach (Signed)
[facility]) Care Management  04/01/2019  Nicholas Maxwell [birth year] [id number]   Case Closure/Transition to [facility]/CCI   Outreach:  Patient case has been transitioned to [facility]/CCI for further Care Management assistance.  Plan: RN Health Coach will close case at this time. RN Health Coach will send primary care provider Care Management Case Closure Letter. RN Health Coach will make patient inactive with [facility] Care Management at this time.  [name] [phone] [email or web address]

## 2019-04-03 ENCOUNTER — Other Ambulatory Visit: Payer: Self-pay | Admitting: Cardiology

## 2019-04-03 NOTE — Telephone Encounter (Signed)
Please fill

## 2019-04-18 ENCOUNTER — Encounter: Payer: Self-pay | Admitting: Physician Assistant

## 2019-04-19 NOTE — Telephone Encounter (Signed)
Nicholas Bar, do you want to do his Annual Wellness visit? If so I will call and schedule him.

## 2019-05-12 MED ORDER — METOPROLOL SUCCINATE ER 25 MG PO TB24: 25.0000 mg | ORAL_TABLET | Freq: Every day | ORAL | 1 refills | Status: DC

## 2019-05-12 NOTE — Telephone Encounter (Signed)
Please read

## 2019-05-26 ENCOUNTER — Ambulatory Visit: Payer: PPO | Admitting: Cardiology

## 2019-05-26 ENCOUNTER — Encounter: Payer: Self-pay | Admitting: Cardiology

## 2019-05-26 ENCOUNTER — Other Ambulatory Visit: Payer: Self-pay

## 2019-05-26 VITALS — BP 123/71 | HR 56 | Ht 70.0 in | Wt 201.0 lb

## 2019-05-26 DIAGNOSIS — I10 Essential (primary) hypertension: Secondary | ICD-10-CM | POA: Diagnosis not present

## 2019-05-26 DIAGNOSIS — I2510 Atherosclerotic heart disease of native coronary artery without angina pectoris: Secondary | ICD-10-CM | POA: Diagnosis not present

## 2019-05-26 DIAGNOSIS — E782 Mixed hyperlipidemia: Secondary | ICD-10-CM

## 2019-05-26 MED ORDER — METOPROLOL SUCCINATE ER 25 MG PO TB24: 25.0000 mg | ORAL_TABLET | Freq: Every day | ORAL | 3 refills | Status: DC

## 2019-05-26 NOTE — Progress Notes (Signed)
Patient is here for follow up visit.  Subjective:   Nicholas Maxwell, male    DOB: [birth year], 73 y.o.   MRN: [id number]  Chief Complaint  Patient presents with  . Coronary Artery Disease  . Hypertension  . Follow-up    HPI   73 year old pleasant Caucasian male with CAD s/p two vessel succefful PCI to mLCx and mLAD, controlled hypertension, type 2 DM, CKD 3, hyperlipidemia with statin intolerance, on PCSK9 inhibitor.  He is doing well without any chest pain. He is now back  doing his regular water aerobic after a gap of 4 months due to gym closure. He has occasional mild shortness of breath, which is unchanged from prior.    His metformin was recently stopped given A1C<6. He has recently noticed upward trend in his fasting BG. He has upcoming appt with his PCP later this week.   Past Medical History:  Diagnosis Date  . Abnormal stress test 05/18/2017  . Arthritis    "right knee" (05/24/2017)  . Chest pain   . Coronary artery disease   . Dyspnea on exertion 11/21/2018  . Enlarged prostate    followed by Dr. Jeffie Pollock  . Gout    R big toe usually  . High cholesterol   . History of kidney stones   . Hypertension   . Myocardial infarction ([place]) 04/2017  . Pneumonia 1990s X 1  . Screening for AAA (abdominal aortic aneurysm) 11/21/2018  . Statin intolerance    Used >2 different statins  . Type II diabetes mellitus ([place])     Past Surgical History:  Procedure Laterality Date  . CORONARY ANGIOPLASTY WITH STENT PLACEMENT  05/24/2017  . CORONARY STENT INTERVENTION N/A 05/24/2017   Procedure: CORONARY STENT INTERVENTION;  Surgeon: Nigel Mormon, MD;  Location: [facility];  Service: Cardiovascular;  Laterality: N/A;  MID Circumflex 4.0x22 Onyx MID LAD 2.75x15 Onyx  . CYSTOSCOPY WITH URETEROSCOPY, STONE BASKETRY AND STENT PLACEMENT  ~ 1990  . HERNIA REPAIR    . INTRAVASCULAR PRESSURE WIRE/FFR STUDY N/A 05/24/2017   Procedure: INTRAVASCULAR PRESSURE WIRE/FFR STUDY;   Surgeon: Nigel Mormon, MD;  Location: [facility];  Service: Cardiovascular;  Laterality: N/A;  MID LAD  . KNEE ARTHROSCOPY Right 2016  . LEFT HEART CATH AND CORONARY ANGIOGRAPHY N/A 05/24/2017   Procedure: LEFT HEART CATH AND CORONARY ANGIOGRAPHY;  Surgeon: Nigel Mormon, MD;  Location: [facility];  Service: Cardiovascular;  Laterality: N/A;  . UMBILICAL HERNIA REPAIR  1992    Social History   Socioeconomic History  . Marital status: Married    Spouse name: Not on file  . Number of children: 2  . Years of education: Not on file  . Highest education level: Not on file  Occupational History  . Not on file  Social Needs  . Financial resource strain: Not on file  . Food insecurity    Worry: Not on file    Inability: Not on file  . Transportation needs    Medical: Not on file    Non-medical: Not on file  Tobacco Use  . Smoking status: Former Smoker    Packs/day: 1.00    Years: 30.00    Pack years: 30.00    Types: Cigarettes    Quit date: 2003    Years since quitting: 17.6  . Smokeless tobacco: Never Used  Substance and Sexual Activity  . Alcohol use: No  . Drug use: No  . Sexual  activity: Not Currently  Lifestyle  . Physical activity    Days per week: Not on file    Minutes per session: Not on file  . Stress: Not on file  Relationships  . Social Herbalist on phone: Not on file    Gets together: Not on file    Attends religious service: Not on file    Active member of club or organization: Not on file    Attends meetings of clubs or organizations: Not on file    Relationship status: Not on file  . Intimate partner violence    Fear of current or ex partner: Not on file    Emotionally abused: Not on file    Physically abused: Not on file    Forced sexual activity: Not on file  Other Topics Concern  . Not on file  Social History Narrative   Married   Son and daughter   Lynnette Caffey   Worked in Research officer, trade union    Current  Outpatient Medications on File Prior to Visit  Medication Sig Dispense Refill  . ACCU-CHEK SOFTCLIX LANCETS lancets USE TO CHECK BLOOD SUGAR TWICE DAILY    . alfuzosin (UROXATRAL) 10 MG 24 hr tablet Take 10 mg by mouth daily with supper.    Marland Kitchen allopurinol (ZYLOPRIM) 300 MG tablet Take 1 tablet (300 mg total) by mouth daily with lunch. 90 tablet 1  . aspirin EC 81 MG tablet Take 81 mg by mouth daily with breakfast.    . azelastine (ASTELIN) 0.1 % nasal spray 2 sprays each nostril four times daily for 3-5 days then twice a day    . finasteride (PROSCAR) 5 MG tablet Take 5 mg by mouth daily.    . Glucosamine-Chondroitin 750-600 MG TABS Take by mouth.    Marland Kitchen glucose blood (ACCU-CHEK AVIVA) test strip Use to check blood sugar twice daily    . metFORMIN (GLUCOPHAGE-XR) 500 MG 24 hr tablet Take 1 tablet (500 mg total) by mouth 2 (two) times daily. 180 tablet 1  . metoprolol succinate (TOPROL-XL) 25 MG 24 hr tablet Take 1 tablet (25 mg total) by mouth daily. 1 tablet every other day 30 tablet 1  . nitroGLYCERIN (NITROSTAT) 0.4 MG SL tablet Place 0.4 mg under the tongue every 5 (five) minutes x 3 doses as needed. For chest pain.    . ramipril (ALTACE) 5 MG capsule Take 1 capsule (5 mg total) by mouth daily. 90 capsule 1  . ranolazine (RANEXA) 500 MG 12 hr tablet TAKE 2 TABLETS BY MOUTH TWICE A DAY 360 tablet 1  . REPATHA SURECLICK [id number] MG/ML SOAJ Inject 140 mg into the skin every 14 (fourteen) days.     No current facility-administered medications on file prior to visit.     Cardiovascular studies:  EKG 11/21/2018: Sinus rhythm 58 bpm. Normal axis. Normal conduction. Normal EKG.   Abdominal aortic duplex 09/11/2018: The maximum aorta diameter is 2.53 cm (mid). Diffuse heterogeneous plaque observed in the proximal, mid and distal aorta.  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  Normal flow velocities noted in aorta and iliac arteries. Rec: Rescan in 5  years if clinically indicated.  Cath 05/24/2017: LM: Normal LAD: 60% mid LAD calcific stenosis FFR +ve 0.77. Mild diffuse disease in small caliber distal LAD  Successful complex PTCA and DES placement Onyx 2.75 X 15 mm Mid LAD  (60%-->0%, TIMI III - TIMI III) Ramus intermedius: Small caliber  vessel with 50-70% stenoses LCx: Large dominant vessel with mid circumflex subtotal occlusion with retrograde filling of distal circumflex and PDA Successful PTCA and DES placement Onyx 4.0 X 22 mm Mid left circumflex  (99%-->0%, TIMI I - TIMI III) RCA: Small caliber nondominant vessel with mild diffuse disease  Exercise myoview stress 05/18/2017: 1. The patient performed treadmill exercise using a Bruce protocol, completing 8 minutes. The patient completed an estimated workload of 10.12 METS achieving 85% maximum predicted heart rate. Stress symptoms included 4/10 chest pain and dyspnea.  2. The stress electrocardiogram revealed 1-1.5 mm of horizontal ST depression in lead (s):II, III, aVF, V4, V5, V6 consistent with myocardial ischemia.  3. This is an abnormal myocardial perfusion imaging study demonstrating a mixture of scar plus ischemia in the basal inferoseptal, basal inferior, basal inferolateral, mid inferoseptal, mid inferior, mid inferolateral and apical inferior myocardial wall(s) (LCx/RCA territory) 4. Left ventricular systolic function was abnormal with regional wall motion abnormalities. The left ventricular ejection fraction was calculated to be 39%.  5. This is a high risk study  Echocardiogram 05/15/2017: Left ventricle cavity is normal in size. Left ventricle regional wall motion findings: Basal anterolateral, Basal inferolateral, Mid anterolateral and Mid inferolateral hypokinesis. Calculated EF 55%. Structurally normal mitral valve with moderate (Grade II) regurgitation.  Recent labs: Results for NUNZIO, OLAFSON (MRN [id number]) as of 05/26/2019 08:22  Ref. Range 11/20/2018  10:12 02/26/2019 [id number]  BASIC METABOLIC PANEL Unknown Rpt (A) Rpt (A)  Sodium Latest Ref Range: 135 - 145 mEq/L 138 136  Potassium Latest Ref Range: 3.5 - 5.1 mEq/L 4.9 4.7  Chloride Latest Ref Range: 96 - 112 mEq/L 103 100  CO2 Latest Ref Range: 19 - 32 mEq/L 27 28  Glucose Latest Ref Range: 70 - 99 mg/dL 112 (H) 172 (H)  BUN Latest Ref Range: 6 - 23 mg/dL 30 (H) 32 (H)  Creatinine Latest Ref Range: 0.40 - 1.50 mg/dL 1.49 1.45  Calcium Latest Ref Range: 8.4 - 10.5 mg/dL 9.4 9.6  GFR Latest Ref Range: >60.00 mL/min 46.39 (L) 47.84 (L)    Review of Systems  Constitution: Negative for decreased appetite, malaise/fatigue, weight Maxwell and weight loss.  HENT: Negative for congestion.   Eyes: Negative for visual disturbance.  Cardiovascular: Positive for dyspnea on exertion (Mild, occasional, stable). Negative for chest pain, claudication, leg swelling, palpitations and syncope.  Respiratory: Negative for shortness of breath.   Endocrine: Negative for cold intolerance.  Hematologic/Lymphatic: Does not bruise/bleed easily.  Skin: Negative for itching and rash.  Musculoskeletal: Negative for myalgias.  Gastrointestinal: Negative for abdominal pain, nausea and vomiting.  Genitourinary: Negative for dysuria.  Neurological: Negative for dizziness and weakness.  Psychiatric/Behavioral: The patient is not nervous/anxious.   All other systems reviewed and are negative.      Objective:   Vitals:   05/26/19 1004  BP: 123/71  Pulse: (!) 56  SpO2: 99%     Physical Exam  Constitutional: He is oriented to person, place, and time. He appears well-developed and well-nourished. No distress.  HENT:  Head: Normocephalic and atraumatic.  Eyes: Pupils are equal, round, and reactive to light. Conjunctivae are normal.  Neck: No JVD present.  Cardiovascular: Normal rate, regular rhythm and intact distal pulses.  No murmur heard. Pulmonary/Chest: Effort normal and breath sounds normal. He has no  wheezes. He has no rales.  Abdominal: Soft. Bowel sounds are normal. There is no rebound.  Musculoskeletal:        General: No edema.  Lymphadenopathy:  He has no cervical adenopathy.  Neurological: He is alert and oriented to person, place, and time. No cranial nerve deficit.  Skin: Skin is warm and dry.  Psychiatric: He has a normal mood and affect.  Nursing note and vitals reviewed.       Assessment & Recommendations:   73 year old pleasant Caucasian male with CAD s/p two vessel succefful PCI to mLCx and mLAD, controlled hypertension, type 2 DM, CKD 3, hyperlipidemia with statin intolerance, on PCSK9 inhibitor.  1. Coronary artery disease of native artery of native heart with stable angina pectoris (HCC) Stable. Continue aspirin, Repatha, metoprolol, ramipril, Ranexa.  2. Mixed hyperlipidemia Statin intolerant. Well controlled on [place].  Upcoming lipid panel with PCP later this week.   3. Essential hypertension Controlled  4. Dyspnea on exertion Mild, stable. Clinically euvolumic. Continue to monitor.  5. Type 2 diabetes mellitus without complication, without long-term current use of insulin ([place]) Followed by PCP.  F/u in 1 year.   Nigel Mormon, MD [facility]. PA Pager: [phone] Office: [phone] If no answer Cell [phone]

## 2019-05-28 ENCOUNTER — Encounter: Payer: Self-pay | Admitting: Physician Assistant

## 2019-05-28 ENCOUNTER — Other Ambulatory Visit: Payer: Self-pay

## 2019-05-28 ENCOUNTER — Ambulatory Visit (INDEPENDENT_AMBULATORY_CARE_PROVIDER_SITE_OTHER): Payer: PPO | Admitting: Physician Assistant

## 2019-05-28 VITALS — BP 130/80 | HR 53 | Temp 98.1°F | Ht 70.0 in | Wt 199.4 lb

## 2019-05-28 DIAGNOSIS — N183 Chronic kidney disease, stage 3 unspecified: Secondary | ICD-10-CM

## 2019-05-28 DIAGNOSIS — E119 Type 2 diabetes mellitus without complications: Secondary | ICD-10-CM | POA: Diagnosis not present

## 2019-05-28 DIAGNOSIS — E782 Mixed hyperlipidemia: Secondary | ICD-10-CM

## 2019-05-28 DIAGNOSIS — Z23 Encounter for immunization: Secondary | ICD-10-CM | POA: Diagnosis not present

## 2019-05-28 LAB — BASIC METABOLIC PANEL
BUN: 24 mg/dL — ABNORMAL HIGH (ref 6–23)
CO2: 25 mEq/L (ref 19–32)
Calcium: 9.6 mg/dL (ref 8.4–10.5)
Chloride: 102 mEq/L (ref 96–112)
Creatinine, Ser: 1.42 mg/dL (ref 0.40–1.50)
GFR: 48.97 mL/min — ABNORMAL LOW (ref >60.00)
Glucose, Bld: 131 mg/dL — ABNORMAL HIGH (ref 70–99)
Potassium: 4.5 mEq/L (ref 3.5–5.1)
Sodium: 137 mEq/L (ref 135–145)

## 2019-05-28 LAB — LIPID PANEL
Cholesterol: 132 mg/dL (ref 0–200)
HDL: 43.9 mg/dL (ref >39.00)
NonHDL: 88.33
Total CHOL/HDL Ratio: 3
Triglycerides: 274 mg/dL — ABNORMAL HIGH (ref 0.0–149.0)
VLDL: 54.8 mg/dL — ABNORMAL HIGH (ref 0.0–40.0)

## 2019-05-28 LAB — HEMOGLOBIN A1C: Hgb A1c MFr Bld: 6.1 % (ref 4.6–6.5)

## 2019-05-28 LAB — LDL CHOLESTEROL, DIRECT: Direct LDL: 59 mg/dL

## 2019-05-28 NOTE — Patient Instructions (Signed)
It was great to see you!  Schedule an Annual Wellness Visit with Loma Sousa on your way out with -- this must be after Nov 20th. You can see me a week afterwards (must be 91 days from today.)  I will be in touch with lab work.  Take care,  [name] PA-C

## 2019-05-28 NOTE — Addendum Note (Signed)
Addended by: Marian Sorrow on: 05/28/2019 10:30 AM   Modules accepted: Orders

## 2019-05-28 NOTE — Progress Notes (Signed)
Nicholas Maxwell is a 73 y.o. male is here to follow up on diabetes and hypertension.  I acted as a Education administrator for [employer], PA-C Anselmo Pickler, LPN  History of Present Illness:   Chief Complaint  Patient presents with  . Diabetes    Diabetes He presents for his follow-up diabetic visit. He has type 2 diabetes mellitus. No MedicAlert identification noted. His disease course has been stable. Hypoglycemia symptoms include dizziness and hunger. (Happened couples weeks ago after working out at [employer] on his way home had to stop and get something to eat and then felt better.) Associated symptoms include foot paresthesias (off and on ? due to back issues). There are no hypoglycemic complications. Symptoms are stable. Diabetic complications include heart disease. Risk factors for coronary artery disease include dyslipidemia and male sex. When asked about current treatments, none were reported. He is compliant with treatment all of the time. His weight is increasing steadily (since stopping Metformin on May 28th.). He is following a low salt and low fat/cholesterol diet. Meal planning includes avoidance of concentrated sweets. He has not had a previous visit with a dietitian. Exercise: Twice a week water aerobics. His home blood glucose trend is increasing steadily. (Pt is checking blood sugar daily since stopping Metformin sugars are running 125-162. Today it was 151. Noon & evening readings are 96. ) He does not see a podiatrist.Eye exam is current (will send report).   We stopped his Metformin after his last visit due to worsening renal function.  Overall he feels fine, had 1 episode of hypoglycemia in the past few months, but this is actually fewer episodes that he normally has.  He is back into the gym and is excited about this.  Hyperlipidemia He is managed on Repatha at this time, he has been intolerant to statins.  He is due for lipid panel.  He is followed by cardiology and they had  asked [place] to get a lipid panel today.  Health Maintenance Due  Topic Date Due  . OPHTHALMOLOGY EXAM  02/20/1956  . INFLUENZA VACCINE  05/03/2019    Past Medical History:  Diagnosis Date  . History of kidney stones   . Myocardial infarction ([place]) 04/2017  . Statin intolerance    Used >2 different statins     Social History   Socioeconomic History  . Marital status: Married    Spouse name: Not on file  . Number of children: 2  . Years of education: Not on file  . Highest education level: Not on file  Occupational History  . Not on file  Social Needs  . Financial resource strain: Not on file  . Food insecurity    Worry: Not on file    Inability: Not on file  . Transportation needs    Medical: Not on file    Non-medical: Not on file  Tobacco Use  . Smoking status: Former Smoker    Packs/day: 1.00    Years: 30.00    Pack years: 30.00    Types: Cigarettes    Quit date: 2003    Years since quitting: 17.6  . Smokeless tobacco: Never Used  Substance and Sexual Activity  . Alcohol use: No  . Drug use: No  . Sexual activity: Not Currently  Lifestyle  . Physical activity    Days per week: Not on file    Minutes per session: Not on file  . Stress: Not on file  Relationships  . Social connections  Talks on phone: Not on file    Gets together: Not on file    Attends religious service: Not on file    Active member of club or organization: Not on file    Attends meetings of clubs or organizations: Not on file    Relationship status: Not on file  . Intimate partner violence    Fear of current or ex partner: Not on file    Emotionally abused: Not on file    Physically abused: Not on file    Forced sexual activity: Not on file  Other Topics Concern  . Not on file  Social History Narrative   Married   Son and daughter   Lynnette Caffey   Worked in Research officer, trade union    Past Surgical History:  Procedure Laterality Date  . CORONARY ANGIOPLASTY WITH STENT PLACEMENT   05/24/2017  . CORONARY STENT INTERVENTION N/A 05/24/2017   Procedure: CORONARY STENT INTERVENTION;  Surgeon: Nigel Mormon, MD;  Location: [facility];  Service: Cardiovascular;  Laterality: N/A;  MID Circumflex 4.0x22 Onyx MID LAD 2.75x15 Onyx  . CYSTOSCOPY WITH URETEROSCOPY, STONE BASKETRY AND STENT PLACEMENT  ~ 1990  . HERNIA REPAIR    . INTRAVASCULAR PRESSURE WIRE/FFR STUDY N/A 05/24/2017   Procedure: INTRAVASCULAR PRESSURE WIRE/FFR STUDY;  Surgeon: Nigel Mormon, MD;  Location: [facility];  Service: Cardiovascular;  Laterality: N/A;  MID LAD  . KNEE ARTHROSCOPY Right 2016  . LEFT HEART CATH AND CORONARY ANGIOGRAPHY N/A 05/24/2017   Procedure: LEFT HEART CATH AND CORONARY ANGIOGRAPHY;  Surgeon: Nigel Mormon, MD;  Location: [facility];  Service: Cardiovascular;  Laterality: N/A;  . UMBILICAL HERNIA REPAIR  1992    Family History  Problem Relation Age of Onset  . Transient ischemic attack Father 67  . Bone cancer Father   . CAD Mother 54       CABG    PMHx, SurgHx, SocialHx, FamHx, Medications, and Allergies were reviewed in the Visit Navigator and updated as appropriate.   Patient Active Problem List   Diagnosis Date Noted  . Statin intolerance 11/21/2018  . 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 History of kidney stones 02/26/2018  . Osteoarthritis of right knee 02/26/2018  . Post PTCA 05/24/2017  . Myocardial infarction ([place]) 04/2017  . Polyp of colon, adenomatous 12/08/2016  . 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 Essential hypertension 05/12/2015  . 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 Hypertrophic and atrophic condition of skin 05/12/2015  . Hyperlipidemia 05/12/2015  . Other seborrheic keratosis 05/12/2015  . Type 2 diabetes mellitus without complication, without long-term current use of insulin ([place]) 05/12/2015  . Hypertrophy of prostate without urinary obstruction and other  lower urinary tract symptoms (LUTS) 04/08/2013    Social History   Tobacco Use  . Smoking status: Former Smoker    Packs/day: 1.00    Years: 30.00    Pack years: 30.00    Types: Cigarettes    Quit date: 2003    Years since quitting: 17.6  . Smokeless tobacco: Never Used  Substance Use Topics  . Alcohol use: No  . Drug use: No    Current Medications and Allergies:    Current Outpatient Medications:  .  ACCU-CHEK SOFTCLIX LANCETS lancets, USE TO CHECK BLOOD SUGAR TWICE DAILY, Disp: , Rfl:  .  alfuzosin (UROXATRAL) 10 MG 24 hr tablet, Take 10 mg by mouth daily with supper., Disp: , Rfl:  .  allopurinol (ZYLOPRIM) 300 MG tablet, Take 1 tablet (300 mg total) by mouth daily with lunch., Disp: 90 tablet, Rfl: 1 .  aspirin EC 81 MG tablet, Take 81 mg by mouth daily with breakfast., Disp: , Rfl:  .  finasteride (PROSCAR) 5 MG tablet, Take 5 mg by mouth daily., Disp: , Rfl:  .  Glucosamine-Chondroitin 750-600 MG TABS, Take by mouth., Disp: , Rfl:  .  glucose blood (ACCU-CHEK AVIVA) test strip, Use to check blood sugar twice daily, Disp: , Rfl:  .  metoprolol succinate (TOPROL-XL) 25 MG 24 hr tablet, Take 1 tablet (25 mg total) by mouth daily. 1 tablet every other day, Disp: 90 tablet, Rfl: 3 .  nitroGLYCERIN (NITROSTAT) 0.4 MG SL tablet, Place 0.4 mg under the tongue every 5 (five) minutes x 3 doses as needed. For chest pain., Disp: , Rfl:  .  ramipril (ALTACE) 5 MG capsule, Take 1 capsule (5 mg total) by mouth daily., Disp: 90 capsule, Rfl: 1 .  ranolazine (RANEXA) 500 MG 12 hr tablet, TAKE 2 TABLETS BY MOUTH TWICE A DAY, Disp: 360 tablet, Rfl: 1 .  REPATHA SURECLICK [id number] MG/ML SOAJ, Inject 140 mg into the skin every 14 (fourteen) days., Disp: , Rfl:    Allergies  Allergen Reactions  . Crestor [Rosuvastatin Calcium] Anaphylaxis and Swelling    Tongue swells/throat closes  . Statins Other (See Comments)    Muscle/joint pain with ALL other statin drug but Crestor is severe    Review  of Systems   Review of Systems  Neurological: Positive for dizziness.    Vitals:   Vitals:   05/28/19 0958  BP: 130/80  Pulse: (!) 53  Temp: 98.1 F (36.7 C)  TempSrc: Temporal  SpO2: 96%  Weight: 199 lb 6.1 oz (90.4 kg)  Height: 5\' 10"  (1.778 m)     Body mass index is 28.61 kg/m.   Physical Exam:    Physical Exam Vitals signs and nursing note reviewed.  Constitutional:      General: He is not in acute distress.    Appearance: He is well-developed. He is not ill-appearing or toxic-appearing.  Cardiovascular:     Rate and Rhythm: Normal rate and regular rhythm.     Pulses: Normal pulses.     Heart sounds: Normal heart sounds, S1 normal and S2 normal.     Comments: No LE edema Pulmonary:     Effort: Pulmonary effort is normal.     Breath sounds: Normal breath sounds.  Skin:    General: Skin is warm and dry.  Neurological:     Mental Status: He is alert.     GCS: GCS eye subscore is 4. GCS verbal subscore is 5. GCS motor subscore is 6.  Psychiatric:        Speech: Speech normal.        Behavior: Behavior normal. Behavior is cooperative.      Assessment and Plan:    Nicholas Maxwell was seen today for diabetes.  Diagnoses and all orders for this visit:  Type 2 diabetes mellitus without complication, without long-term current use of insulin (HCC) We will recheck hemoglobin A1c today.  He is currently not on any medications, however we did discuss possibly starting an SGLT-2, likely Invokana, due to decreased renal function and cardiovascular benefit.  Will determine this based on lab results. -     Basic metabolic panel -     Hemoglobin A1c  Encounter for lipid screening for cardiovascular disease -  Lipid panel  Chronic kidney disease (CKD), stage III (moderate) (HCC) Avoid nephrotoxic agents.  Continue to monitor.   . Reviewed expectations re: course of current medical issues. . Discussed self-management of symptoms. . Outlined signs and symptoms  indicating need for more acute intervention. . Patient verbalized understanding and all questions were answered. . See orders for this visit as documented in the electronic medical record. . Patient received an After Visit Summary.  CMA or LPN served as scribe during this visit. History, Physical, and Plan performed by medical provider. The above documentation has been reviewed and is accurate and complete.  [name], PA-C , [place] [facility] 05/28/2019  Follow-up: No follow-ups on file.

## 2019-06-03 ENCOUNTER — Other Ambulatory Visit: Payer: Self-pay | Admitting: Physician Assistant

## 2019-06-04 ENCOUNTER — Other Ambulatory Visit: Payer: Self-pay | Admitting: Cardiology

## 2019-06-04 DIAGNOSIS — I2510 Atherosclerotic heart disease of native coronary artery without angina pectoris: Secondary | ICD-10-CM

## 2019-06-30 DIAGNOSIS — M1711 Unilateral primary osteoarthritis, right knee: Secondary | ICD-10-CM | POA: Diagnosis not present

## 2019-06-30 DIAGNOSIS — M25561 Pain in right knee: Secondary | ICD-10-CM | POA: Diagnosis not present

## 2019-07-30 ENCOUNTER — Other Ambulatory Visit: Payer: Self-pay | Admitting: Physician Assistant

## 2019-08-24 ENCOUNTER — Other Ambulatory Visit: Payer: Self-pay | Admitting: Cardiology

## 2019-08-25 ENCOUNTER — Ambulatory Visit (INDEPENDENT_AMBULATORY_CARE_PROVIDER_SITE_OTHER): Payer: PPO

## 2019-08-25 ENCOUNTER — Other Ambulatory Visit: Payer: Self-pay

## 2019-08-25 VITALS — BP 108/62 | HR 56 | Temp 98.0°F | Ht 70.0 in | Wt 205.6 lb

## 2019-08-25 DIAGNOSIS — Z0000 Encounter for general adult medical examination without abnormal findings: Secondary | ICD-10-CM

## 2019-08-25 NOTE — Patient Instructions (Signed)
[name] , Thank you for taking time to come for your Medicare Wellness Visit. I appreciate your ongoing commitment to your health goals. Please review the following plan we discussed and let me know if I can assist you in the future.   Screening recommendations/referrals: Colorectal Screening: up to date; last 12/05/16  Vision and Dental Exams: Recommended annual ophthalmology exams for early detection of glaucoma and other disorders of the eye Recommended annual dental exams for proper oral hygiene  Diabetic Exams: Diabetic Eye Exam: recommended yearly Diabetic Foot Exam: recommended yearly; up to date  Vaccinations: Influenza vaccine: completed 05/28/19  Pneumococcal vaccine: up to date; last 11/09/16  Tdap vaccine: up to date; last 02/22/18 Shingles vaccine: Please call your insurance company to determine your out of pocket expense for the Shingrix vaccine. You may receive this vaccine at your local pharmacy.  Advanced directives: Please bring a copy of your POA (Power of Attorney) and/or Living Will to your next appointment.  Goals: Recommend to drink at least 6-8 8oz glasses of water per day and consume a balanced diet rich in fresh fruits and vegetables.   Next appointment: Please schedule your Annual Wellness Visit with your Nurse Health Advisor in one year.  Preventive Care 72 Years and Older, Male Preventive care refers to lifestyle choices and visits with your health care provider that can promote health and wellness. What does preventive care include?  A yearly physical exam. This is also called an annual well check.  Dental exams once or twice a year.  Routine eye exams. Ask your health care provider how often you should have your eyes checked.  Personal lifestyle choices, including:  Daily care of your teeth and gums.  Regular physical activity.  Eating a healthy diet.  Avoiding tobacco and drug use.  Limiting alcohol use.  Practicing safe sex.  Taking low  doses of aspirin every day if recommended by your health care provider..  Taking vitamin and mineral supplements as recommended by your health care provider. What happens during an annual well check? The services and screenings done by your health care provider during your annual well check will depend on your age, overall health, lifestyle risk factors, and family history of disease. Counseling  Your health care provider may ask you questions about your:  Alcohol use.  Tobacco use.  Drug use.  Emotional well-being.  Home and relationship well-being.  Sexual activity.  Eating habits.  History of falls.  Memory and ability to understand (cognition).  Work and work Statistician. Screening  You may have the following tests or measurements:  Height, weight, and BMI.  Blood pressure.  Lipid and cholesterol levels. These may be checked every 5 years, or more frequently if you are over 70 years old.  Skin check.  Lung cancer screening. You may have this screening every year starting at age 50 if you have a 30-pack-year history of smoking and currently smoke or have quit within the past 15 years.  Fecal occult blood test (FOBT) of the stool. You may have this test every year starting at age 72.  Flexible sigmoidoscopy or colonoscopy. You may have a sigmoidoscopy every 5 years or a colonoscopy every 10 years starting at age 92.  Prostate cancer screening. Recommendations will vary depending on your family history and other risks.  Hepatitis C blood test.  Hepatitis B blood test.  Sexually transmitted disease (STD) testing.  Diabetes screening. This is done by checking your blood sugar (glucose) after you have not  eaten for a while (fasting). You may have this done every 1-3 years.  Abdominal aortic aneurysm (AAA) screening. You may need this if you are a current or former smoker.  Osteoporosis. You may be screened starting at age 27 if you are at high risk. Talk with  your health care provider about your test results, treatment options, and if necessary, the need for more tests. Vaccines  Your health care provider may recommend certain vaccines, such as:  Influenza vaccine. This is recommended every year.  Tetanus, diphtheria, and acellular pertussis (Tdap, Td) vaccine. You may need a Td booster every 10 years.  Zoster vaccine. You may need this after age 72.  Pneumococcal 13-valent conjugate (PCV13) vaccine. One dose is recommended after age 72.  Pneumococcal polysaccharide (PPSV23) vaccine. One dose is recommended after age 72. Talk to your health care provider about which screenings and vaccines you need and how often you need them. This information is not intended to replace advice given to you by your health care provider. Make sure you discuss any questions you have with your health care provider. Document Released: 10/15/2015 Document Revised: 06/07/2016 Document Reviewed: 07/20/2015 Elsevier Interactive Patient Education  [address] Prevention in the Home Falls can cause injuries. They can happen to people of all ages. There are many things you can do to make your home safe and to help prevent falls. What can I do on the outside of my home?  Regularly fix the edges of walkways and driveways and fix any cracks.  Remove anything that might make you trip as you walk through a door, such as a raised step or threshold.  Trim any bushes or trees on the path to your home.  Use bright outdoor lighting.  Clear any walking paths of anything that might make someone trip, such as rocks or tools.  Regularly check to see if handrails are loose or broken. Make sure that both sides of any steps have handrails.  Any raised decks and porches should have guardrails on the edges.  Have any leaves, snow, or ice cleared regularly.  Use sand or salt on walking paths during winter.  Clean up any spills in your garage right away. This  includes oil or grease spills. What can I do in the bathroom?  Use night lights.  Install grab bars by the toilet and in the tub and shower. Do not use towel bars as grab bars.  Use non-skid mats or decals in the tub or shower.  If you need to sit down in the shower, use a plastic, non-slip stool.  Keep the floor dry. Clean up any water that spills on the floor as soon as it happens.  Remove soap buildup in the tub or shower regularly.  Attach bath mats securely with double-sided non-slip rug tape.  Do not have throw rugs and other things on the floor that can make you trip. What can I do in the bedroom?  Use night lights.  Make sure that you have a light by your bed that is easy to reach.  Do not use any sheets or blankets that are too big for your bed. They should not hang down onto the floor.  Have a firm chair that has side arms. You can use this for support while you get dressed.  Do not have throw rugs and other things on the floor that can make you trip. What can I do in the kitchen?  Clean up any spills  right away.  Avoid walking on wet floors.  Keep items that you use a lot in easy-to-reach places.  If you need to reach something above you, use a strong step stool that has a grab bar.  Keep electrical cords out of the way.  Do not use floor polish or wax that makes floors slippery. If you must use wax, use non-skid floor wax.  Do not have throw rugs and other things on the floor that can make you trip. What can I do with my stairs?  Do not leave any items on the stairs.  Make sure that there are handrails on both sides of the stairs and use them. Fix handrails that are broken or loose. Make sure that handrails are as long as the stairways.  Check any carpeting to make sure that it is firmly attached to the stairs. Fix any carpet that is loose or worn.  Avoid having throw rugs at the top or bottom of the stairs. If you do have throw rugs, attach them to the  floor with carpet tape.  Make sure that you have a light switch at the top of the stairs and the bottom of the stairs. If you do not have them, ask someone to add them for you. What else can I do to help prevent falls?  Wear shoes that:  Do not have high heels.  Have rubber bottoms.  Are comfortable and fit you well.  Are closed at the toe. Do not wear sandals.  If you use a stepladder:  Make sure that it is fully opened. Do not climb a closed stepladder.  Make sure that both sides of the stepladder are locked into place.  Ask someone to hold it for you, if possible.  Clearly mark and make sure that you can see:  Any grab bars or handrails.  First and last steps.  Where the edge of each step is.  Use tools that help you move around (mobility aids) if they are needed. These include:  Canes.  Walkers.  Scooters.  Crutches.  Turn on the lights when you go into a dark area. Replace any light bulbs as soon as they burn out.  Set up your furniture so you have a clear path. Avoid moving your furniture around.  If any of your floors are uneven, fix them.  If there are any pets around you, be aware of where they are.  Review your medicines with your doctor. Some medicines can make you feel dizzy. This can increase your chance of falling. Ask your doctor what other things that you can do to help prevent falls. This information is not intended to replace advice given to you by your health care provider. Make sure you discuss any questions you have with your health care provider. Document Released: 07/15/2009 Document Revised: 02/24/2016 Document Reviewed: 10/23/2014 Elsevier Interactive Patient Education  2017 [employer].

## 2019-08-25 NOTE — Progress Notes (Addendum)
Subjective:   Nicholas Maxwell is a 73 y.o. male who presents for an Initial Medicare Annual Wellness Visit.  Review of Systems   Cardiac Risk Factors include: advanced age (>77men, >18 women);hypertension;male gender;diabetes mellitus;dyslipidemia   Objective:    Today's Vitals   08/25/19 0936  BP: 108/62  Pulse: (!) 56  Temp: 98 F (36.7 C)  SpO2: 98%  Weight: 205 lb 9.6 oz (93.3 kg)  Height: 5\' 10"  (1.778 m)   Body mass index is 29.5 kg/m.  Advanced Directives 08/25/2019 08/21/2017 05/24/2017  Does Patient Have a Medical Advance Directive? Yes No Yes  Type of Advance Directive Living will;Healthcare Power of [place]  Does patient want to make changes to medical advance directive? No - Patient declined - No - Patient declined  Copy of [place] in Chart? No - copy requested - No - copy requested  Would patient like information on creating a medical advance directive? - No - Patient declined -    Current Medications (verified) Outpatient Encounter Medications as of 08/25/2019  Medication Sig  . ACCU-CHEK SOFTCLIX LANCETS lancets USE TO CHECK BLOOD SUGAR TWICE DAILY  . alfuzosin (UROXATRAL) 10 MG 24 hr tablet Take 10 mg by mouth daily with supper.  Marland Kitchen allopurinol (ZYLOPRIM) 300 MG tablet TAKE 1 TABLET (300 MG TOTAL) BY MOUTH DAILY WITH LUNCH.  Marland Kitchen aspirin EC 81 MG tablet Take 81 mg by mouth daily with breakfast.  . finasteride (PROSCAR) 5 MG tablet Take 5 mg by mouth daily.  Marland Kitchen glucose blood (ACCU-CHEK AVIVA) test strip Use to check blood sugar twice daily  . metoprolol succinate (TOPROL-XL) 25 MG 24 hr tablet TAKE 1 TABLET (25 MG TOTAL) BY MOUTH DAILY. 1 TABLET EVERY OTHER DAY  . nitroGLYCERIN (NITROSTAT) 0.4 MG SL tablet Place 0.4 mg under the tongue every 5 (five) minutes x 3 doses as needed. For chest pain.  . ramipril (ALTACE) 5 MG capsule TAKE 1 CAPSULE BY MOUTH EVERY DAY  . ranolazine (RANEXA) 500 MG 12 hr tablet  TAKE 2 TABLETS BY MOUTH TWICE A DAY  . REPATHA SURECLICK [id number] MG/ML SOAJ INJECT 1ML EVERY 2 WEEKS  . [DISCONTINUED] Glucosamine-Chondroitin 750-600 MG TABS Take by mouth.   No facility-administered encounter medications on file as of 08/25/2019.     Allergies (verified) Crestor [rosuvastatin calcium] and Statins   History: Past Medical History:  Diagnosis Date  . History of kidney stones   . Myocardial infarction ([place]) 04/2017  . Statin intolerance    Used >2 different statins   Past Surgical History:  Procedure Laterality Date  . CORONARY ANGIOPLASTY WITH STENT PLACEMENT  05/24/2017  . CORONARY STENT INTERVENTION N/A 05/24/2017   Procedure: CORONARY STENT INTERVENTION;  Surgeon: Nigel Mormon, MD;  Location: [place] [facility];  Service: Cardiovascular;  Laterality: N/A;  MID Circumflex 4.0x22 Onyx MID LAD 2.75x15 Onyx  . CYSTOSCOPY WITH URETEROSCOPY, STONE BASKETRY AND STENT PLACEMENT  ~ 1990  . HERNIA REPAIR    . INTRAVASCULAR PRESSURE WIRE/FFR STUDY N/A 05/24/2017   Procedure: INTRAVASCULAR PRESSURE WIRE/FFR STUDY;  Surgeon: Nigel Mormon, MD;  Location: [place] [facility];  Service: Cardiovascular;  Laterality: N/A;  MID LAD  . KNEE ARTHROSCOPY Right 2016  . LEFT HEART CATH AND CORONARY ANGIOGRAPHY N/A 05/24/2017   Procedure: LEFT HEART CATH AND CORONARY ANGIOGRAPHY;  Surgeon: Nigel Mormon, MD;  Location: [facility];  Service: Cardiovascular;  Laterality: N/A;  . UMBILICAL  HERNIA REPAIR  1992   Family History  Problem Relation Age of Onset  . Transient ischemic attack Father 30  . Bone cancer Father   . CAD Mother 18       CABG   Social History   Socioeconomic History  . Marital status: Married    Spouse name: Not on file  . Number of children: 2  . Years of education: Not on file  . Highest education level: Not on file  Occupational History  . Not on file  Social Needs  . Financial resource strain: Not on file  . Food insecurity     Worry: Not on file    Inability: Not on file  . Transportation needs    Medical: Not on file    Non-medical: Not on file  Tobacco Use  . Smoking status: Former Smoker    Packs/day: 1.00    Years: 30.00    Pack years: 30.00    Types: Cigarettes    Quit date: 2003    Years since quitting: 17.9  . Smokeless tobacco: Never Used  Substance and Sexual Activity  . Alcohol use: No  . Drug use: No  . Sexual activity: Not Currently  Lifestyle  . Physical activity    Days per week: Not on file    Minutes per session: Not on file  . Stress: Not on file  Relationships  . Social Herbalist on phone: Not on file    Gets together: Not on file    Attends religious service: Not on file    Active member of club or organization: Not on file    Attends meetings of clubs or organizations: Not on file    Relationship status: Not on file  Other Topics Concern  . Not on file  Social History Narrative   Married   Son and daughter   Lynnette Caffey   Worked in [place] given: Not Answered   Clinical Intake:  Pre-visit preparation completed: Yes  Pain : No/denies pain  Diabetes: Yes CBG done?: No Did pt. bring in CBG monitor from home?: No  How often do you need to have someone help you when you read instructions, pamphlets, or other written materials from your doctor or pharmacy?: 1 - Never  Interpreter Needed?: No  Information entered by :: Denman George LPN  Activities of Daily Living In your present state of health, do you have any difficulty performing the following activities: 08/25/2019  Hearing? N  Vision? N  Difficulty concentrating or making decisions? N  Walking or climbing stairs? N  Dressing or bathing? N  Doing errands, shopping? N  Preparing Food and eating ? N  Using the Toilet? N  In the past six months, have you accidently leaked urine? N  Do you have problems with loss of bowel control? N  Managing your  Medications? N  Managing your Finances? N  Housekeeping or managing your Housekeeping? N  Some recent data might be hidden     Immunizations and Health Maintenance Immunization History  Administered Date(s) Administered  . Fluad Quad(high Dose 65+) 05/28/2019  . Influenza, High Dose Seasonal PF 07/10/2016, 06/27/2017, 06/11/2018  . Influenza-Unspecified 07/05/2010, 07/17/2011, 07/08/2012, 06/24/2013, 07/21/2014, 06/28/2015  . Pneumococcal Conjugate-13 11/09/2016  . Pneumococcal Polysaccharide-23 10/07/2012  . Tdap 02/22/2018  . Zoster 05/16/2013   Health Maintenance Due  Topic Date Due  . OPHTHALMOLOGY EXAM  02/20/1956    Patient Care Team: [name], [place]  as PCP - General (Physician Assistant) [name], MD as Attending Physician (Urology) Thalia Bloodgood, [place] as Referring Physician (Optometry) Newell Coral., MD as Referring Physician (Gastroenterology) Nigel Mormon, MD as Consulting Physician (Cardiology) Lenard Simmer, [facility] Of (Gastroenterology) Paralee Cancel, MD as Consulting Physician (Orthopedic Surgery)  Indicate any recent Medical Services you may have received from other than [facility] providers in the past year (date may be approximate).    Assessment:   This is a routine wellness examination for [place].  Hearing/Vision screen No exam data present  Dietary issues and exercise activities discussed: Current Exercise Habits: Home exercise routine, Type of exercise: Other - see comments;walking(yardwork/outside activities), Time (Minutes): 30, Frequency (Times/Week): 5, Weekly Exercise (Minutes/Week): 150, Intensity: Mild  Goals    . DIET - EAT MORE FRUITS AND VEGETABLES      Depression Screen PHQ 2/9 Scores 08/25/2019 05/28/2019 12/14/2017 08/31/2017  PHQ - 2 Score 0 0 0 0    Fall Risk Fall Risk  08/25/2019 08/21/2017 08/21/2017 08/21/2017  Falls in the past year? 0 No No No  Injury with Fall? 0 - - -  Follow up Falls evaluation  completed;Education provided;Falls prevention discussed - - -    Is the patient's home free of loose throw rugs in walkways, pet beds, electrical cords, etc?   yes      Grab bars in the bathroom? yes      Handrails on the stairs?   yes      Adequate lighting?   yes  Timed Get Up and Go performed: completed and within normal timeframe; no gait abnormalities noted   Cognitive Function: no cognitive concerns at this time      6CIT Screen 08/25/2019  What Year? 0 points  What month? 0 points  What time? 0 points  Count back from 20 0 points  Months in reverse 0 points  Repeat phrase 0 points  Total Score 0    Screening Tests Health Maintenance  Topic Date Due  . OPHTHALMOLOGY EXAM  02/20/1956  . FOOT EXAM  11/21/2019  . HEMOGLOBIN A1C  11/28/2019  . COLONOSCOPY  12/05/2021  . TETANUS/TDAP  02/23/2028  . INFLUENZA VACCINE  Completed  . Hepatitis C Screening  Completed  . PNA vac Low Risk Adult  Completed    Qualifies for Shingles Vaccine? Discussed and patient will check with pharmacy for coverage.  Patient education handout provided   Cancer Screenings: Lung: Low Dose CT Chest recommended if Age 46-80 years, 30 pack-year currently smoking OR have quit w/in 15years. Patient does not qualify. Colorectal: colonoscopy 12/05/16 with Dr. Mathews Robinsons      Plan:  I have personally reviewed and addressed the Medicare Annual Wellness questionnaire and have noted the following in the patient's chart:  A. Medical and social history B. Use of alcohol, tobacco or illicit drugs  C. Current medications and supplements D. Functional ability and status E.  Nutritional status F.  Physical activity G. Advance directives H. List of other physicians I.  Hospitalizations, surgeries, and ER visits in previous 12 months J.  [place] such as hearing and vision if needed, cognitive and depression L. Referrals, records requested, and appointments- will request last diabetic eye exam  notes   In addition, I have reviewed and discussed with patient certain preventive protocols, quality metrics, and best practice recommendations. A written personalized care plan for preventive services as well as general preventive health recommendations were provided to patient.   Signed,  Denman George,  LPN  Nurse Health Advisor   Nurse Notes: Will check to see if there are any programs to assist patient with the cost of Ranexa and Repatha. Will update patient at his upcoming appointment with pcp.   I have reviewed documentation for AWV and Advance Care planning provided by [facility], I agree with documentation, I was immediately available for any questions. [name], [place]

## 2019-08-27 ENCOUNTER — Other Ambulatory Visit: Payer: Self-pay

## 2019-09-01 ENCOUNTER — Other Ambulatory Visit: Payer: Self-pay

## 2019-09-01 ENCOUNTER — Other Ambulatory Visit: Payer: Self-pay | Admitting: Cardiology

## 2019-09-01 ENCOUNTER — Ambulatory Visit (INDEPENDENT_AMBULATORY_CARE_PROVIDER_SITE_OTHER): Payer: PPO | Admitting: Physician Assistant

## 2019-09-01 ENCOUNTER — Encounter: Payer: Self-pay | Admitting: Physician Assistant

## 2019-09-01 VITALS — BP 102/60 | HR 56 | Temp 97.3°F | Ht 70.0 in | Wt 204.0 lb

## 2019-09-01 DIAGNOSIS — N1831 Chronic kidney disease, stage 3a: Secondary | ICD-10-CM | POA: Diagnosis not present

## 2019-09-01 DIAGNOSIS — E119 Type 2 diabetes mellitus without complications: Secondary | ICD-10-CM

## 2019-09-01 LAB — BASIC METABOLIC PANEL
BUN: 25 mg/dL — ABNORMAL HIGH (ref 6–23)
CO2: 26 mEq/L (ref 19–32)
Calcium: 9.5 mg/dL (ref 8.4–10.5)
Chloride: 101 mEq/L (ref 96–112)
Creatinine, Ser: 1.4 mg/dL (ref 0.40–1.50)
GFR: 49.74 mL/min — ABNORMAL LOW (ref >60.00)
Glucose, Bld: 142 mg/dL — ABNORMAL HIGH (ref 70–99)
Potassium: 5 mEq/L (ref 3.5–5.1)
Sodium: 135 mEq/L (ref 135–145)

## 2019-09-01 LAB — HEMOGLOBIN A1C: Hgb A1c MFr Bld: 6.3 % (ref 4.6–6.5)

## 2019-09-01 NOTE — Progress Notes (Signed)
Nicholas Maxwell is a 73 y.o. male is here to follow up on diabetes, and hypertension.  I acted as a Education administrator for [employer], PA-C Anselmo Pickler, LPN  History of Present Illness:   Chief Complaint  Patient presents with  . Diabetes  . Hypertension    Diabetes He presents for his follow-up diabetic visit. He has type 2 diabetes mellitus. His disease course has been stable. There are no diabetic associated symptoms. There are no hypoglycemic complications. Symptoms are stable. Diabetic complications include heart disease. Risk factors for coronary artery disease include diabetes mellitus, dyslipidemia, male sex and hypertension. When asked about current treatments, none were reported. He is compliant with treatment all of the time. His weight is stable. He is following a low fat/cholesterol diet. Meal planning includes avoidance of concentrated sweets. Exercise: Twice a week goes to the Gym. (Pt checking blood sugars 1-2 x's a month but does check them twice on that day. Blood sugar this morning was 135.) An ACE inhibitor/angiotensin II receptor blocker is contraindicated. He does not see a podiatrist.Eye exam is current.    CKD He denies nausea, back pain, vomiting, discolored urine. States that he was taking an OTC supplement for his joints and it was causing some unusual symptoms but when he stopped this medication his symptoms were gone. Isn't really able to tell me what symptoms he was having however.   Health Maintenance Due  Topic Date Due  . OPHTHALMOLOGY EXAM  02/20/1956    Past Medical History:  Diagnosis Date  . History of kidney stones   . Myocardial infarction ([place]) 04/2017  . Statin intolerance    Used >2 different statins     Social History   Socioeconomic History  . Marital status: Married    Spouse name: Not on file  . Number of children: 2  . Years of education: Not on file  . Highest education level: Not on file  Occupational History  . Not on file   Social Needs  . Financial resource strain: Not on file  . Food insecurity    Worry: Not on file    Inability: Not on file  . Transportation needs    Medical: Not on file    Non-medical: Not on file  Tobacco Use  . Smoking status: Former Smoker    Packs/day: 1.00    Years: 30.00    Pack years: 30.00    Types: Cigarettes    Quit date: 2003    Years since quitting: 17.9  . Smokeless tobacco: Never Used  Substance and Sexual Activity  . Alcohol use: No  . Drug use: No  . Sexual activity: Not Currently  Lifestyle  . Physical activity    Days per week: Not on file    Minutes per session: Not on file  . Stress: Not on file  Relationships  . Social Herbalist on phone: Not on file    Gets together: Not on file    Attends religious service: Not on file    Active member of club or organization: Not on file    Attends meetings of clubs or organizations: Not on file    Relationship status: Not on file  . Intimate partner violence    Fear of current or ex partner: Not on file    Emotionally abused: Not on file    Physically abused: Not on file    Forced sexual activity: Not on file  Other Topics Concern  .  Not on file  Social History Narrative   Married   Son and daughter   Lynnette Caffey   Worked in Research officer, trade [employer]    Past Surgical History:  Procedure Laterality Date  . CORONARY ANGIOPLASTY WITH STENT PLACEMENT  05/24/2017  . CORONARY STENT INTERVENTION N/A 05/24/2017   Procedure: CORONARY STENT INTERVENTION;  Surgeon: Nigel Mormon, MD;  Location: [place] [facility];  Service: Cardiovascular;  Laterality: N/A;  MID Circumflex 4.0x22 Onyx MID LAD 2.75x15 Onyx  . CYSTOSCOPY WITH URETEROSCOPY, STONE BASKETRY AND STENT PLACEMENT  ~ 1990  . HERNIA REPAIR    . INTRAVASCULAR PRESSURE WIRE/FFR STUDY N/A 05/24/2017   Procedure: INTRAVASCULAR PRESSURE WIRE/FFR STUDY;  Surgeon: Nigel Mormon, MD;  Location: [place] [facility];  Service: Cardiovascular;   Laterality: N/A;  MID LAD  . KNEE ARTHROSCOPY Right 2016  . LEFT HEART CATH AND CORONARY ANGIOGRAPHY N/A 05/24/2017   Procedure: LEFT HEART CATH AND CORONARY ANGIOGRAPHY;  Surgeon: Nigel Mormon, MD;  Location: [place] [facility];  Service: Cardiovascular;  Laterality: N/A;  . UMBILICAL HERNIA REPAIR  1992    Family History  Problem Relation Age of Onset  . Transient ischemic attack Father 34  . Bone cancer Father   . CAD Mother 32       CABG    PMHx, SurgHx, SocialHx, FamHx, Medications, and Allergies were reviewed in the Visit Navigator and updated as appropriate.   Patient Active Problem List   Diagnosis Date Noted  . Statin intolerance 11/21/2018  . 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 History of kidney stones 02/26/2018  . Osteoarthritis of right knee 02/26/2018  . Post PTCA 05/24/2017  . Myocardial infarction ([place]) 04/2017  . Polyp of colon, adenomatous 12/08/2016  . Arthritis of carpometacarpal [facility]) joint of left thumb 11/26/2016  . Essential hypertension 05/12/2015  . 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 Hypertrophic and atrophic condition of skin 05/12/2015  . Hyperlipidemia 05/12/2015  . Other seborrheic keratosis 05/12/2015  . Type 2 diabetes mellitus without complication, without long-term current use of insulin ([place]) 05/12/2015  . Hypertrophy of prostate without urinary obstruction and other lower urinary tract symptoms (LUTS) 04/08/2013    Social History   Tobacco Use  . Smoking status: Former Smoker    Packs/day: 1.00    Years: 30.00    Pack years: 30.00    Types: Cigarettes    Quit date: 2003    Years since quitting: 17.9  . Smokeless tobacco: Never Used  Substance Use Topics  . Alcohol use: No  . Drug use: No    Current Medications and Allergies:    Current Outpatient Medications:  .  ACCU-CHEK SOFTCLIX LANCETS lancets, USE TO CHECK BLOOD SUGAR TWICE DAILY, Disp: , Rfl:  .  alfuzosin (UROXATRAL) 10 MG 24 hr tablet,  Take 10 mg by mouth daily with supper., Disp: , Rfl:  .  allopurinol (ZYLOPRIM) 300 MG tablet, TAKE 1 TABLET (300 MG TOTAL) BY MOUTH DAILY WITH LUNCH., Disp: 90 tablet, Rfl: 0 .  aspirin EC 81 MG tablet, Take 81 mg by mouth daily with breakfast., Disp: , Rfl:  .  finasteride (PROSCAR) 5 MG tablet, Take 5 mg by mouth daily., Disp: , Rfl:  .  glucose blood (ACCU-CHEK AVIVA) test strip, Use to check blood sugar twice daily, Disp: , Rfl:  .  metoprolol succinate (TOPROL-XL) 25 MG 24 hr tablet, Take 25 mg by mouth daily., Disp: , Rfl:  .  nitroGLYCERIN (NITROSTAT) 0.4 MG SL  tablet, Place 0.4 mg under the tongue every 5 (five) minutes x 3 doses as needed. For chest pain., Disp: , Rfl:  .  ramipril (ALTACE) 5 MG capsule, TAKE 1 CAPSULE BY MOUTH EVERY DAY, Disp: 90 capsule, Rfl: 1 .  ranolazine (RANEXA) 500 MG 12 hr tablet, TAKE 2 TABLETS BY MOUTH TWICE A DAY, Disp: 360 tablet, Rfl: 1 .  REPATHA SURECLICK [id number] MG/ML SOAJ, INJECT 1ML EVERY 2 WEEKS, Disp: 8 pen, Rfl: 14   Allergies  Allergen Reactions  . Crestor [Rosuvastatin Calcium] Anaphylaxis and Swelling    Tongue swells/throat closes  . Statins Other (See Comments)    Muscle/joint pain with ALL other statin drug but Crestor is severe    Review of Systems   ROS Negative unless otherwise specified per HPI.  Vitals:   Vitals:   09/01/19 0934  BP: 102/60  Pulse: (!) 56  Temp: (!) 97.3 F (36.3 C)  TempSrc: Temporal  SpO2: 96%  Weight: 204 lb (92.5 kg)  Height: 5\' 10"  (1.778 m)     Body mass index is 29.27 kg/m.   Physical Exam:   Physical Exam Vitals signs and nursing note reviewed.  Constitutional:      General: He is not in acute distress.    Appearance: He is well-developed. He is not ill-appearing or toxic-appearing.  Cardiovascular:     Rate and Rhythm: Regular rhythm. Bradycardia present.     Pulses: Normal pulses.     Heart sounds: Normal heart sounds, S1 normal and S2 normal.     Comments: No LE edema Pulmonary:      Effort: Pulmonary effort is normal.     Breath sounds: Normal breath sounds.  Skin:    General: Skin is warm and dry.  Neurological:     Mental Status: He is alert.     GCS: GCS eye subscore is 4. GCS verbal subscore is 5. GCS motor subscore is 6.  Psychiatric:        Speech: Speech normal.        Behavior: Behavior normal. Behavior is cooperative.     Assessment and Plan:    Nicholas Maxwell was seen today for diabetes and hypertension.  Diagnoses and all orders for this visit:  Type 2 diabetes mellitus without complication, without long-term current use of insulin (HCC) No red flags. Will update HgbA1c. Goal is <7%, currently not on any medications.  -     Hemoglobin A1c  Stage 3a chronic kidney disease No red flags, will update BMP. -     Basic metabolic panel  . Reviewed expectations re: course of current medical issues. . Discussed self-management of symptoms. . Outlined signs and symptoms indicating need for more acute intervention. . Patient verbalized understanding and all questions were answered. . See orders for this visit as documented in the electronic medical record. . Patient received an After Visit Summary.  CMA or LPN served as scribe during this visit. History, Physical, and Plan performed by medical provider. The above documentation has been reviewed and is accurate and complete.   [name], PA-C [place], [place] 09/01/2019  Follow-up: Return in about 6 months (around 02/29/2020).

## 2019-09-01 NOTE — Patient Instructions (Signed)
It was great to see you!  I will be in touch via MyChart once your labs have returned.  Let's follow-up in 6 months, sooner if you have concerns.  Take care,  [name] PA-C

## 2019-09-03 DIAGNOSIS — N200 Calculus of kidney: Secondary | ICD-10-CM | POA: Diagnosis not present

## 2019-09-03 DIAGNOSIS — N401 Enlarged prostate with lower urinary tract symptoms: Secondary | ICD-10-CM | POA: Diagnosis not present

## 2019-09-03 DIAGNOSIS — R351 Nocturia: Secondary | ICD-10-CM | POA: Diagnosis not present

## 2019-09-07 DIAGNOSIS — U071 COVID-19: Secondary | ICD-10-CM | POA: Insufficient documentation

## 2019-09-11 ENCOUNTER — Encounter: Payer: Self-pay | Admitting: Physician Assistant

## 2019-09-11 NOTE — Telephone Encounter (Signed)
Please get me his pre-op form

## 2019-09-12 ENCOUNTER — Encounter: Payer: Self-pay | Admitting: Physician Assistant

## 2019-09-12 ENCOUNTER — Other Ambulatory Visit: Payer: Self-pay | Admitting: Physician Assistant

## 2019-09-12 ENCOUNTER — Other Ambulatory Visit: Payer: Self-pay

## 2019-09-12 ENCOUNTER — Ambulatory Visit (INDEPENDENT_AMBULATORY_CARE_PROVIDER_SITE_OTHER): Payer: PPO | Admitting: Physician Assistant

## 2019-09-12 VITALS — Temp 98.8°F | Ht 70.0 in | Wt 195.0 lb

## 2019-09-12 DIAGNOSIS — N1831 Chronic kidney disease, stage 3a: Secondary | ICD-10-CM

## 2019-09-12 DIAGNOSIS — Z20822 Contact with and (suspected) exposure to covid-19: Secondary | ICD-10-CM

## 2019-09-12 DIAGNOSIS — Z20828 Contact with and (suspected) exposure to other viral communicable diseases: Secondary | ICD-10-CM

## 2019-09-12 DIAGNOSIS — Z419 Encounter for procedure for purposes other than remedying health state, unspecified: Secondary | ICD-10-CM

## 2019-09-12 MED ORDER — AZITHROMYCIN 250 MG PO TABS: ORAL_TABLET | ORAL | 0 refills | Status: DC

## 2019-09-12 MED ORDER — BENZONATATE 100 MG PO CAPS: 100.0000 mg | ORAL_CAPSULE | Freq: Three times a day (TID) | ORAL | 1 refills | Status: DC | PRN

## 2019-09-12 NOTE — Progress Notes (Signed)
Virtual Visit via Video   I connected with Nicholas Maxwell on 09/12/19 at  1:20 PM EST by a video enabled telemedicine application and verified that I am speaking with the correct person using two identifiers. Location patient: Home Location provider: [facility], Office Persons participating in the virtual visit: [place], Sachar PA-C, Nicholas Pickler, LPN   I discussed the limitations of evaluation and management by telemedicine and the availability of in person appointments. The patient expressed understanding and agreed to proceed.  I acted as a Education administrator for [employer],  PA-C [employer], LPN  Subjective:   HPI:   Patient is requesting evaluation for possible COVID-19.  Symptom onset: Sunday afternoon  Travel/contacts: no  Patient endorses the following symptoms: sinus congestion, rhinorrhea, sore throat, productive cough (clear sputum), shortness of breath and myalgia  Patient denies the following symptoms: Fever (none), ear pain, difficulty swallowing, chest tightness and chest pain  Treatments tried: Airborne, Tylenol Cold and Flu, Azelastine  Some mild diarrhea. SOB is not severe and is able to speak in full sentences and walk around house without feeling winded. Denies any difficulty eating or drinking.  Patient risk factors:  Current XX123456 risk of complications score: 6 Smoking status: Nicholas Maxwell  reports that he quit smoking about 17 years ago. His smoking use included cigarettes. He has a 30.00 pack-year smoking history. He has never used smokeless tobacco. If male, currently pregnant? []   Yes [x]   No  ROS: See pertinent positives and negatives per HPI.  Patient Active Problem List   Diagnosis Date Noted  . Statin intolerance 11/21/2018  . 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 History of kidney stones 02/26/2018  . Osteoarthritis of right knee 02/26/2018  . Post PTCA 05/24/2017  . Myocardial  infarction ([place]) 04/2017  . Polyp of colon, adenomatous 12/08/2016  . Arthritis of carpometacarpal [facility]) joint of left thumb 11/26/2016  . Essential hypertension 05/12/2015  . 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 Hypertrophic and atrophic condition of skin 05/12/2015  . Hyperlipidemia 05/12/2015  . Other seborrheic keratosis 05/12/2015  . Type 2 diabetes mellitus without complication, without long-term current use of insulin ([place]) 05/12/2015  . Hypertrophy of prostate without urinary obstruction and other lower urinary tract symptoms (LUTS) 04/08/2013    Social History   Tobacco Use  . Smoking status: Former Smoker    Packs/day: 1.00    Years: 30.00    Pack years: 30.00    Types: Cigarettes    Quit date: 2003    Years since quitting: 17.9  . Smokeless tobacco: Never Used  Substance Use Topics  . Alcohol use: No    Current Outpatient Medications:  .  ACCU-CHEK SOFTCLIX LANCETS lancets, USE TO CHECK BLOOD SUGAR TWICE DAILY, Disp: , Rfl:  .  alfuzosin (UROXATRAL) 10 MG 24 hr tablet, Take 10 mg by mouth daily with supper., Disp: , Rfl:  .  allopurinol (ZYLOPRIM) 300 MG tablet, TAKE 1 TABLET (300 MG TOTAL) BY MOUTH DAILY WITH LUNCH., Disp: 90 tablet, Rfl: 0 .  aspirin EC 81 MG tablet, Take 81 mg by mouth daily with breakfast., Disp: , Rfl:  .  finasteride (PROSCAR) 5 MG tablet, Take 5 mg by mouth daily., Disp: , Rfl:  .  glucose blood (ACCU-CHEK AVIVA) test strip, Use to check blood sugar twice daily, Disp: , Rfl:  .  metoprolol succinate (TOPROL-XL) 25 MG 24 hr tablet, Take 25 mg by mouth daily., Disp: , Rfl:  .  nitroGLYCERIN (NITROSTAT) 0.4 MG SL tablet, Place 0.4 mg under the tongue every 5 (five) minutes x 3 doses as needed. For chest pain., Disp: , Rfl:  .  ramipril (ALTACE) 5 MG capsule, TAKE 1 CAPSULE BY MOUTH EVERY DAY, Disp: 90 capsule, Rfl: 1 .  ranolazine (RANEXA) 500 MG 12 hr tablet, TAKE 2 TABLETS BY MOUTH TWICE A DAY, Disp: 360 tablet, Rfl: 1 .  REPATHA SURECLICK [id number] MG/ML  SOAJ, INJECT 1ML EVERY 2 WEEKS, Disp: 8 pen, Rfl: 14 .  azithromycin (ZITHROMAX) 250 MG tablet, Take two tabs daily, then one tab daily x 4 days, Disp: 6 tablet, Rfl: 0 .  benzonatate (TESSALON PERLES) 100 MG capsule, Take 1 capsule (100 mg total) by mouth 3 (three) times daily as needed for cough., Disp: 30 capsule, Rfl: 1  Allergies  Allergen Reactions  . Crestor [Rosuvastatin Calcium] Anaphylaxis and Swelling    Tongue swells/throat closes  . Statins Other (See Comments)    Muscle/joint pain with ALL other statin drug but Crestor is severe    Objective:   VITALS: Per patient if applicable, see vitals. GENERAL: Alert, appears well and in no acute distress. HEENT: Atraumatic, conjunctiva clear, no obvious abnormalities on inspection of external nose and ears. NECK: Normal movements of the head and neck. CARDIOPULMONARY: No increased WOB. Speaking in clear sentences. I:E ratio WNL.  MS: Moves all visible extremities without noticeable abnormality. PSYCH: Pleasant and cooperative, well-groomed. Speech normal rate and rhythm. Affect is appropriate. Insight and judgement are appropriate. Attention is focused, linear, and appropriate.  NEURO: CN grossly intact. Oriented as arrived to appointment on time with no prompting. Moves both UE equally.  SKIN: No obvious lesions, wounds, erythema, or cyanosis noted on face or hands.  Assessment and Plan:   Nicholas Maxwell was seen today for covid symptoms.  Diagnoses and all orders for this visit:  Suspected COVID-19 virus infection -     Novel Coronavirus, NAA ([facility])  Other orders -     azithromycin (ZITHROMAX) 250 MG tablet; Take two tabs daily, then one tab daily x 4 days -     benzonatate (TESSALON PERLES) 100 MG capsule; Take 1 capsule (100 mg total) by mouth 3 (three) times daily as needed for cough.   Patient has a respiratory illness without signs of acute distress or respiratory compromise at this time. This is likely a viral infection,  which can come from a number of respiratory viruses. We are going to send patient for drive-up testing. As a precaution, they have been advised to remain home until COVID-19 results and then possible further quarantine after that based on results and symptoms. Advised if they experience a "second sickening" or worsening symptoms as the illness progresses, they are to call the office for further instructions or seek emergent evaluation for any severe symptoms.   Did give patient a pocket rx for azithromycin should his symptoms gradually worsen or do not improve after 7-10 days. Discussed with patient that due to his comorbidities, low threshold to go to the ER if his symptoms worsen. Patient verbalized understanding.  . Reviewed expectations re: course of current medical issues. . Discussed self-management of symptoms. . Outlined signs and symptoms indicating need for more acute intervention. . Patient verbalized understanding and all questions were answered. Marland Kitchen Health Maintenance issues including appropriate healthy diet, exercise, and smoking avoidance were discussed with patient. . See orders for this visit as documented in the electronic medical record.  I discussed the assessment and treatment plan  with the patient. The patient was provided an opportunity to ask questions and all were answered. The patient agreed with the plan and demonstrated an understanding of the instructions.   The patient was advised to call back or seek an in-person evaluation if the symptoms worsen or if the condition fails to improve as anticipated.   CMA or LPN served as scribe during this visit. History, Physical, and Plan performed by medical provider. The above documentation has been reviewed and is accurate and complete.  [place], [place] 09/12/2019

## 2019-09-14 LAB — NOVEL CORONAVIRUS, NAA: SARS-CoV-2, NAA: DETECTED — AB

## 2019-09-15 ENCOUNTER — Encounter: Payer: Self-pay | Admitting: Cardiology

## 2019-09-15 ENCOUNTER — Telehealth: Payer: Self-pay | Admitting: Physician Assistant

## 2019-09-15 ENCOUNTER — Telehealth: Payer: Self-pay | Admitting: Unknown Physician Specialty

## 2019-09-15 NOTE — Telephone Encounter (Signed)
Please advise.   Copied from [place] [id number]. Topic: General - Other >> Sep 15, 2019 12:14 PM Wynetta Emery, [place] C wrote: Reason for CRM: pt called in to be advised. Pt says that he was told by PCP to have covid test and then she would advise further, pt has tested positive and would like to know what to do next?    CB: [phone]-- please advise pt.

## 2019-09-15 NOTE — Telephone Encounter (Signed)
Spoke to pt asked him how can I help you? I am sure someone talked to you earlier today and told you test was positive for COVID. Pt said yes just wanted to know if there was anything else to take or do. Told pt all you can do is monitor your symptoms, keep hydrated and get plenty of rest. Asked pt how cough is? Pt said the same, expectorating frothy sputum and has pain sometimes when he takes a deep breath. Told pt to monitor symptoms any increase in SOB or pain with breathing, or fever please call the office. If symptoms get severe please go to the ED. Pt verbalized understanding. Told pt to finish antibiotic, continue cough syrup as needed, push fluids and rest. Pt verbalized understanding.

## 2019-09-15 NOTE — Telephone Encounter (Signed)
Discussed with patient about Covid symptoms and the use of bamlanivimab, a monoclonal antibody infusion for those with mild to moderate Covid symptoms and at a high risk of hospitalization.  Pt is not qualified for this infusion  due to symptoms greater than 10 days.

## 2019-09-17 ENCOUNTER — Encounter: Payer: Self-pay | Admitting: Cardiology

## 2019-09-19 ENCOUNTER — Emergency Department (HOSPITAL_COMMUNITY)
Admission: EM | Admit: 2019-09-19 | Discharge: 2019-09-19 | Disposition: A | Discharge status: Home / Self Care | Payer: PPO | Attending: Emergency Medicine | Admitting: Emergency Medicine

## 2019-09-19 ENCOUNTER — Emergency Department (HOSPITAL_COMMUNITY): Payer: PPO

## 2019-09-19 ENCOUNTER — Other Ambulatory Visit: Payer: Self-pay

## 2019-09-19 ENCOUNTER — Encounter (HOSPITAL_COMMUNITY): Payer: Self-pay | Admitting: Emergency Medicine

## 2019-09-19 DIAGNOSIS — E119 Type 2 diabetes mellitus without complications: Secondary | ICD-10-CM | POA: Insufficient documentation

## 2019-09-19 DIAGNOSIS — J189 Pneumonia, unspecified organism: Secondary | ICD-10-CM | POA: Insufficient documentation

## 2019-09-19 DIAGNOSIS — Z7982 Long term (current) use of aspirin: Secondary | ICD-10-CM | POA: Diagnosis not present

## 2019-09-19 DIAGNOSIS — Z87891 Personal history of nicotine dependence: Secondary | ICD-10-CM | POA: Diagnosis not present

## 2019-09-19 DIAGNOSIS — U071 COVID-19: Secondary | ICD-10-CM | POA: Diagnosis not present

## 2019-09-19 DIAGNOSIS — Z79899 Other long term (current) drug therapy: Secondary | ICD-10-CM | POA: Diagnosis not present

## 2019-09-19 DIAGNOSIS — I252 Old myocardial infarction: Secondary | ICD-10-CM | POA: Insufficient documentation

## 2019-09-19 DIAGNOSIS — R0602 Shortness of breath: Secondary | ICD-10-CM | POA: Diagnosis not present

## 2019-09-19 DIAGNOSIS — I10 Essential (primary) hypertension: Secondary | ICD-10-CM | POA: Insufficient documentation

## 2019-09-19 DIAGNOSIS — E860 Dehydration: Secondary | ICD-10-CM

## 2019-09-19 DIAGNOSIS — R05 Cough: Secondary | ICD-10-CM | POA: Diagnosis not present

## 2019-09-19 LAB — CBC
HCT: 31.6 % — ABNORMAL LOW (ref 39.0–52.0)
Hemoglobin: 10.6 g/dL — ABNORMAL LOW (ref 13.0–17.0)
MCH: 34.1 pg — ABNORMAL HIGH (ref 26.0–34.0)
MCHC: 33.5 g/dL (ref 30.0–36.0)
MCV: 101.6 fL — ABNORMAL HIGH (ref 80.0–100.0)
Platelets: 209 10*3/uL (ref 150–400)
RBC: 3.11 MIL/uL — ABNORMAL LOW (ref 4.22–5.81)
RDW: 11.8 % (ref 11.5–15.5)
WBC: 7.3 10*3/uL (ref 4.0–10.5)
nRBC: 0 % (ref 0.0–0.2)

## 2019-09-19 LAB — BASIC METABOLIC PANEL
Anion gap: 13 (ref 5–15)
BUN: 31 mg/dL — ABNORMAL HIGH (ref 8–23)
CO2: 22 mmol/L (ref 22–32)
Calcium: 8.9 mg/dL (ref 8.9–10.3)
Chloride: 100 mmol/L (ref 98–111)
Creatinine, Ser: 1.94 mg/dL — ABNORMAL HIGH (ref 0.61–1.24)
GFR calc Af Amer: 39 mL/min — ABNORMAL LOW (ref >60)
GFR calc non Af Amer: 34 mL/min — ABNORMAL LOW (ref >60)
Glucose, Bld: 150 mg/dL — ABNORMAL HIGH (ref 70–99)
Potassium: 4.5 mmol/L (ref 3.5–5.1)
Sodium: 135 mmol/L (ref 135–145)

## 2019-09-19 LAB — TROPONIN I (HIGH SENSITIVITY)
Troponin I (High Sensitivity): 5 ng/L (ref <18)
Troponin I (High Sensitivity): 5 ng/L (ref <18)

## 2019-09-19 MED ORDER — AZITHROMYCIN 250 MG PO TABS: 250.0000 mg | ORAL_TABLET | Freq: Every day | ORAL | 0 refills | Status: DC

## 2019-09-19 MED ORDER — SODIUM CHLORIDE 0.9% FLUSH: 3.0000 mL | Freq: Once | INTRAVENOUS | Status: DC

## 2019-09-19 MED ORDER — ACETAMINOPHEN 325 MG PO TABS
650.0000 mg | ORAL_TABLET | Freq: Once | ORAL | Status: AC
  Administered 2019-09-19: 14:00:00 650 mg via ORAL
  Filled 2019-09-19: qty 2

## 2019-09-19 MED ORDER — DOXYCYCLINE HYCLATE 100 MG PO CAPS: 100.0000 mg | ORAL_CAPSULE | Freq: Two times a day (BID) | ORAL | 0 refills | Status: DC

## 2019-09-19 MED ORDER — SODIUM CHLORIDE 0.9 % IV BOLUS
1000.0000 mL | Freq: Once | INTRAVENOUS | Status: AC
  Administered 2019-09-19: 1000 mL via INTRAVENOUS

## 2019-09-19 NOTE — ED Triage Notes (Signed)
Pt had positive covid test on 12/11, states that he was tested at a drive through testing site, has been feeling pretty good until yesterday when he felt some pain in his chest and some more difficulty breathing. Endorses cough, but denies fever. pcp prescribed dextromethorphan for cough which is working. resp e/u, nad.

## 2019-09-19 NOTE — ED Provider Notes (Signed)
[facility] EMERGENCY DEPARTMENT Provider Note   CSN: [id number] Arrival date & time: 09/19/19  1146     History Chief Complaint  Patient presents with  . Shortness of Breath    Nicholas Maxwell is a 73 y.o. male.  HPI  73 year old male presents with difficulty taking a full breath.  He states that he was diagnosed with the novel coronavirus about a week ago.  He has been having symptoms overall for about 12 days.  Cough, shortness of breath.  He vomited a couple days ago but none since.  He has a weird taste.  He denies any chest pain.  The shortness of breath has been stable.  However since yesterday he feels like he is worsening because whenever he takes a deep breath it is inducing a cough and it feels like he cannot get a full breath then.  He is on dextromethorphan for cough.  No chest pain or burning sensation like when he had his MI a couple years ago.  No leg swelling.  Past Medical History:  Diagnosis Date  . History of kidney stones   . Myocardial infarction ([place]) 04/2017  . Statin intolerance    Used >2 different statins    Patient Active Problem List   Diagnosis Date Noted  . Statin intolerance 11/21/2018  . 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 History of kidney stones 02/26/2018  . Osteoarthritis of right knee 02/26/2018  . Post PTCA 05/24/2017  . Myocardial infarction ([place]) 04/2017  . Polyp of colon, adenomatous 12/08/2016  . Arthritis of carpometacarpal [facility]) joint of left thumb 11/26/2016  . Essential hypertension 05/12/2015  . 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 Hypertrophic and atrophic condition of skin 05/12/2015  . Hyperlipidemia 05/12/2015  . Other seborrheic keratosis 05/12/2015  . Type 2 diabetes mellitus without complication, without long-term current use of insulin ([place]) 05/12/2015  . Hypertrophy of prostate without urinary obstruction and other lower urinary tract symptoms (LUTS) 04/08/2013    Past  Surgical History:  Procedure Laterality Date  . CORONARY ANGIOPLASTY WITH STENT PLACEMENT  05/24/2017  . CORONARY STENT INTERVENTION N/A 05/24/2017   Procedure: CORONARY STENT INTERVENTION;  Surgeon: Nigel Mormon, MD;  Location: [place] CV LAB;  Service: Cardiovascular;  Laterality: N/A;  MID Circumflex 4.0x22 Onyx MID LAD 2.75x15 Onyx  . CYSTOSCOPY WITH URETEROSCOPY, STONE BASKETRY AND STENT PLACEMENT  ~ 1990  . HERNIA REPAIR    . INTRAVASCULAR PRESSURE WIRE/FFR STUDY N/A 05/24/2017   Procedure: INTRAVASCULAR PRESSURE WIRE/FFR STUDY;  Surgeon: Nigel Mormon, MD;  Location: [place] CV LAB;  Service: Cardiovascular;  Laterality: N/A;  MID LAD  . KNEE ARTHROSCOPY Right 2016  . LEFT HEART CATH AND CORONARY ANGIOGRAPHY N/A 05/24/2017   Procedure: LEFT HEART CATH AND CORONARY ANGIOGRAPHY;  Surgeon: Nigel Mormon, MD;  Location: [place] CV LAB;  Service: Cardiovascular;  Laterality: N/A;  . UMBILICAL HERNIA REPAIR  1992       Family History  Problem Relation Age of Onset  . Transient ischemic attack Father 69  . Bone cancer Father   . CAD Mother 60       CABG    Social History   Tobacco Use  . Smoking status: Former Smoker    Packs/day: 1.00    Years: 30.00    Pack years: 30.00    Types: Cigarettes    Quit date: 2003    Years since quitting: 17.9  . Smokeless tobacco: Never Used  Substance Use Topics  . Alcohol use: No  . Drug use: No    Home Medications Prior to Admission medications   Medication Sig Start Date End Date Taking? Authorizing Provider  allopurinol (ZYLOPRIM) 300 MG tablet TAKE 1 TABLET (300 MG TOTAL) BY MOUTH DAILY WITH LUNCH. 07/30/19  Yes Worley, [place], PA  benzonatate (TESSALON PERLES) 100 MG capsule Take 1 capsule (100 mg total) by mouth 3 (three) times daily as needed for cough. 09/12/19  Yes Worley, [place]ona [place], PA  ramipril (ALTACE) 5 MG capsule TAKE 1 CAPSULE BY MOUTH EVERY DAY 06/03/19  Yes Morene Rankins, [place], PA  ACCU-CHEK  SOFTCLIX LANCETS lancets USE TO CHECK BLOOD SUGAR TWICE DAILY 01/07/18   [provider]  alfuzosin (UROXATRAL) 10 MG 24 hr tablet Take 10 mg by mouth daily with supper. 04/15/17   [provider]  aspirin EC 81 MG tablet Take 81 mg by mouth daily with breakfast.    [provider]  azithromycin (ZITHROMAX) 250 MG tablet Take two tabs daily, then one tab daily x 4 days Patient not taking: Reported on 09/19/2019 09/12/19   [name], PA  doxycycline (VIBRAMYCIN) 100 MG capsule Take 1 capsule (100 mg total) by mouth 2 (two) times daily. One po bid x 7 days 09/19/19   Sherwood Gambler, MD  finasteride (PROSCAR) 5 MG tablet Take 5 mg by mouth daily. 12/04/18   [provider]  glucose blood (ACCU-CHEK AVIVA) test strip Use to check blood sugar twice daily 10/03/17   [provider]  metoprolol succinate (TOPROL-XL) 25 MG 24 hr tablet Take 25 mg by mouth daily.    [provider]  nitroGLYCERIN (NITROSTAT) 0.4 MG SL tablet Place 0.4 mg under the tongue every 5 (five) minutes x 3 doses as needed. For chest pain. 05/11/17   [provider]  ranolazine (RANEXA) 500 MG 12 hr tablet TAKE 2 TABLETS BY MOUTH TWICE A DAY 04/03/19   Patwardhan, Reynold Bowen, MD  REPATHA SURECLICK [id number] MG/ML SOAJ INJECT 1ML EVERY 2 WEEKS 09/01/19   Adrian Prows, MD    Allergies    Crestor [rosuvastatin calcium] and Statins  Review of Systems   Review of Systems  Constitutional: Positive for fatigue.  Respiratory: Positive for cough and shortness of breath.   Cardiovascular: Negative for chest pain and leg swelling.  Gastrointestinal: Negative for abdominal pain.  All other systems reviewed and are negative.   Physical Exam Updated Vital Signs BP 104/64   Pulse 62   Temp 100.3 F (37.9 C) (Oral)   Resp (!) 22   SpO2 94%   Physical Exam Vitals and nursing note reviewed.  Constitutional:      General: He is not in acute distress.    Appearance: He is  well-developed. He is not ill-appearing or diaphoretic.  HENT:     Head: Normocephalic and atraumatic.     Right Ear: External ear normal.     Left Ear: External ear normal.     Nose: Nose normal.  Eyes:     General:        Right eye: No discharge.        Left eye: No discharge.  Cardiovascular:     Rate and Rhythm: Normal rate and regular rhythm.     Heart sounds: Normal heart sounds.  Pulmonary:     Effort: Pulmonary effort is normal. No accessory muscle usage.     Breath sounds: Normal breath sounds.  Abdominal:     Palpations: Abdomen is  soft.     Tenderness: There is no abdominal tenderness.  Musculoskeletal:     Cervical back: Neck supple.     Right lower leg: No edema.     Left lower leg: No edema.  Skin:    General: Skin is warm and dry.  Neurological:     Mental Status: He is alert.  Psychiatric:        Mood and Affect: Mood is not anxious.     ED Results / Procedures / Treatments   Labs (all labs ordered are listed, but only abnormal results are displayed) Labs Reviewed  BASIC METABOLIC PANEL - Abnormal; Notable for the following components:      Result Value   Glucose, Bld 150 (*)    BUN 31 (*)    Creatinine, Ser 1.94 (*)    GFR calc non Af Amer 34 (*)    GFR calc Af Amer 39 (*)    All other components within normal limits  CBC - Abnormal; Notable for the following components:   RBC 3.11 (*)    Hemoglobin 10.6 (*)    HCT 31.6 (*)    MCV 101.6 (*)    MCH 34.1 (*)    All other components within normal limits  TROPONIN I (HIGH SENSITIVITY)  TROPONIN I (HIGH SENSITIVITY)    EKG EKG Interpretation  Date/Time:  Friday September 19 2019 11:54:30 EST Ventricular Rate:  75 PR Interval:  134 QRS Duration: 78 QT Interval:  384 QTC Calculation: 428 R Axis:   72 Text Interpretation: Normal sinus rhythm nonspecific T waves Confirmed by Sherwood Gambler [phone]) on 09/19/2019 12:47:06 PM   Radiology DG Chest Portable 1 View  Result Date:  09/19/2019 CLINICAL DATA:  Shortness of breath, COVID-19 positive. EXAM: PORTABLE CHEST 1 VIEW COMPARISON:  May 10, 2017. FINDINGS: The heart size and mediastinal contours are within normal limits. No pneumothorax or pleural effusion is noted. Left lung is clear. Mild right basilar atelectasis or infiltrate is noted. The visualized skeletal structures are unremarkable. IMPRESSION: Mild right basilar atelectasis or infiltrate is noted. Followup radiographs are recommended until resolution. Electronically Signed   By: Marijo Conception M.D.   On: 09/19/2019 14:00    Procedures Procedures (including critical care time)  Medications Ordered in ED Medications  sodium chloride flush (NS) 0.9 % injection 3 mL (3 mLs Intravenous Not Given 09/19/19 1412)  acetaminophen (TYLENOL) tablet 650 mg (650 mg Oral Given 09/19/19 1404)  sodium chloride 0.9 % bolus 1,000 mL (1,000 mLs Intravenous New Bag/Given 09/19/19 1408)    ED Course  I have reviewed the triage vital signs and the nursing notes.  Pertinent labs & imaging results that were available during my care of the patient were reviewed by me and considered in my medical decision making (see chart for details).    MDM Rules/Calculators/A&P                      Despite triage report, patient does not have any chest pain.  My suspicion for MI is pretty low.  Is more of a trouble getting a deep breath but his shortness of breath has been stable.  Given the seems to be a new change over the last 24 hours, labs and chest x-ray were obtained.  His troponins are negative and given the combined low suspicion for MI I think is unlikely.  Highly doubt PE.  Chest x-ray shows possible pneumonia and given his had over 1 week of  Covid symptoms with a new change I think it is possible he could have pneumonia on top of it.  We will treat as bacterial pneumonia with doxycycline.  He is not hypoxic and was able to ambulate without difficulty.  He does have a mild AKI but  given he is feeling well and tolerating fluids okay I think it is reasonable to discharge home with increased fluids.  Return precautions.  Nicholas MCMEANS was evaluated in Emergency Department on 09/19/2019 for the symptoms described in the history of present illness. He was evaluated in the context of the global COVID-19 pandemic, which necessitated consideration that the patient might be at risk for infection with the SARS-CoV-2 virus that causes COVID-19. Institutional protocols and algorithms that pertain to the evaluation of patients at risk for COVID-19 are in a state of rapid change based on information released by regulatory bodies including the CDC and federal and state organizations. These policies and algorithms were followed during the patient's care in the ED.  Final Clinical Impression(s) / ED Diagnoses Final diagnoses:  COVID-19 virus infection  Community acquired pneumonia of right lower lobe of lung  Dehydration    Rx / DC Orders ED Discharge Orders         Ordered    azithromycin (ZITHROMAX) 250 MG tablet  Daily,   Status:  Discontinued     09/19/19 1518    doxycycline (VIBRAMYCIN) 100 MG capsule  2 times daily     09/19/19 1519           Sherwood Gambler, MD 09/19/19 1533

## 2019-09-19 NOTE — ED Notes (Signed)
Pt ambulated in room, sats did not drop below 95% on RA; pt tolerated well

## 2019-09-19 NOTE — ED Notes (Signed)
Patient verbalizes understanding of discharge instructions. Opportunity for questioning and answers were provided. Pt discharged from ED. 

## 2019-09-23 ENCOUNTER — Encounter: Payer: Self-pay | Admitting: Physician Assistant

## 2019-09-23 ENCOUNTER — Ambulatory Visit (INDEPENDENT_AMBULATORY_CARE_PROVIDER_SITE_OTHER): Payer: PPO | Admitting: Physician Assistant

## 2019-09-23 DIAGNOSIS — J189 Pneumonia, unspecified organism: Secondary | ICD-10-CM | POA: Diagnosis not present

## 2019-09-23 DIAGNOSIS — U071 COVID-19: Secondary | ICD-10-CM | POA: Diagnosis not present

## 2019-09-23 DIAGNOSIS — N1831 Chronic kidney disease, stage 3a: Secondary | ICD-10-CM | POA: Diagnosis not present

## 2019-09-23 NOTE — Progress Notes (Signed)
Virtual Visit via Video   I connected with Nicholas Maxwell on 09/23/19 at 11:20 AM EST by a video enabled telemedicine application and verified that I am speaking with the correct person using two identifiers. Location patient: Home Location provider: [facility], Office Persons participating in the virtual visit: [place], Handfield PA-C  I discussed the limitations of evaluation and management by telemedicine and the availability of in person appointments. The patient expressed understanding and agreed to proceed.  Subjective:   HPI:   Went to ER on 09/19/19 for dehydration, COVID-19 and CAP. Bmp showed decreased kidney function likely due to dehydration and CXR showed -- "Mild right basilar atelectasis or infiltrate is noted." Was started on doxycycline.  Feeling much better and he turned a corner this morning. Has lost about 10 lbs. Good appetite, feels like he is hydrating well. Overall feels weak. Has postponed his knee surgery.  Denies: dizziness or lightheadedness with standing, SOB, chest pain, LE swelling  Wt Readings from Last 5 Encounters:  09/12/19 195 lb (88.5 kg)  09/01/19 204 lb (92.5 kg)  08/25/19 205 lb 9.6 oz (93.3 kg)  05/28/19 199 lb 6.1 oz (90.4 kg)  05/26/19 201 lb (91.2 kg)     ROS: See pertinent positives and negatives per HPI.  Patient Active Problem List   Diagnosis Date Noted  . Statin intolerance 11/21/2018  . 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 History of kidney stones 02/26/2018  . Osteoarthritis of right knee 02/26/2018  . Post PTCA 05/24/2017  . Myocardial infarction ([place]) 04/2017  . Polyp of colon, adenomatous 12/08/2016  . Arthritis of carpometacarpal [facility]) joint of left thumb 11/26/2016  . Essential hypertension 05/12/2015  . 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 Hypertrophic and atrophic condition of skin 05/12/2015  . Hyperlipidemia 05/12/2015  . Other seborrheic keratosis 05/12/2015  . Type 2  diabetes mellitus without complication, without long-term current use of insulin ([place]) 05/12/2015  . Hypertrophy of prostate without urinary obstruction and other lower urinary tract symptoms (LUTS) 04/08/2013    Social History   Tobacco Use  . Smoking status: Former Smoker    Packs/day: 1.00    Years: 30.00    Pack years: 30.00    Types: Cigarettes    Quit date: 2003    Years since quitting: 17.9  . Smokeless tobacco: Never Used  Substance Use Topics  . Alcohol use: No    Current Outpatient Medications:  .  ACCU-CHEK SOFTCLIX LANCETS lancets, USE TO CHECK BLOOD SUGAR TWICE DAILY, Disp: , Rfl:  .  alfuzosin (UROXATRAL) 10 MG 24 hr tablet, Take 10 mg by mouth daily with supper., Disp: , Rfl:  .  allopurinol (ZYLOPRIM) 300 MG tablet, TAKE 1 TABLET (300 MG TOTAL) BY MOUTH DAILY WITH LUNCH., Disp: 90 tablet, Rfl: 0 .  aspirin EC 81 MG tablet, Take 81 mg by mouth daily with breakfast., Disp: , Rfl:  .  benzonatate (TESSALON PERLES) 100 MG capsule, Take 1 capsule (100 mg total) by mouth 3 (three) times daily as needed for cough., Disp: 30 capsule, Rfl: 1 .  doxycycline (VIBRAMYCIN) 100 MG capsule, Take 1 capsule (100 mg total) by mouth 2 (two) times daily. One po bid x 7 days, Disp: 14 capsule, Rfl: 0 .  finasteride (PROSCAR) 5 MG tablet, Take 5 mg by mouth daily., Disp: , Rfl:  .  glucose blood (ACCU-CHEK AVIVA) test strip, Use to check blood sugar twice daily, Disp: , Rfl:  .  metoprolol succinate (TOPROL-XL) 25 MG 24 hr tablet, Take 25 mg by mouth daily., Disp: , Rfl:  .  nitroGLYCERIN (NITROSTAT) 0.4 MG SL tablet, Place 0.4 mg under the tongue every 5 (five) minutes x 3 doses as needed. For chest pain., Disp: , Rfl:  .  ramipril (ALTACE) 5 MG capsule, TAKE 1 CAPSULE BY MOUTH EVERY DAY, Disp: 90 capsule, Rfl: 1 .  ranolazine (RANEXA) 500 MG 12 hr tablet, TAKE 2 TABLETS BY MOUTH TWICE A DAY, Disp: 360 tablet, Rfl: 1 .  REPATHA SURECLICK [id number] MG/ML SOAJ, INJECT 1ML EVERY 2 WEEKS (Patient  taking differently: Inject 140 mg into the muscle every 14 (fourteen) days. ), Disp: 8 pen, Rfl: 14  Allergies  Allergen Reactions  . Crestor [Rosuvastatin Calcium] Anaphylaxis and Swelling    Tongue swells/throat closes  . Statins Other (See Comments)    Muscle/joint pain with ALL other statin drug but Crestor is severe    Objective:   VITALS: Per patient if applicable, see vitals. GENERAL: Alert, appears well and in no acute distress. HEENT: Atraumatic, conjunctiva clear, no obvious abnormalities on inspection of external nose and ears. NECK: Normal movements of the head and neck. CARDIOPULMONARY: No increased WOB. Speaking in clear sentences. I:E ratio WNL.  MS: Moves all visible extremities without noticeable abnormality. PSYCH: Pleasant and cooperative, well-groomed. Speech normal rate and rhythm. Affect is appropriate. Insight and judgement are appropriate. Attention is focused, linear, and appropriate.  NEURO: CN grossly intact. Oriented as arrived to appointment on time with no prompting. Moves both UE equally.  SKIN: No obvious lesions, wounds, erythema, or cyanosis noted on face or hands.  Assessment and Plan:   Arcenio was seen today for cough.  Diagnoses and all orders for this visit:  [id number]; Community acquired pneumonia of right lower lobe of lung No red flags on discussion. Is in no apparent distress while talking. Continue doxycycline. Will update CXR in 1 month or so to reassess infiltrate. Follow-up if symptoms worsen in the interim.  Stage 3a chronic kidney disease Anticipate that labs have improved with his ongoing hydration. Repeat BMP next week, worsening precautions advised. -     Basic metabolic panel; Future  . Reviewed expectations re: course of current medical issues. . Discussed self-management of symptoms. . Outlined signs and symptoms indicating need for more acute intervention. . Patient verbalized understanding and all questions were answered.  Marland Kitchen Health Maintenance issues including appropriate healthy diet, exercise, and smoking avoidance were discussed with patient. . See orders for this visit as documented in the electronic medical record.  I discussed the assessment and treatment plan with the patient. The patient was provided an opportunity to ask questions and all were answered. The patient agreed with the plan and demonstrated an understanding of the instructions.   The patient was advised to call back or seek an in-person evaluation if the symptoms worsen or if the condition fails to improve as anticipated.    [place], [place] 09/23/2019

## 2019-10-01 ENCOUNTER — Other Ambulatory Visit: Payer: Self-pay

## 2019-10-01 ENCOUNTER — Other Ambulatory Visit (INDEPENDENT_AMBULATORY_CARE_PROVIDER_SITE_OTHER): Payer: PPO

## 2019-10-01 DIAGNOSIS — N1831 Chronic kidney disease, stage 3a: Secondary | ICD-10-CM | POA: Diagnosis not present

## 2019-10-01 LAB — BASIC METABOLIC PANEL
BUN: 31 mg/dL — ABNORMAL HIGH (ref 6–23)
CO2: 25 mEq/L (ref 19–32)
Calcium: 9.4 mg/dL (ref 8.4–10.5)
Chloride: 101 mEq/L (ref 96–112)
Creatinine, Ser: 1.48 mg/dL (ref 0.40–1.50)
GFR: 46.64 mL/min — ABNORMAL LOW (ref >60.00)
Glucose, Bld: 256 mg/dL — ABNORMAL HIGH (ref 70–99)
Potassium: 4.7 mEq/L (ref 3.5–5.1)
Sodium: 136 mEq/L (ref 135–145)

## 2019-10-07 ENCOUNTER — Other Ambulatory Visit: Payer: Self-pay

## 2019-10-07 ENCOUNTER — Encounter: Payer: Self-pay | Admitting: Physician Assistant

## 2019-10-08 ENCOUNTER — Other Ambulatory Visit (INDEPENDENT_AMBULATORY_CARE_PROVIDER_SITE_OTHER): Payer: PPO

## 2019-10-08 ENCOUNTER — Other Ambulatory Visit: Payer: Self-pay | Admitting: Physician Assistant

## 2019-10-08 ENCOUNTER — Telehealth: Payer: Self-pay | Admitting: Physician Assistant

## 2019-10-08 DIAGNOSIS — N1831 Chronic kidney disease, stage 3a: Secondary | ICD-10-CM

## 2019-10-08 DIAGNOSIS — D649 Anemia, unspecified: Secondary | ICD-10-CM

## 2019-10-08 DIAGNOSIS — Z419 Encounter for procedure for purposes other than remedying health state, unspecified: Secondary | ICD-10-CM | POA: Diagnosis not present

## 2019-10-08 LAB — COMPREHENSIVE METABOLIC PANEL
ALT: 16 U/L (ref 0–53)
AST: 15 U/L (ref 0–37)
Albumin: 3.9 g/dL (ref 3.5–5.2)
Alkaline Phosphatase: 60 U/L (ref 39–117)
BUN: 28 mg/dL — ABNORMAL HIGH (ref 6–23)
CO2: 26 mEq/L (ref 19–32)
Calcium: 9.1 mg/dL (ref 8.4–10.5)
Chloride: 101 mEq/L (ref 96–112)
Creatinine, Ser: 1.41 mg/dL (ref 0.40–1.50)
GFR: 49.32 mL/min — ABNORMAL LOW (ref >60.00)
Glucose, Bld: 140 mg/dL — ABNORMAL HIGH (ref 70–99)
Potassium: 4.9 mEq/L (ref 3.5–5.1)
Sodium: 135 mEq/L (ref 135–145)
Total Bilirubin: 0.5 mg/dL (ref 0.2–1.2)
Total Protein: 6.6 g/dL (ref 6.0–8.3)

## 2019-10-08 LAB — CBC WITH DIFFERENTIAL/PLATELET
Basophils Absolute: 0 10*3/uL (ref 0.0–0.1)
Basophils Relative: 0.5 % (ref 0.0–3.0)
Eosinophils Absolute: 0.1 10*3/uL (ref 0.0–0.7)
Eosinophils Relative: 1.2 % (ref 0.0–5.0)
HCT: 32.9 % — ABNORMAL LOW (ref 39.0–52.0)
Hemoglobin: 11 g/dL — ABNORMAL LOW (ref 13.0–17.0)
Lymphocytes Relative: 25.3 % (ref 12.0–46.0)
Lymphs Abs: 1.4 10*3/uL (ref 0.7–4.0)
MCHC: 33.4 g/dL (ref 30.0–36.0)
MCV: 102.9 fl — ABNORMAL HIGH (ref 78.0–100.0)
Monocytes Absolute: 0.5 10*3/uL (ref 0.1–1.0)
Monocytes Relative: 8.6 % (ref 3.0–12.0)
Neutro Abs: 3.5 10*3/uL (ref 1.4–7.7)
Neutrophils Relative %: 64.4 % (ref 43.0–77.0)
Platelets: 248 10*3/uL (ref 150.0–400.0)
RBC: 3.2 Mil/uL — ABNORMAL LOW (ref 4.22–5.81)
RDW: 14.8 % (ref 11.5–15.5)
WBC: 5.4 10*3/uL (ref 4.0–10.5)

## 2019-10-08 LAB — URINALYSIS, ROUTINE W REFLEX MICROSCOPIC
Bilirubin Urine: NEGATIVE
Hgb urine dipstick: NEGATIVE
Ketones, ur: NEGATIVE
Leukocytes,Ua: NEGATIVE
Nitrite: NEGATIVE
RBC / HPF: NONE SEEN (ref 0–?)
Specific Gravity, Urine: 1.02 (ref 1.000–1.030)
Total Protein, Urine: NEGATIVE
Urine Glucose: NEGATIVE
Urobilinogen, UA: 0.2 (ref 0.0–1.0)
pH: 6 (ref 5.0–8.0)

## 2019-10-08 LAB — PROTIME-INR
INR: 1.1 ratio — ABNORMAL HIGH (ref 0.8–1.0)
Prothrombin Time: 12.6 s (ref 9.6–13.1)

## 2019-10-08 NOTE — Telephone Encounter (Signed)
See result notes. 

## 2019-10-08 NOTE — Telephone Encounter (Signed)
Nicholas Maxwell says he keeps missing Donna's call and wanted to know if it was about his labs this AM.

## 2019-10-14 ENCOUNTER — Ambulatory Visit: Admit: 2019-10-14 | Payer: PPO | Admitting: Orthopedic Surgery

## 2019-10-14 SURGERY — ARTHROPLASTY, KNEE, TOTAL
Anesthesia: Spinal | Site: Knee | Laterality: Right

## 2019-10-20 ENCOUNTER — Other Ambulatory Visit (HOSPITAL_COMMUNITY): Payer: PPO

## 2019-10-20 NOTE — Patient Instructions (Signed)
DUE TO COVID-19 ONLY ONE VISITOR IS ALLOWED TO COME WITH YOU AND STAY IN THE WAITING ROOM ONLY DURING PRE OP AND PROCEDURE DAY OF SURGERY. THE 1 VISITOR MAY VISIT WITH YOU AFTER SURGERY IN YOUR PRIVATE ROOM DURING VISITING HOURS ONLY!  YOU NEED TO HAVE A COVID 19 TEST ON_______ @_______ , THIS TEST MUST BE DONE BEFORE SURGERY, COME  Nicholas Maxwell, Nicholas Maxwell , [phone].  ([place]) ONCE YOUR COVID TEST IS COMPLETED, PLEASE BEGIN THE QUARANTINE INSTRUCTIONS AS OUTLINED IN YOUR HANDOUT.                Nicholas Maxwell    Your procedure is scheduled on: 10/23/19   Report to [facility] Main  Entrance   Report to Short Stay at 5:30 AM    Do not eat food After Midnight.   YOU MAY HAVE CLEAR LIQUIDS FROM MIDNIGHT UNTIL 4:30AM.   At 4:30AM Please finish the prescribed Pre-Surgery Gatorade drink  . Nothing by mouth after you finish the Gatorade drink !    Call this number if you have problems the morning of surgery [phone]   . BRUSH YOUR TEETH MORNING OF SURGERY AND RINSE YOUR MOUTH OUT, NO CHEWING GUM CANDY OR MINTS.     Take these medicines the morning of surgery with A SIP OF WATER:*( Renexa), Ranolazine, Metoprolol (Toprol XL),Allopurinol, (Zyloprim)  DO NOT TAKE ANY DIABETIC MEDICATIONS DAY OF YOUR SURGERY    How to Manage Your Diabetes Before and After Surgery  Why is it important to control my blood sugar before and after surgery? . Improving blood sugar levels before and after surgery helps healing and can limit problems. . A way of improving blood sugar control is eating a healthy diet by: o  Eating less sugar and carbohydrates o  Increasing activity/exercise o  Talking with your doctor about reaching your blood sugar goals . High blood sugars (greater than 180 mg/dL) can raise your risk of infections and slow your recovery, so you will need to focus on controlling your diabetes during the weeks before surgery. . Make sure that the doctor  who takes care of your diabetes knows about your planned surgery including the date and location.  How do I manage my blood sugar before surgery? . Check your blood sugar at least 4 times a day, starting 2 days before surgery, to make sure that the level is not too high or low. o Check your blood sugar the morning of your surgery when you wake up and every 2 hours until you get to the Short Stay unit. . If your blood sugar is less than 70 mg/dL, you will need to treat for low blood sugar: o Do not take insulin. o Treat a low blood sugar (less than 70 mg/dL) with  cup of clear juice (cranberry or apple), 4 glucose tablets, OR glucose gel. o Recheck blood sugar in 15 minutes after treatment (to make sure it is greater than 70 mg/dL). If your blood sugar is not greater than 70 mg/dL on recheck, call [phone] for further instructions. . Report your blood sugar to the short stay nurse when you get to Short Stay.  . If you are admitted to the hospital after surgery: o Your blood sugar will be checked by the staff and you will probably be given insulin after surgery (instead of oral diabetes medicines) to make sure you have good blood sugar levels. o The goal for blood sugar control after surgery  is 80-180 mg/dL.   WHAT DO I DO ABOUT MY DIABETES MEDICATION?  Marland Kitchen Do not take oral diabetes medicines (pills) the morning of surgery.  .                                You may not have any metal on your body including               piercings  Do not wear jewelry,, lotions, powders or deodorant                Men may shave face and neck.   Do not bring valuables to the hospital. [place].  Contacts, dentures or bridgework may not be worn into surgery.     Patients discharged the day of surgery will not be allowed to drive home.  IF YOU ARE HAVING SURGERY AND GOING HOME THE SAME DAY, YOU MUST HAVE AN ADULT TO DRIVE YOU HOME AND BE WITH YOU FOR  24 HOURS.  YOU MAY GO HOME BY TAXI OR UBER OR ORTHERWISE, BUT AN ADULT MUST ACCOMPANY YOU HOME AND STAY WITH YOU FOR 24 HOURS.  Name and phone number of your driver:  Special Instructions: N/A              Please read over the following fact sheets you were given: _____________________________________________________________________             [facility] - Preparing for [facility]  Before surgery, you can play an important role.   Because skin is not sterile, your skin needs to be as free of germs as possible.   You can reduce the number of germs on your skin by washing with CHG (chlorahexidine gluconate) soap before surgery.   CHG is an antiseptic cleaner which kills germs and bonds with the skin to continue killing germs even after washing. Please DO NOT use if you have an allergy to CHG or antibacterial soaps.   If your skin becomes reddened/irritated stop using the CHG and inform your nurse when you arrive at Short Stay. .  You may shave your face/neck. Please follow these instructions carefully:  1.  Shower with CHG Soap the night before surgery and the  morning of Surgery.  2.  If you choose to wash your hair, wash your hair first as usual with your  normal  shampoo.  3.  After you shampoo, rinse your hair and body thoroughly to remove the  shampoo.                                        4.  Use CHG as you would any other liquid soap.  You can apply chg directly  to the skin and wash                       Gently with a scrungie or clean washcloth.  5.  Apply the CHG Soap to your body ONLY FROM THE NECK DOWN.   Do not use on face/ open                           Wound or open sores. Avoid contact with  eyes, ears mouth and genitals (private parts).                       Wash face,  Genitals (private parts) with your normal soap.             6.  Wash thoroughly, paying special attention to the area where your surgery  will be performed.  7.  Thoroughly rinse your body with warm water  from the neck down.  8.  DO NOT shower/wash with your normal soap after using and rinsing off  the CHG Soap.             9.  Pat yourself dry with a clean towel.            10.  Wear clean pajamas.            11.  Place clean sheets on your bed the night of your first shower and do not  sleep with pets. Day of Surgery : Do not apply any lotions/deodorants the morning of surgery.  Please wear clean clothes to the hospital/surgery center.  FAILURE TO FOLLOW THESE INSTRUCTIONS MAY RESULT IN THE CANCELLATION OF YOUR SURGERY PATIENT SIGNATURE_________________________________  NURSE SIGNATURE__________________________________   Incentive Spirometer  An incentive spirometer is a tool that can help keep your lungs clear and active. This tool measures how well you are filling your lungs with each breath. Taking long deep breaths may help reverse or decrease the chance of developing breathing (pulmonary) problems (especially infection) following:  A long period of time when you are unable to move or be active. BEFORE THE PROCEDURE   If the spirometer includes an indicator to show your best effort, your nurse or respiratory therapist will set it to a desired goal.  If possible, sit up straight or lean slightly forward. Try not to slouch.  Hold the incentive spirometer in an upright position. INSTRUCTIONS FOR USE  1. Sit on the edge of your bed if possible, or sit up as far as you can in bed or on a chair. 2. Hold the incentive spirometer in an upright position. 3. Breathe out normally. 4. Place the mouthpiece in your mouth and seal your lips tightly around it. 5. Breathe in slowly and as deeply as possible, raising the piston or the ball toward the top of the column. 6. Hold your breath for 3-5 seconds or for as long as possible. Allow the piston or ball to fall to the bottom of the column. 7. Remove the mouthpiece from your mouth and breathe out normally. 8. Rest for a few seconds and repeat  Steps 1 through 7 at least 10 times every 1-2 hours when you are awake. Take your time and take a few normal breaths between deep breaths. 9. The spirometer may include an indicator to show your best effort. Use the indicator as a goal to work toward during each repetition. 10. After each set of 10 deep breaths, practice coughing to be sure your lungs are clear. If you have an incision (the cut made at the time of surgery), support your incision when coughing by placing a pillow or rolled up towels firmly against it. Once you are able to get out of bed, walk around indoors and cough well. You may stop using the incentive spirometer when instructed by your caregiver.  RISKS AND COMPLICATIONS  Take your time so you do not get dizzy or light-headed.  If you are in pain, you may  need to take or ask for pain medication before doing incentive spirometry. It is harder to take a deep breath if you are having pain. AFTER USE  Rest and breathe slowly and easily.  It can be helpful to keep track of a log of your progress. Your caregiver can provide you with a simple table to help with this. If you are using the spirometer at home, follow these instructions: [place] IF:   You are having difficultly using the spirometer.  You have trouble using the spirometer as often as instructed.  Your pain medication is not giving enough relief while using the spirometer.  You develop fever of 100.5 F (38.1 C) or higher. SEEK IMMEDIATE MEDICAL CARE IF:   You cough up bloody sputum that had not been present before.  You develop fever of 102 F (38.9 C) or greater.  You develop worsening pain at or near the incision site. MAKE SURE YOU:   Understand these instructions.  Will watch your condition.  Will get help right away if you are not doing well or get worse. Document Released: 01/29/2007 Document Revised: 12/11/2011 Document Reviewed: 04/01/2007 [facility] Patient Information 2014  [employer], [place].   ________________________________________________________________________  ________________________________________________________________________

## 2019-10-21 ENCOUNTER — Other Ambulatory Visit: Payer: Self-pay

## 2019-10-21 ENCOUNTER — Other Ambulatory Visit: Payer: Self-pay | Admitting: Cardiology

## 2019-10-21 ENCOUNTER — Encounter (HOSPITAL_COMMUNITY): Payer: Self-pay

## 2019-10-21 ENCOUNTER — Encounter (HOSPITAL_COMMUNITY)
Admission: RE | Admit: 2019-10-21 | Discharge: 2019-10-21 | Disposition: A | Discharge status: Home / Self Care | Payer: PPO | Source: Ambulatory Visit | Attending: Orthopedic Surgery | Admitting: Orthopedic Surgery

## 2019-10-21 ENCOUNTER — Other Ambulatory Visit: Payer: Self-pay | Admitting: Physician Assistant

## 2019-10-21 DIAGNOSIS — Z87891 Personal history of nicotine dependence: Secondary | ICD-10-CM | POA: Insufficient documentation

## 2019-10-21 DIAGNOSIS — Z01818 Encounter for other preprocedural examination: Secondary | ICD-10-CM | POA: Diagnosis not present

## 2019-10-21 DIAGNOSIS — M1711 Unilateral primary osteoarthritis, right knee: Secondary | ICD-10-CM | POA: Insufficient documentation

## 2019-10-21 DIAGNOSIS — Z955 Presence of coronary angioplasty implant and graft: Secondary | ICD-10-CM | POA: Diagnosis not present

## 2019-10-21 DIAGNOSIS — N183 Chronic kidney disease, stage 3 unspecified: Secondary | ICD-10-CM | POA: Diagnosis not present

## 2019-10-21 DIAGNOSIS — Z87442 Personal history of urinary calculi: Secondary | ICD-10-CM | POA: Insufficient documentation

## 2019-10-21 DIAGNOSIS — Z7982 Long term (current) use of aspirin: Secondary | ICD-10-CM | POA: Diagnosis not present

## 2019-10-21 DIAGNOSIS — I252 Old myocardial infarction: Secondary | ICD-10-CM | POA: Insufficient documentation

## 2019-10-21 DIAGNOSIS — I2510 Atherosclerotic heart disease of native coronary artery without angina pectoris: Secondary | ICD-10-CM | POA: Insufficient documentation

## 2019-10-21 DIAGNOSIS — Z79899 Other long term (current) drug therapy: Secondary | ICD-10-CM | POA: Insufficient documentation

## 2019-10-21 LAB — GLUCOSE, CAPILLARY: Glucose-Capillary: 168 mg/dL — ABNORMAL HIGH (ref 70–99)

## 2019-10-21 LAB — ABO/RH: ABO/RH(D): O POS

## 2019-10-21 LAB — CBC
HCT: 36 % — ABNORMAL LOW (ref 39.0–52.0)
Hemoglobin: 11.6 g/dL — ABNORMAL LOW (ref 13.0–17.0)
MCH: 33.9 pg (ref 26.0–34.0)
MCHC: 32.2 g/dL (ref 30.0–36.0)
MCV: 105.3 fL — ABNORMAL HIGH (ref 80.0–100.0)
Platelets: 198 10*3/uL (ref 150–400)
RBC: 3.42 MIL/uL — ABNORMAL LOW (ref 4.22–5.81)
RDW: 14.4 % (ref 11.5–15.5)
WBC: 6.3 10*3/uL (ref 4.0–10.5)
nRBC: 0 % (ref 0.0–0.2)

## 2019-10-21 LAB — BASIC METABOLIC PANEL
Anion gap: 7 (ref 5–15)
BUN: 22 mg/dL (ref 8–23)
CO2: 27 mmol/L (ref 22–32)
Calcium: 9.3 mg/dL (ref 8.9–10.3)
Chloride: 104 mmol/L (ref 98–111)
Creatinine, Ser: 1.23 mg/dL (ref 0.61–1.24)
GFR calc Af Amer: >60 mL/min (ref >60)
GFR calc non Af Amer: 58 mL/min — ABNORMAL LOW (ref >60)
Glucose, Bld: 186 mg/dL — ABNORMAL HIGH (ref 70–99)
Potassium: 4.6 mmol/L (ref 3.5–5.1)
Sodium: 138 mmol/L (ref 135–145)

## 2019-10-21 LAB — HEMOGLOBIN A1C
Hgb A1c MFr Bld: 6.2 % — ABNORMAL HIGH (ref 4.8–5.6)
Mean Plasma Glucose: 131.24 mg/dL

## 2019-10-21 LAB — SURGICAL PCR SCREEN
MRSA, PCR: NEGATIVE
Staphylococcus aureus: NEGATIVE

## 2019-10-21 NOTE — Progress Notes (Signed)
PCP - [name] PA Cardiologist - Linton Ham MD  Chest x-ray - 09/19/2019 EKG - 09/22/2019 Stress Test -  ECHO -  Cardiac Cath -   Sleep Study -  CPAP -   Fasting Blood Sugar - 120 Checks Blood Sugar __a few ___ times a month  Blood Thinner Instructions: Aspirin Instructions: 81mg  Last Dose: 10/18/2019  Anesthesia review:   Patient denies shortness of breath, fever, cough and chest pain at PAT appointment   Patient verbalized understanding of instructions that were given to them at the PAT appointment. Patient was also instructed that they will need to review over the PAT instructions again at home before surgery.

## 2019-10-22 NOTE — H&P (Addendum)
TOTAL KNEE ADMISSION H&P  Patient is being admitted for right total knee arthroplasty.  Subjective:  Chief Complaint:right knee pain.  HPI: Nicholas Maxwell, 74 y.o. male, has a history of pain and functional disability in the right knee due to arthritis and has failed non-surgical conservative treatments for greater than 12 weeks to includecorticosteriod injections, viscosupplementation injections and activity modification.  Onset of symptoms was gradual, starting 2 years ago with gradually worsening course since that time. The patient noted prior procedures on the knee to include  arthroscopy and menisectomy on the right knee(s).  Patient currently rates pain in the right knee(s) at 8 out of 10 with activity. Patient has worsening of pain with activity and weight bearing and pain that interferes with activities of daily living.  Patient has evidence of complete loss of joint space in the medial compartment the knee with associated osteophytes and moderate patellofemoral arthritis by imaging studies. There is no active infection.  Patient Active Problem List   Diagnosis Date Noted  . Statin intolerance 11/21/2018  . 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 History of kidney stones 02/26/2018  . Osteoarthritis of right knee 02/26/2018  . Post PTCA 05/24/2017  . Myocardial infarction ([place]) 04/2017  . Polyp of colon, adenomatous 12/08/2016  . Arthritis of carpometacarpal [facility]) joint of left thumb 11/26/2016  . Essential hypertension 05/12/2015  . 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 Hypertrophic and atrophic condition of skin 05/12/2015  . Hyperlipidemia 05/12/2015  . Other seborrheic keratosis 05/12/2015  . Type 2 diabetes mellitus without complication, without long-term current use of insulin ([place]) 05/12/2015  . Hypertrophy of prostate without urinary obstruction and other lower urinary tract symptoms (LUTS) 04/08/2013   Past Medical History:  Diagnosis Date  . History  of kidney stones   . Myocardial infarction ([place]) 04/2017  . Statin intolerance    Used >2 different statins    Past Surgical History:  Procedure Laterality Date  . CORONARY ANGIOPLASTY WITH STENT PLACEMENT  05/24/2017  . CORONARY STENT INTERVENTION N/A 05/24/2017   Procedure: CORONARY STENT INTERVENTION;  Surgeon: Nigel Mormon, MD;  Location: [place] [facility];  Service: Cardiovascular;  Laterality: N/A;  MID Circumflex 4.0x22 Onyx MID LAD 2.75x15 Onyx  . CYSTOSCOPY WITH URETEROSCOPY, STONE BASKETRY AND STENT PLACEMENT  ~ 1990  . HERNIA REPAIR    . INTRAVASCULAR PRESSURE WIRE/FFR STUDY N/A 05/24/2017   Procedure: INTRAVASCULAR PRESSURE WIRE/FFR STUDY;  Surgeon: Nigel Mormon, MD;  Location: [place] [facility];  Service: Cardiovascular;  Laterality: N/A;  MID LAD  . KNEE ARTHROSCOPY Right 2016  . LEFT HEART CATH AND CORONARY ANGIOGRAPHY N/A 05/24/2017   Procedure: LEFT HEART CATH AND CORONARY ANGIOGRAPHY;  Surgeon: Nigel Mormon, MD;  Location: [place] [facility];  Service: Cardiovascular;  Laterality: N/A;  . UMBILICAL HERNIA REPAIR  1992    No current facility-administered medications for this encounter.   Current Outpatient Medications  Medication Sig Dispense Refill Last Dose  . alfuzosin (UROXATRAL) 10 MG 24 hr tablet Take 10 mg by mouth daily with supper.     Marland Kitchen aspirin EC 81 MG tablet Take 81 mg by mouth daily with breakfast.     . finasteride (PROSCAR) 5 MG tablet Take 5 mg by mouth daily.     . metoprolol succinate (TOPROL-XL) 25 MG 24 hr tablet Take 25 mg by mouth daily.     . nitroGLYCERIN (NITROSTAT) 0.4 MG SL tablet Place 0.4 mg under the tongue every 5 (  five) minutes x 3 doses as needed for chest pain.      . ramipril (ALTACE) 5 MG capsule TAKE 1 CAPSULE BY MOUTH EVERY DAY (Patient taking differently: Take 5 mg by mouth daily. ) 90 capsule 1   . REPATHA SURECLICK [id number] MG/ML SOAJ INJECT 1ML EVERY 2 WEEKS (Patient taking differently: Inject 140 mg into the  muscle every 14 (fourteen) days. ) 8 pen 14   . ACCU-CHEK SOFTCLIX LANCETS lancets USE TO CHECK BLOOD SUGAR TWICE DAILY     . allopurinol (ZYLOPRIM) 300 MG tablet TAKE 1 TABLET (300 MG TOTAL) BY MOUTH DAILY WITH LUNCH. 90 tablet 0   . benzonatate (TESSALON PERLES) 100 MG capsule Take 1 capsule (100 mg total) by mouth 3 (three) times daily as needed for cough. (Patient not taking: Reported on 10/16/2019) 30 capsule 1 Not Taking at Unknown time  . doxycycline (VIBRAMYCIN) 100 MG capsule Take 1 capsule (100 mg total) by mouth 2 (two) times daily. One po bid x 7 days (Patient not taking: Reported on 10/16/2019) 14 capsule 0 Not Taking at Unknown time  . glucose blood (ACCU-CHEK AVIVA) test strip Use to check blood sugar twice daily     . ranolazine (RANEXA) 500 MG 12 hr tablet TAKE 2 TABLETS BY MOUTH TWICE A DAY 360 tablet 1    Allergies  Allergen Reactions  . Crestor [Rosuvastatin Calcium] Anaphylaxis and Swelling    Tongue swells/throat closes  . Statins Other (See Comments)    Muscle/joint pain with ALL other statin drug but Crestor is severe    Social History   Tobacco Use  . Smoking status: Former Smoker    Packs/day: 1.00    Years: 30.00    Pack years: 30.00    Types: Cigarettes    Quit date: 2003    Years since quitting: 18.0  . Smokeless tobacco: Never Used  Substance Use Topics  . Alcohol use: No    Family History  Problem Relation Age of Onset  . Transient ischemic attack Father 85  . Bone cancer Father   . CAD Mother 13       CABG     Review of Systems  Constitutional: Negative for chills and fever.  Respiratory: Negative for cough and shortness of breath.   Gastrointestinal: Negative for nausea and vomiting.  Musculoskeletal: Positive for arthralgias.    Objective:  Physical Exam Patient is a 74 year old male.  Well nourished and well developed. General: Alert and oriented x3, cooperative and pleasant, no acute distress. Head: normocephalic, atraumatic, neck  supple. Eyes: EOMI Respiratory: breath sounds clear in all fields, no wheezing, rales, or rhonchi Cardiovascular: Regular rate and rhythm, no murmurs, gallops or rubs. Abdomen: non-tender to palpation and soft, normoactive bowel sounds.  Musculoskeletal: BILATERAL KNEE EXAMINATION: Inspection: No erythema, no cellulitis, no effusion, neutral alignment Neurovascular: Intact sensation to light touch distally, brisk capillary refill Range of Motion: Full flexion, full extension Tenderness over the medial aspect the joint Stable medial and lateral collateral ligaments  Calves soft and nontender. Motor function intact in LE. Strength 5/5 LE bilaterally. Neuro: Distal pulses 2+. Sensation to light touch intact in LE.  Vital signs in last 24 hours:   Labs:  Estimated body mass index is 28.93 kg/m as calculated from the following:   Height as of 10/21/19: 5\' 10"  (1.778 m).   Weight as of 10/21/19: 91.4 kg.   Imaging Review Plain radiographs demonstrate severe degenerative joint disease of the right knee(s). The overall alignment isneutral.  The bone quality appears to be adequate for age and reported activity level.  Assessment/Plan:  End stage arthritis, right knee   The patient history, physical examination, clinical judgment of the provider and imaging studies are consistent with end stage degenerative joint disease of the right knee(s) and total knee arthroplasty is deemed medically necessary. The treatment options including medical management, injection therapy arthroscopy and arthroplasty were discussed at length. The risks and benefits of total knee arthroplasty were presented and reviewed. The risks due to aseptic loosening, infection, stiffness, patella tracking problems, thromboembolic complications and other imponderables were discussed. The patient acknowledged the explanation, agreed to proceed with the plan and consent was signed. Patient is being admitted for inpatient treatment  for surgery, pain control, PT, OT, prophylactic antibiotics, VTE prophylaxis, progressive ambulation and ADL's and discharge planning. The patient is planning to be discharged home.  Therapy Plans: outpatient therapy at [facility] in [place] Disposition: Home with wife Planned DVT Prophylaxis: aspirin 81mg  BID DME needed: walker, 3-n-1 PCP: [name], PA-C Cardiologist: Dr. Einar Gip, clearance received TXA: IV Allergies: Crestor - angioedema, statins - myalgias Anesthesia Concerns: none BMI: 28  Last HgbA1c: 6.2%   Other: Hx of MI in July 2018, 2 stents. Norco okay.  Patient's anticipated LOS is less than 2 midnights, meeting these requirements: - Younger than 19 - Lives within 1 hour of care - Has a competent adult at home to recover with post-op recover - NO history of  - Chronic pain requiring opiods  - Diabetes  - Coronary Artery Disease  - Heart failure  - Heart attack  - Stroke  - DVT/VTE  - Cardiac arrhythmia  - Respiratory Failure/COPD  - Renal failure  - Anemia  - Advanced Liver disease   - Patient was instructed on what medications to stop prior to surgery. - Follow-up visit in 2 weeks with Dr. Alvan Dame - Begin physical therapy following surgery - Pre-operative lab work as pre-surgical testing - Prescriptions will be provided in hospital at time of discharge  Griffith Citron, PA-C Orthopedic Surgery [facility] [place] [phone]

## 2019-10-22 NOTE — Progress Notes (Signed)
Anesthesia Chart Review   Case: [id number] Date/Time: 10/23/19 0700   Procedure: TOTAL KNEE ARTHROPLASTY (Right Knee) - 70 mins   Anesthesia type: Spinal   Pre-op diagnosis: Right knee osteoarthritis   [facility]: [facility] ROOM 09 / [facility]   Surgeons: Paralee Cancel, MD      DISCUSSION:73 y.o. former smoker (30 pack years, quit 10/02/01) with h/o CAD s/p two vessel PCI to [facility] and mLAD, CKD Stage III, right knee OA scheduled for above procedure 10/23/19 with Dr. Paralee Cancel.   Last seen by cardiologist, Dr. Virgina Jock 05/26/2019.  Stable with 1 year follow up recommended.   COVID positive 09/12/2019, seen in ED for dehydration and CAP, no admission.  Pt followed up with PCP 09/23/2019, at that time he had improved significantly.  Pt reports symptoms resolved during PAT nurse interview.  Evaluate DOS.   VS: BP 103/65   Pulse 62   Temp 36.8 C (Oral)   Resp 16   Ht 5\' 10"  (1.778 m)   Wt 91.4 kg   SpO2 99%   BMI 28.93 kg/m   PROVIDERS: [name], PA is PCP   Vernell Leep, MD is Cardiologist  LABS: Labs reviewed: Acceptable for surgery. (all labs ordered are listed, but only abnormal results are displayed)  Labs Reviewed  BASIC METABOLIC PANEL - Abnormal; Notable for the following components:      Result Value   Glucose, Bld 186 (*)    GFR calc non Af Amer 58 (*)    All other components within normal limits  CBC - Abnormal; Notable for the following components:   RBC 3.42 (*)    Hemoglobin 11.6 (*)    HCT 36.0 (*)    MCV 105.3 (*)    All other components within normal limits  HEMOGLOBIN A1C - Abnormal; Notable for the following components:   Hgb A1c MFr Bld 6.2 (*)    All other components within normal limits  GLUCOSE, CAPILLARY - Abnormal; Notable for the following components:   Glucose-Capillary 168 (*)    All other components within normal limits  SURGICAL PCR SCREEN  TYPE AND SCREEN  ABO/RH     IMAGES:   EKG: 09/22/2019 Rate 75 bpm Normal sinus rhythm    CV: Cardiac Cath 05/24/2018 LM: Normal LAD: 60% mid LAD calcific stenosis FFR +ve 0.77. Mild diffuse disease in small caliber distal LAD  Successful complex PTCA and DES placement Onyx 2.75 X 15 mm Mid LAD     (60%-->0%, TIMI III - TIMI III) Ramus intermedius: Small caliber vessel with 50-70% stenoses LCx: Large dominant vessel with mid circumflex subtotal occlusion with retrograde filling of distal circumflex and PDA Successful PTCA and DES placement Onyx 4.0 X 22 mm Mid left circumflex     (99%-->0%, TIMI I - TIMI III) RCA: Small caliber nondominant vessel with mild diffuse disease  Loaded with 600 mg clopidogrel Recommend aspirin 81 mg daily lifelong and clopidogrel 75 mg daily for at least 6 months. Recommend aggressive medical therapy for residual mild coronary artery disease. Continue metoprolol XL 25 mg, isosorbide mononitrate 30 mg. Patient is intolerant to statins and currently working with insurance company to start [place] Past Medical History:  Diagnosis Date  . History of kidney stones   . Myocardial infarction ([place]) 04/2017  . Statin intolerance    Used >2 different statins    Past Surgical History:  Procedure Laterality Date  . CORONARY ANGIOPLASTY WITH STENT PLACEMENT  05/24/2017  . CORONARY STENT INTERVENTION N/A 05/24/2017  Procedure: CORONARY STENT INTERVENTION;  Surgeon: Nigel Mormon, MD;  Location: [place] [facility];  Service: Cardiovascular;  Laterality: N/A;  MID Circumflex 4.0x22 Onyx MID LAD 2.75x15 Onyx  . CYSTOSCOPY WITH URETEROSCOPY, STONE BASKETRY AND STENT PLACEMENT  ~ 1990  . HERNIA REPAIR    . INTRAVASCULAR PRESSURE WIRE/FFR STUDY N/A 05/24/2017   Procedure: INTRAVASCULAR PRESSURE WIRE/FFR STUDY;  Surgeon: Nigel Mormon, MD;  Location: [place] [facility];  Service: Cardiovascular;  Laterality: N/A;  MID LAD  . KNEE ARTHROSCOPY Right 2016  . LEFT HEART CATH AND CORONARY ANGIOGRAPHY N/A 05/24/2017   Procedure: LEFT HEART CATH AND  CORONARY ANGIOGRAPHY;  Surgeon: Nigel Mormon, MD;  Location: [place] [facility];  Service: Cardiovascular;  Laterality: N/A;  . UMBILICAL HERNIA REPAIR  1992    MEDICATIONS: . ACCU-CHEK SOFTCLIX LANCETS lancets  . alfuzosin (UROXATRAL) 10 MG 24 hr tablet  . allopurinol (ZYLOPRIM) 300 MG tablet  . aspirin EC 81 MG tablet  . benzonatate (TESSALON PERLES) 100 MG capsule  . doxycycline (VIBRAMYCIN) 100 MG capsule  . finasteride (PROSCAR) 5 MG tablet  . glucose blood (ACCU-CHEK AVIVA) test strip  . metoprolol succinate (TOPROL-XL) 25 MG 24 hr tablet  . nitroGLYCERIN (NITROSTAT) 0.4 MG SL tablet  . ramipril (ALTACE) 5 MG capsule  . ranolazine (RANEXA) 500 MG 12 hr tablet  . REPATHA SURECLICK [id number] MG/ML SOAJ   No current facility-administered medications for this encounter.    Maia Plan [facility] Pre-Surgical Testing [phone] 10/22/19  10:39 AM

## 2019-10-22 NOTE — Anesthesia Preprocedure Evaluation (Addendum)
Anesthesia Evaluation  Patient identified by MRN, date of birth, ID band Patient awake    Reviewed: Allergy & Precautions, H&P , NPO status , Patient's Chart, lab work & pertinent test results  Airway Mallampati: II  TM Distance: >3 FB Neck ROM: Full    Dental no notable dental hx. (+) Teeth Intact, Dental Advisory Given   Pulmonary neg pulmonary ROS, former smoker,    Pulmonary exam normal breath sounds clear to auscultation       Cardiovascular Exercise Tolerance: Good hypertension, Pt. on medications + CAD, + Past MI and + Cardiac Stents  negative cardio ROS Normal cardiovascular exam Rhythm:Regular Rate:Normal  Echocardiogram 05/15/2017: Left ventricle cavity is normal in size. Left ventricle regional wall motion findings: Basal anterolateral, Basal inferolateral, Mid anterolateral and Mid inferolateral hypokinesis. Calculated EF 55%. Structurally normal mitral valve with moderate (Grade II) regurgitation.  CATH 8/18  Procedures: 1. Selective left and right coronary angiography 2. Left heart catheterization 3. Fractional flow reserve (FFR)  LAD 0.77 4. Successful PTCA and DES placement Onyx 4.0 X 22 mm Mid left circumflex     (99%-->0%, TIMI I - TIMI III) 5. Successful complex PTCA and DES placement Onyx 2.75 X 15 mm Mid LAD     (60%-->0%, TIMI III - TIMI III) 6. Conscious sedation monitoring 138 min   Neuro/Psych negative neurological ROS  negative psych ROS   GI/Hepatic negative GI ROS, Neg liver ROS,   Endo/Other  negative endocrine ROSdiabetes, Type 2  Renal/GU negative Renal ROS  negative genitourinary   Musculoskeletal negative musculoskeletal ROS (+) Arthritis , Osteoarthritis,    Abdominal   Peds negative pediatric ROS (+)  Hematology negative hematology ROS (+)   Anesthesia Other Findings   Reproductive/Obstetrics negative OB ROS                            Anesthesia  Physical Anesthesia Plan  ASA: III  Anesthesia Plan: MAC and Spinal   Post-op Pain Management:    Induction:   PONV Risk Score and Plan: 2 and Ondansetron, Treatment may vary due to age or medical condition and Midazolam  Airway Management Planned: Simple Face Mask, Mask and Nasal Cannula  Additional Equipment:   Intra-op Plan:   Post-operative Plan: Extubation in OR  Informed Consent: I have reviewed the patients History and Physical, chart, labs and discussed the procedure including the risks, benefits and alternatives for the proposed anesthesia with the patient or authorized representative who has indicated his/her understanding and acceptance.     Dental advisory given  Plan Discussed with: Anesthesiologist, Surgeon and CRNA  Anesthesia Plan Comments: (Discussed both nerve block for pain relief post-op and GA; including NV, sore throat, dental injury, and pulmonary complications)      Anesthesia Quick Evaluation

## 2019-10-23 ENCOUNTER — Encounter (HOSPITAL_COMMUNITY)
Admission: RE | Disposition: A | Discharge status: Home / Self Care | Payer: Self-pay | Source: Home / Self Care | Attending: Orthopedic Surgery

## 2019-10-23 ENCOUNTER — Ambulatory Visit (HOSPITAL_COMMUNITY): Payer: PPO | Admitting: Physician Assistant

## 2019-10-23 ENCOUNTER — Ambulatory Visit (HOSPITAL_COMMUNITY)
Admission: RE | Admit: 2019-10-23 | Discharge: 2019-10-23 | Disposition: A | Discharge status: Home / Self Care | Payer: PPO | Attending: Orthopedic Surgery | Admitting: Orthopedic Surgery

## 2019-10-23 ENCOUNTER — Encounter (HOSPITAL_COMMUNITY): Payer: Self-pay | Admitting: Orthopedic Surgery

## 2019-10-23 DIAGNOSIS — Z79899 Other long term (current) drug therapy: Secondary | ICD-10-CM | POA: Insufficient documentation

## 2019-10-23 DIAGNOSIS — N400 Enlarged prostate without lower urinary tract symptoms: Secondary | ICD-10-CM | POA: Diagnosis not present

## 2019-10-23 DIAGNOSIS — M1711 Unilateral primary osteoarthritis, right knee: Secondary | ICD-10-CM | POA: Insufficient documentation

## 2019-10-23 DIAGNOSIS — M109 Gout, unspecified: Secondary | ICD-10-CM | POA: Insufficient documentation

## 2019-10-23 DIAGNOSIS — Z7982 Long term (current) use of aspirin: Secondary | ICD-10-CM | POA: Diagnosis not present

## 2019-10-23 DIAGNOSIS — I252 Old myocardial infarction: Secondary | ICD-10-CM | POA: Diagnosis not present

## 2019-10-23 DIAGNOSIS — Z8249 Family history of ischemic heart disease and other diseases of the circulatory system: Secondary | ICD-10-CM | POA: Diagnosis not present

## 2019-10-23 DIAGNOSIS — E119 Type 2 diabetes mellitus without complications: Secondary | ICD-10-CM | POA: Diagnosis not present

## 2019-10-23 DIAGNOSIS — E785 Hyperlipidemia, unspecified: Secondary | ICD-10-CM | POA: Insufficient documentation

## 2019-10-23 DIAGNOSIS — I10 Essential (primary) hypertension: Secondary | ICD-10-CM | POA: Diagnosis not present

## 2019-10-23 DIAGNOSIS — Z955 Presence of coronary angioplasty implant and graft: Secondary | ICD-10-CM | POA: Diagnosis not present

## 2019-10-23 DIAGNOSIS — I2510 Atherosclerotic heart disease of native coronary artery without angina pectoris: Secondary | ICD-10-CM | POA: Diagnosis not present

## 2019-10-23 DIAGNOSIS — Z96651 Presence of right artificial knee joint: Secondary | ICD-10-CM

## 2019-10-23 DIAGNOSIS — Z87891 Personal history of nicotine dependence: Secondary | ICD-10-CM | POA: Insufficient documentation

## 2019-10-23 DIAGNOSIS — G8918 Other acute postprocedural pain: Secondary | ICD-10-CM | POA: Diagnosis not present

## 2019-10-23 HISTORY — PX: TOTAL KNEE ARTHROPLASTY: SHX125

## 2019-10-23 LAB — GLUCOSE, CAPILLARY
Glucose-Capillary: 144 mg/dL — ABNORMAL HIGH (ref 70–99)
Glucose-Capillary: 152 mg/dL — ABNORMAL HIGH (ref 70–99)

## 2019-10-23 LAB — TYPE AND SCREEN
ABO/RH(D): O POS
Antibody Screen: NEGATIVE

## 2019-10-23 SURGERY — ARTHROPLASTY, KNEE, TOTAL
Anesthesia: Monitor Anesthesia Care | Site: Knee | Laterality: Right

## 2019-10-23 MED ORDER — SODIUM CHLORIDE 0.9 % IR SOLN
Status: DC | PRN
  Administered 2019-10-23: 1000 mL

## 2019-10-23 MED ORDER — FENTANYL CITRATE (PF) 100 MCG/2ML IJ SOLN
INTRAMUSCULAR | Status: DC | PRN
  Administered 2019-10-23: 50 ug via INTRAVENOUS
  Administered 2019-10-23: 25 ug via INTRAVENOUS

## 2019-10-23 MED ORDER — CEFAZOLIN SODIUM-DEXTROSE 2-4 GM/100ML-% IV SOLN
2.0000 g | Freq: Four times a day (QID) | INTRAVENOUS | Status: DC
  Administered 2019-10-23: 2 g via INTRAVENOUS

## 2019-10-23 MED ORDER — PROPOFOL 500 MG/50ML IV EMUL
INTRAVENOUS | Status: AC
  Filled 2019-10-23: qty 100

## 2019-10-23 MED ORDER — LIDOCAINE 2% (20 MG/ML) 5 ML SYRINGE
INTRAMUSCULAR | Status: AC
  Filled 2019-10-23: qty 5

## 2019-10-23 MED ORDER — DOCUSATE SODIUM 100 MG PO CAPS: 100.0000 mg | ORAL_CAPSULE | Freq: Two times a day (BID) | ORAL | 0 refills | Status: DC

## 2019-10-23 MED ORDER — MIDAZOLAM HCL 2 MG/2ML IJ SOLN
INTRAMUSCULAR | Status: AC
  Filled 2019-10-23: qty 2

## 2019-10-23 MED ORDER — PHENYLEPHRINE HCL (PRESSORS) 10 MG/ML IV SOLN
INTRAVENOUS | Status: AC
  Filled 2019-10-23: qty 1

## 2019-10-23 MED ORDER — EPHEDRINE 5 MG/ML INJ
INTRAVENOUS | Status: AC
  Filled 2019-10-23: qty 10

## 2019-10-23 MED ORDER — FENTANYL CITRATE (PF) 100 MCG/2ML IJ SOLN
INTRAMUSCULAR | Status: AC
  Filled 2019-10-23: qty 2

## 2019-10-23 MED ORDER — SODIUM CHLORIDE (PF) 0.9 % IJ SOLN
INTRAMUSCULAR | Status: AC
  Filled 2019-10-23: qty 10

## 2019-10-23 MED ORDER — FERROUS SULFATE 325 (65 FE) MG PO TABS: 325.0000 mg | ORAL_TABLET | Freq: Three times a day (TID) | ORAL | 0 refills | Status: DC

## 2019-10-23 MED ORDER — TRANEXAMIC ACID-NACL 1000-0.7 MG/100ML-% IV SOLN
INTRAVENOUS | Status: AC
  Filled 2019-10-23: qty 100

## 2019-10-23 MED ORDER — PHENYLEPHRINE 40 MCG/ML (10ML) SYRINGE FOR IV PUSH (FOR BLOOD PRESSURE SUPPORT)
PREFILLED_SYRINGE | INTRAVENOUS | Status: DC | PRN
  Administered 2019-10-23 (×5): 40 ug via INTRAVENOUS

## 2019-10-23 MED ORDER — TRANEXAMIC ACID-NACL 1000-0.7 MG/100ML-% IV SOLN
1000.0000 mg | INTRAVENOUS | Status: AC
  Administered 2019-10-23: 1000 mg via INTRAVENOUS

## 2019-10-23 MED ORDER — POLYETHYLENE GLYCOL 3350 17 G PO PACK: 17.0000 g | PACK | Freq: Two times a day (BID) | ORAL | 0 refills | Status: DC

## 2019-10-23 MED ORDER — DEXAMETHASONE SODIUM PHOSPHATE 10 MG/ML IJ SOLN
INTRAMUSCULAR | Status: AC
  Filled 2019-10-23: qty 1

## 2019-10-23 MED ORDER — CEFAZOLIN SODIUM-DEXTROSE 2-4 GM/100ML-% IV SOLN
INTRAVENOUS | Status: AC
  Filled 2019-10-23: qty 100

## 2019-10-23 MED ORDER — LACTATED RINGERS IV SOLN: INTRAVENOUS | Status: DC

## 2019-10-23 MED ORDER — BUPIVACAINE HCL (PF) 0.25 % IJ SOLN
INTRAMUSCULAR | Status: AC
  Filled 2019-10-23: qty 30

## 2019-10-23 MED ORDER — PROPOFOL 500 MG/50ML IV EMUL
INTRAVENOUS | Status: DC | PRN
  Administered 2019-10-23: 100 ug/kg/min via INTRAVENOUS

## 2019-10-23 MED ORDER — BUPIVACAINE HCL (PF) 0.25 % IJ SOLN
INTRAMUSCULAR | Status: DC | PRN
  Administered 2019-10-23: 30 mL

## 2019-10-23 MED ORDER — PHENYLEPHRINE HCL-NACL 10-0.9 MG/250ML-% IV SOLN
INTRAVENOUS | Status: DC | PRN
  Administered 2019-10-23: 20 ug/min via INTRAVENOUS

## 2019-10-23 MED ORDER — ONDANSETRON HCL 4 MG/2ML IJ SOLN
INTRAMUSCULAR | Status: AC
  Filled 2019-10-23: qty 2

## 2019-10-23 MED ORDER — DEXAMETHASONE SODIUM PHOSPHATE 10 MG/ML IJ SOLN
10.0000 mg | Freq: Once | INTRAMUSCULAR | Status: AC
  Administered 2019-10-23: 10 mg via INTRAVENOUS

## 2019-10-23 MED ORDER — LACTATED RINGERS IV BOLUS
250.0000 mL | Freq: Once | INTRAVENOUS | Status: AC
  Administered 2019-10-23: 250 mL via INTRAVENOUS

## 2019-10-23 MED ORDER — OXYCODONE HCL 5 MG/5ML PO SOLN: 5.0000 mg | Freq: Once | ORAL | Status: DC | PRN

## 2019-10-23 MED ORDER — MEPERIDINE HCL 50 MG/ML IJ SOLN: 6.2500 mg | INTRAMUSCULAR | Status: DC | PRN

## 2019-10-23 MED ORDER — SODIUM CHLORIDE (PF) 0.9 % IJ SOLN
INTRAMUSCULAR | Status: AC
  Filled 2019-10-23: qty 20

## 2019-10-23 MED ORDER — ACETAMINOPHEN 325 MG PO TABS: 325.0000 mg | ORAL_TABLET | ORAL | Status: DC | PRN

## 2019-10-23 MED ORDER — FENTANYL CITRATE (PF) 100 MCG/2ML IJ SOLN: 25.0000 ug | INTRAMUSCULAR | Status: DC | PRN

## 2019-10-23 MED ORDER — ONDANSETRON HCL 4 MG/2ML IJ SOLN: 4.0000 mg | Freq: Once | INTRAMUSCULAR | Status: DC | PRN

## 2019-10-23 MED ORDER — TRANEXAMIC ACID-NACL 1000-0.7 MG/100ML-% IV SOLN
1000.0000 mg | Freq: Once | INTRAVENOUS | Status: AC
  Administered 2019-10-23: 1000 mg via INTRAVENOUS

## 2019-10-23 MED ORDER — POVIDONE-IODINE 10 % EX SWAB
2.0000 "application " | Freq: Once | CUTANEOUS | Status: AC
  Administered 2019-10-23: 2 via TOPICAL

## 2019-10-23 MED ORDER — STERILE WATER FOR IRRIGATION IR SOLN
Status: DC | PRN
  Administered 2019-10-23: 2000 mL

## 2019-10-23 MED ORDER — CEFAZOLIN SODIUM-DEXTROSE 2-4 GM/100ML-% IV SOLN
2.0000 g | INTRAVENOUS | Status: AC
  Administered 2019-10-23: 2 g via INTRAVENOUS

## 2019-10-23 MED ORDER — LACTATED RINGERS IV BOLUS
500.0000 mL | Freq: Once | INTRAVENOUS | Status: AC
  Administered 2019-10-23: 500 mL via INTRAVENOUS

## 2019-10-23 MED ORDER — METHOCARBAMOL 500 MG PO TABS: 500.0000 mg | ORAL_TABLET | Freq: Four times a day (QID) | ORAL | 0 refills | Status: DC | PRN

## 2019-10-23 MED ORDER — ACETAMINOPHEN 160 MG/5ML PO SOLN: 325.0000 mg | ORAL | Status: DC | PRN

## 2019-10-23 MED ORDER — ONDANSETRON HCL 4 MG/2ML IJ SOLN
INTRAMUSCULAR | Status: DC | PRN
  Administered 2019-10-23: 4 mg via INTRAVENOUS

## 2019-10-23 MED ORDER — DEXAMETHASONE SODIUM PHOSPHATE 10 MG/ML IJ SOLN
INTRAMUSCULAR | Status: DC | PRN
  Administered 2019-10-23: 10 mg

## 2019-10-23 MED ORDER — SODIUM CHLORIDE (PF) 0.9 % IJ SOLN
INTRAMUSCULAR | Status: DC | PRN
  Administered 2019-10-23: 30 mL

## 2019-10-23 MED ORDER — HYDROCODONE-ACETAMINOPHEN 7.5-325 MG PO TABS: 1.0000 | ORAL_TABLET | ORAL | 0 refills | Status: DC | PRN

## 2019-10-23 MED ORDER — PHENYLEPHRINE 40 MCG/ML (10ML) SYRINGE FOR IV PUSH (FOR BLOOD PRESSURE SUPPORT)
PREFILLED_SYRINGE | INTRAVENOUS | Status: AC
  Filled 2019-10-23: qty 10

## 2019-10-23 MED ORDER — EPHEDRINE SULFATE-NACL 50-0.9 MG/10ML-% IV SOSY
PREFILLED_SYRINGE | INTRAVENOUS | Status: DC | PRN
  Administered 2019-10-23 (×2): 10 mg via INTRAVENOUS
  Administered 2019-10-23 (×2): 5 mg via INTRAVENOUS
  Administered 2019-10-23 (×2): 10 mg via INTRAVENOUS

## 2019-10-23 MED ORDER — CHLORHEXIDINE GLUCONATE 4 % EX LIQD: 60.0000 mL | Freq: Once | CUTANEOUS | Status: DC

## 2019-10-23 MED ORDER — ROPIVACAINE HCL 7.5 MG/ML IJ SOLN
INTRAMUSCULAR | Status: DC | PRN
  Administered 2019-10-23: 30 mL via PERINEURAL

## 2019-10-23 MED ORDER — PROPOFOL 10 MG/ML IV BOLUS
INTRAVENOUS | Status: AC
  Filled 2019-10-23: qty 20

## 2019-10-23 MED ORDER — ASPIRIN 81 MG PO CHEW: 81.0000 mg | CHEWABLE_TABLET | Freq: Two times a day (BID) | ORAL | 0 refills | Status: AC

## 2019-10-23 MED ORDER — MIDAZOLAM HCL 2 MG/2ML IJ SOLN
INTRAMUSCULAR | Status: DC | PRN
  Administered 2019-10-23 (×2): 1 mg via INTRAVENOUS

## 2019-10-23 MED ORDER — KETOROLAC TROMETHAMINE 30 MG/ML IJ SOLN
INTRAMUSCULAR | Status: AC
  Filled 2019-10-23: qty 1

## 2019-10-23 MED ORDER — KETOROLAC TROMETHAMINE 30 MG/ML IJ SOLN
INTRAMUSCULAR | Status: DC | PRN
  Administered 2019-10-23: 30 mg

## 2019-10-23 MED ORDER — OXYCODONE HCL 5 MG PO TABS: 5.0000 mg | ORAL_TABLET | Freq: Once | ORAL | Status: DC | PRN

## 2019-10-23 SURGICAL SUPPLY — 64 items
ATTUNE MED ANAT PAT 41 KNEE (Knees) ×2 IMPLANT
ATTUNE MED ANAT PAT 41MM KNEE (Knees) ×1 IMPLANT
ATTUNE PS FEM RT SZ 6 CEM KNEE (Femur) ×3 IMPLANT
ATTUNE PSRP INSR SZ6 6 KNEE (Insert) ×2 IMPLANT
ATTUNE PSRP INSR SZ6 6MM KNEE (Insert) ×1 IMPLANT
BAG ZIPLOCK 12X15 (MISCELLANEOUS) IMPLANT
BASE TIBIAL ROT PLAT SZ 7 KNEE (Knees) ×1 IMPLANT
BLADE SAW SGTL 11.0X1.19X90.0M (BLADE) IMPLANT
BLADE SAW SGTL 13.0X1.19X90.0M (BLADE) ×3 IMPLANT
BLADE SURG SZ10 CARB STEEL (BLADE) ×6 IMPLANT
BNDG ELASTIC 6X15 VLCR STRL LF (GAUZE/BANDAGES/DRESSINGS) ×3 IMPLANT
BNDG ELASTIC 6X5.8 VLCR STR LF (GAUZE/BANDAGES/DRESSINGS) ×3 IMPLANT
BOWL SMART MIX CTS (DISPOSABLE) ×3 IMPLANT
CEMENT HV SMART SET (Cement) ×6 IMPLANT
COVER SURGICAL LIGHT HANDLE (MISCELLANEOUS) ×3 IMPLANT
COVER WAND RF STERILE (DRAPES) IMPLANT
CUFF TOURN SGL QUICK 34 (TOURNIQUET CUFF) ×2
CUFF TRNQT CYL 34X4.125X (TOURNIQUET CUFF) ×1 IMPLANT
DECANTER SPIKE VIAL GLASS SM (MISCELLANEOUS) ×6 IMPLANT
DERMABOND ADVANCED (GAUZE/BANDAGES/DRESSINGS) ×2
DERMABOND ADVANCED .7 DNX12 (GAUZE/BANDAGES/DRESSINGS) ×1 IMPLANT
DRAPE U-SHAPE 47X51 STRL (DRAPES) ×3 IMPLANT
DRESSING AQUACEL AG SP 3.5X10 (GAUZE/BANDAGES/DRESSINGS) ×1 IMPLANT
DRSG AQUACEL AG ADV 3.5X10 (GAUZE/BANDAGES/DRESSINGS) ×3 IMPLANT
DRSG AQUACEL AG SP 3.5X10 (GAUZE/BANDAGES/DRESSINGS) ×3
DURAPREP 26ML APPLICATOR (WOUND CARE) ×6 IMPLANT
ELECT REM PT RETURN 15FT ADLT (MISCELLANEOUS) ×3 IMPLANT
GLOVE BIO SURGEON STRL SZ 6 (GLOVE) ×3 IMPLANT
GLOVE BIOGEL PI IND STRL 6.5 (GLOVE) ×1 IMPLANT
GLOVE BIOGEL PI IND STRL 7.5 (GLOVE) ×1 IMPLANT
GLOVE BIOGEL PI IND STRL 8.5 (GLOVE) ×1 IMPLANT
GLOVE BIOGEL PI INDICATOR 6.5 (GLOVE) ×2
GLOVE BIOGEL PI INDICATOR 7.5 (GLOVE) ×2
GLOVE BIOGEL PI INDICATOR 8.5 (GLOVE) ×2
GLOVE ECLIPSE 8.0 STRL XLNG CF (GLOVE) ×3 IMPLANT
GLOVE ORTHO TXT STRL SZ7.5 (GLOVE) ×3 IMPLANT
GOWN STRL REUS W/ TWL LRG LVL3 (GOWN DISPOSABLE) ×1 IMPLANT
GOWN STRL REUS W/TWL 2XL LVL3 (GOWN DISPOSABLE) ×3 IMPLANT
GOWN STRL REUS W/TWL LRG LVL3 (GOWN DISPOSABLE) ×5 IMPLANT
HANDPIECE INTERPULSE COAX TIP (DISPOSABLE) ×2
HOLDER FOLEY CATH W/STRAP (MISCELLANEOUS) IMPLANT
KIT TURNOVER KIT A (KITS) IMPLANT
MANIFOLD NEPTUNE II (INSTRUMENTS) ×3 IMPLANT
NDL SAFETY ECLIPSE 18X1.5 (NEEDLE) IMPLANT
NEEDLE HYPO 18GX1.5 SHARP (NEEDLE)
NS IRRIG 1000ML POUR BTL (IV SOLUTION) ×3 IMPLANT
PACK TOTAL KNEE CUSTOM (KITS) ×3 IMPLANT
PENCIL SMOKE EVACUATOR (MISCELLANEOUS) IMPLANT
PIN DRILL FIX HALF THREAD (BIT) ×3 IMPLANT
PIN FIX SIGMA LCS THRD HI (PIN) ×3 IMPLANT
PROTECTOR NERVE ULNAR (MISCELLANEOUS) ×3 IMPLANT
SET HNDPC FAN SPRY TIP SCT (DISPOSABLE) ×1 IMPLANT
SET PAD KNEE POSITIONER (MISCELLANEOUS) ×3 IMPLANT
SUT MNCRL AB 4-0 PS2 18 (SUTURE) ×3 IMPLANT
SUT STRATAFIX PDS+ 0 24IN (SUTURE) ×3 IMPLANT
SUT VIC AB 1 CT1 36 (SUTURE) ×3 IMPLANT
SUT VIC AB 2-0 CT1 27 (SUTURE) ×6
SUT VIC AB 2-0 CT1 TAPERPNT 27 (SUTURE) ×3 IMPLANT
SYR 3ML LL SCALE MARK (SYRINGE) ×3 IMPLANT
TIBIAL BASE ROT PLAT SZ 7 KNEE (Knees) ×3 IMPLANT
TRAY FOLEY MTR SLVR 16FR STAT (SET/KITS/TRAYS/PACK) ×3 IMPLANT
WATER STERILE IRR 1000ML POUR (IV SOLUTION) ×6 IMPLANT
WRAP KNEE MAXI GEL POST OP (GAUZE/BANDAGES/DRESSINGS) ×3 IMPLANT
YANKAUER SUCT BULB TIP 10FT TU (MISCELLANEOUS) ×3 IMPLANT

## 2019-10-23 NOTE — Anesthesia Procedure Notes (Signed)
Procedure Name: MAC Date/Time: 10/23/2019 7:28 AM Performed by: Eben Burow, CRNA Pre-anesthesia Checklist: Patient identified, Emergency Drugs available, Suction available, Patient being monitored and Timeout performed Oxygen Delivery Method: Simple face mask Dental Injury: Teeth and Oropharynx as per pre-operative assessment

## 2019-10-23 NOTE — Discharge Instructions (Signed)

## 2019-10-23 NOTE — Anesthesia Postprocedure Evaluation (Signed)
Anesthesia Post Note  Patient: Nicholas Maxwell  Procedure(s) Performed: TOTAL KNEE ARTHROPLASTY (Right Knee)     Patient location during evaluation: PACU Anesthesia Type: MAC Level of consciousness: awake and alert Pain management: pain level controlled Vital Signs Assessment: post-procedure vital signs reviewed and stable Respiratory status: spontaneous breathing, nonlabored ventilation, respiratory function stable and patient connected to nasal cannula oxygen Cardiovascular status: stable and blood pressure returned to baseline Postop Assessment: no apparent nausea or vomiting Anesthetic complications: no    Last Vitals:  Vitals:   10/23/19 1345 10/23/19 1400  BP:  118/71  Pulse: 73 73  Resp:    Temp:    SpO2: 99% 98%    Last Pain:  Vitals:   10/23/19 1345  TempSrc:   PainSc: 0-No pain                 Dashayla Theissen

## 2019-10-23 NOTE — Op Note (Signed)
NAME:  Nicholas Maxwell                      MEDICAL RECORD NO.:  [id number]                             FACILITY:  [facility]      PHYSICIAN:  Pietro Cassis. Alvan Dame, M.D.  DATE OF BIRTH:  1946/09/14      DATE OF PROCEDURE:  10/23/2019                                     OPERATIVE REPORT         PREOPERATIVE DIAGNOSIS:  Right knee osteoarthritis.      POSTOPERATIVE DIAGNOSIS:  Right knee osteoarthritis.      FINDINGS:  The patient was noted to have complete loss of cartilage and   bone-on-bone arthritis with associated osteophytes in the medial and patellofemoral compartments of   the knee with a significant synovitis and associated effusion.  The patient had failed months of conservative treatment including medications, injection therapy, activity modification.     PROCEDURE:  Right total knee replacement.      COMPONENTS USED:  DePuy Attune rotating platform posterior stabilized knee   system, a size 6 femur, 7 tibia, size 6 mm PS AOX insert, and 41 anatomic patellar   button.      SURGEON:  Pietro Cassis. Alvan Dame, M.D.      ASSISTANT:  Griffith Citron, PA-C.      ANESTHESIA:  Regional and Spinal.      SPECIMENS:  None.      COMPLICATION:  None.      DRAINS:  None.  EBL: <100cc      TOURNIQUET TIME:   Total Tourniquet Time Documented: Thigh (Right) - 31 minutes Total: Thigh (Right) - 31 minutes  .      The patient was stable to the recovery room.      INDICATION FOR PROCEDURE:  Nicholas Maxwell is a 74 y.o. male patient of   mine.  The patient had been seen, evaluated, and treated for months conservatively in the   office with medication, activity modification, and injections.  The patient had   radiographic changes of bone-on-bone arthritis with endplate sclerosis and osteophytes noted.  Based on the radiographic changes and failed conservative measures, the patient   decided to proceed with definitive treatment, total knee replacement.  Risks of infection, DVT, component failure,  need for revision surgery, neurovascular injury were reviewed in the office setting.  The postop course was reviewed stressing the efforts to maximize post-operative satisfaction and function.  Consent was obtained for benefit of pain   relief.      PROCEDURE IN DETAIL:  The patient was brought to the operative theater.   Once adequate anesthesia, preoperative antibiotics, 2 gm of Ancef,1 gm of Tranexamic Acid, and 10 mg of Decadron administered, the patient was positioned supine with a right thigh tourniquet placed.  The  right lower extremity was prepped and draped in sterile fashion.  A time-   out was performed identifying the patient, planned procedure, and the appropriate extremity.      The right lower extremity was placed in the [facility] leg holder.  The leg was   exsanguinated, tourniquet elevated to 250 mmHg.  A midline incision was   made  followed by median parapatellar arthrotomy.  Following initial   exposure, attention was first directed to the patella.  Precut   measurement was noted to be 24 mm.  I resected down to 14 mm and used a   41 anatomic patellar button to restore patellar height as well as cover the cut surface.      The lug holes were drilled and a metal shim was placed to protect the   patella from retractors and saw blade during the procedure.      At this point, attention was now directed to the femur.  The femoral   canal was opened with a drill, irrigated to try to prevent fat emboli.  An   intramedullary rod was passed at 5 degrees valgus, 9 mm of bone was   resected off the distal femur.  Following this resection, the tibia was   subluxated anteriorly.  Using the extramedullary guide, 3 mm of bone was resected off   the proximal medial tibia.  We confirmed the gap would be   stable medially and laterally with a size 5 spacer block as well as confirmed that the tibial cut was perpendicular in the coronal plane, checking with an alignment rod.      Once this was  done, I sized the femur to be a size 6 in the anterior-   posterior dimension, chose a standard component based on medial and   lateral dimension.  The size 6 rotation block was then pinned in   position anterior referenced using the C-clamp to set rotation.  The   anterior, posterior, and  chamfer cuts were made without difficulty nor   notching making certain that I was along the anterior cortex to help   with flexion gap stability.      The final box cut was made off the lateral aspect of distal femur.      At this point, the tibia was sized to be a size 7.  The size 7 tray was   then pinned in position through the medial third of the tubercle,   drilled, and keel punched.  Trial reduction was now carried with a 6 femur,  7 tibia, a size 6 mm PS insert, and the 41 anatomic patella botton.  The knee was brought to full extension with good flexion stability with the patella   tracking through the trochlea without application of pressure.  Given   all these findings the trial components removed.  Final components were   opened and cement was mixed.  The knee was irrigated with normal saline solution and pulse lavage.  The synovial lining was   then injected with 30 cc of 0.25% Marcaine with epinephrine, 1 cc of Toradol and 30 cc of NS for a total of 61 cc.     Final implants were then cemented onto cleaned and dried cut surfaces of bone with the knee brought to extension with a size 6 mm PS trial insert.      Once the cement had fully cured, excess cement was removed   throughout the knee.  I confirmed that I was satisfied with the range of   motion and stability, and the final size 6 mm PS AOX insert was chosen.  It was   placed into the knee.      The tourniquet had been let down at 31 minutes.  No significant   hemostasis was required.  The extensor mechanism was then reapproximated using #1 Vicryl and #  1 Stratafix sutures with the knee   in flexion.  The   remaining wound was closed  with 2-0 Vicryl and running 4-0 Monocryl.   The knee was cleaned, dried, dressed sterilely using Dermabond and   Aquacel dressing.  The patient was then   brought to recovery room in stable condition, tolerating the procedure   well.   Please note that Physician Assistant, Griffith Citron, PA-C was present for the entirety of the case, and was utilized for pre-operative positioning, peri-operative retractor management, general facilitation of the procedure and for primary wound closure at the end of the case.              Pietro Cassis Alvan Dame, M.D.    10/23/2019 8:52 AM

## 2019-10-23 NOTE — Transfer of Care (Signed)
Immediate Anesthesia Transfer of Care Note  Patient: Nicholas Maxwell  Procedure(s) Performed: TOTAL KNEE ARTHROPLASTY (Right Knee)  Patient Location: PACU  Anesthesia Type:Spinal  Level of Consciousness: awake  Airway & Oxygen Therapy: Patient Spontanous Breathing and Patient connected to face mask oxygen  Post-op Assessment: Report given to RN and Post -op Vital signs reviewed and stable  Post vital signs: Reviewed and stable  Last Vitals:  Vitals Value Taken Time  BP 123/69 10/23/19 0920  Temp    Pulse 63 10/23/19 0921  Resp 17 10/23/19 0921  SpO2 100 % 10/23/19 0921  Vitals shown include unvalidated device data.  Last Pain:  Vitals:   10/23/19 0604  TempSrc: Oral  PainSc:          Complications: No apparent anesthesia complications

## 2019-10-23 NOTE — Anesthesia Procedure Notes (Signed)
Anesthesia Regional Block: Adductor canal block   Pre-Anesthetic Checklist: ,, timeout performed, Correct Patient, Correct Site, Correct Laterality, Correct Procedure, Correct Position, site marked, Risks and benefits discussed,  Surgical consent,  Pre-op evaluation,  At surgeon's request and post-op pain management  Laterality: Right  Prep: chloraprep       Needles:  Injection technique: Single-shot  Needle Type: Echogenic Stimulator Needle     Needle Length: 5cm  Needle Gauge: 22     Additional Needles:   Procedures:, nerve stimulator,,, ultrasound used (permanent image in chart),,,,  Narrative:  Start time: 10/23/2019 6:57 AM End time: 10/23/2019 7:00 AM Injection made incrementally with aspirations every 5 mL.  Performed by: Personally  Anesthesiologist: Janeece Riggers, MD  Additional Notes: Functioning IV was confirmed and monitors were applied.  A 69mm 22ga Arrow echogenic stimulator needle was used. Sterile prep and drape,hand hygiene and sterile gloves were used. Ultrasound guidance: relevant anatomy identified, needle position confirmed, local anesthetic spread visualized around nerve(s)., vascular puncture avoided.  Image printed for medical record. Negative aspiration and negative test dose prior to incremental administration of local anesthetic. The patient tolerated the procedure well.

## 2019-10-23 NOTE — Progress Notes (Signed)
Pt ate 100% lunch.

## 2019-10-23 NOTE — Interval H&P Note (Signed)
History and Physical Interval Note:  10/23/2019 7:18 AM  Nicholas Maxwell  has presented today for surgery, with the diagnosis of Right knee osteoarthritis.  The various methods of treatment have been discussed with the patient and family. After consideration of risks, benefits and other options for treatment, the patient has consented to  Procedure(s) with comments: TOTAL KNEE ARTHROPLASTY (Right) - 70 mins as a surgical intervention.  The patient's history has been reviewed, patient examined, no change in status, stable for surgery.  I have reviewed the patient's chart and labs.  Questions were answered to the patient's satisfaction.     Mauri Pole

## 2019-10-23 NOTE — Evaluation (Signed)
Physical Therapy Evaluation Patient Details Name: Nicholas Maxwell MRN: [id number] DOB: Jan 21, 1946 Today's Date: 10/23/2019   History of Present Illness  Pt s/p R TKR and with hx of MI  Clinical Impression  Pt s/p R TKR and presents with decreased R LE strength/ROM and post op pain limiting functional mobility.  Pt performed HEP and navigated stairs with assist and with written instruction provided.  Pt currently mobilizing at [place] guard/supervision assist with RW and eager for return home.    Follow Up Recommendations Follow surgeon's recommendation for DC plan and follow-up therapies    Equipment Recommendations  Rolling walker with 5" wheels;3in1 (PT)    Recommendations for Other Services       Precautions / Restrictions Precautions Precautions: Knee;Fall Restrictions Weight Bearing Restrictions: No Other Position/Activity Restrictions: WBAT      Mobility  Bed Mobility Overal bed mobility: Needs Assistance Bed Mobility: Supine to Sit     Supine to sit: Min guard     General bed mobility comments: min guard for R LE  Transfers Overall transfer level: Needs assistance Equipment used: Rolling walker (2 wheeled) Transfers: Sit to/from Stand Sit to Stand: Min guard;Supervision         General transfer comment: cues for LE management and use of UES to self assist  Ambulation/Gait Ambulation/Gait assistance: Min guard;Supervision Gait Distance (Feet): 150 Feet Assistive device: Rolling walker (2 wheeled) Gait Pattern/deviations: Step-to pattern;Decreased step length - right;Decreased step length - left;Shuffle;Trunk flexed Gait velocity: decr   General Gait Details: cues for sequence, posture and position from RW  Stairs Stairs: Yes Stairs assistance: Min assist Stair Management: No rails;Step to pattern;With walker;Backwards Number of Stairs: 4 General stair comments: 2 steps twice with RW bkwd; cues for sequence  Wheelchair Mobility    Modified Rankin  (Stroke Patients Only)       Balance Overall balance assessment: Mild deficits observed, not formally tested                                           Pertinent Vitals/Pain Pain Assessment: 0-10 Pain Score: 4  Pain Location: R knee Pain Descriptors / Indicators: Aching;Sore Pain Intervention(s): Limited activity within patient's tolerance;Monitored during session;Premedicated before session;Ice applied    Home Living Family/patient expects to be discharged to:: Private residence Living Arrangements: Spouse/significant other Available Help at Discharge: Family Type of Home: House Home Access: Stairs to enter Entrance Stairs-Rails: None Entrance Stairs-Number of Steps: 3 Home Layout: Able to live on main level with bedroom/bathroom Home Equipment: None      Prior Function Level of Independence: Independent               Hand Dominance        Extremity/Trunk Assessment   Upper Extremity Assessment Upper Extremity Assessment: Overall WFL for tasks assessed    Lower Extremity Assessment Lower Extremity Assessment: RLE deficits/detail RLE Deficits / Details: 3/5 quads with IND SLR; AAROM at knee -5 - 80    Cervical / Trunk Assessment Cervical / Trunk Assessment: Normal  Communication   Communication: No difficulties  Cognition Arousal/Alertness: Awake/alert Behavior During Therapy: WFL for tasks assessed/performed Overall Cognitive Status: Within Functional Limits for tasks assessed  General Comments      Exercises Total Joint Exercises Ankle Circles/Pumps: AROM;Both;15 reps;Supine Quad Sets: AROM;Both;10 reps;Supine Heel Slides: AAROM;Right;15 reps;Supine Straight Leg Raises: AAROM;AROM;Right;15 reps;Supine Long Arc Quad: AROM;Right;10 reps;Seated Knee Flexion: AAROM;AROM;Right;10 reps;Seated   Assessment/Plan    PT Assessment Patient needs continued PT services  PT Problem  List Decreased strength;Decreased range of motion;Decreased activity tolerance;Decreased mobility;Decreased knowledge of use of DME;Pain       PT Treatment Interventions DME instruction;Gait training;Stair training;Functional mobility training;Therapeutic activities;Therapeutic exercise;Patient/family education    PT Goals (Current goals can be found in the Care Plan section)  Acute Rehab PT Goals Patient Stated Goal: Regain IND PT Goal Formulation: All assessment and education complete, DC therapy    Frequency Min 1X/week   Barriers to discharge        Co-evaluation               AM-PAC PT "6 Clicks" Mobility  Outcome Measure Help needed turning from your back to your side while in a flat bed without using bedrails?: A Little Help needed moving from lying on your back to sitting on the side of a flat bed without using bedrails?: A Little Help needed moving to and from a bed to a chair (including a wheelchair)?: A Little Help needed standing up from a chair using your arms (e.g., wheelchair or bedside chair)?: A Little Help needed to walk in hospital room?: A Little Help needed climbing 3-5 steps with a railing? : A Little 6 Click Score: 18    End of Session Equipment Utilized During Treatment: Gait belt Activity Tolerance: Patient tolerated treatment well Patient left: in chair;with call bell/phone within reach Nurse Communication: Mobility status PT Visit Diagnosis: Difficulty in walking, not elsewhere classified (R26.2)    Time: [id number] PT Time Calculation (min) (ACUTE ONLY): 48 min   Charges:   PT Evaluation $PT Eval Low Complexity: 1 Low PT Treatments $Gait Training: 8-22 mins $Therapeutic Exercise: 8-22 mins        Debe Coder PT Acute Rehabilitation Services Pager [phone] Office [phone]   Nicholas Maxwell 10/23/2019, 6:04 PM

## 2019-10-24 ENCOUNTER — Encounter: Payer: Self-pay | Admitting: *Deleted

## 2019-10-27 DIAGNOSIS — M1711 Unilateral primary osteoarthritis, right knee: Secondary | ICD-10-CM | POA: Diagnosis not present

## 2019-10-29 DIAGNOSIS — M1711 Unilateral primary osteoarthritis, right knee: Secondary | ICD-10-CM | POA: Diagnosis not present

## 2019-10-30 DIAGNOSIS — M1711 Unilateral primary osteoarthritis, right knee: Secondary | ICD-10-CM | POA: Diagnosis not present

## 2019-11-03 DIAGNOSIS — M1711 Unilateral primary osteoarthritis, right knee: Secondary | ICD-10-CM | POA: Diagnosis not present

## 2019-11-04 ENCOUNTER — Encounter: Payer: Self-pay | Admitting: Anesthesiology

## 2019-11-04 MED ORDER — BUPIVACAINE IN DEXTROSE 0.75-8.25 % IT SOLN
INTRATHECAL | Status: DC | PRN
  Administered 2019-10-23: 1.6 mg via INTRATHECAL

## 2019-11-04 NOTE — Addendum Note (Signed)
Addendum  created 11/04/19 1110 by Janeece Riggers, MD   Child order released for a procedure order, Clinical Note Signed, Intraprocedure Blocks edited

## 2019-11-04 NOTE — Anesthesia Procedure Notes (Signed)
Spinal  Patient location during procedure: OR Start time: 10/23/2019 7:29 AM End time: 10/23/2019 7:32 AM Staffing Anesthesiologist: Janeece Riggers, MD Preanesthetic Checklist Completed: patient identified, IV checked, site marked, risks and benefits discussed, surgical consent, monitors and equipment checked, pre-op evaluation and timeout performed Spinal Block Patient position: sitting Prep: DuraPrep Patient monitoring: heart rate, cardiac monitor, continuous pulse ox and blood pressure Approach: midline Location: L3-4 Injection technique: single-shot Needle Needle type: Sprotte  Needle gauge: 24 G Needle length: 9 cm Assessment Sensory level: T4

## 2019-11-05 DIAGNOSIS — M1711 Unilateral primary osteoarthritis, right knee: Secondary | ICD-10-CM | POA: Diagnosis not present

## 2019-11-06 DIAGNOSIS — M1711 Unilateral primary osteoarthritis, right knee: Secondary | ICD-10-CM | POA: Diagnosis not present

## 2019-11-11 DIAGNOSIS — M1711 Unilateral primary osteoarthritis, right knee: Secondary | ICD-10-CM | POA: Diagnosis not present

## 2019-11-13 DIAGNOSIS — M1711 Unilateral primary osteoarthritis, right knee: Secondary | ICD-10-CM | POA: Diagnosis not present

## 2019-11-18 DIAGNOSIS — M1711 Unilateral primary osteoarthritis, right knee: Secondary | ICD-10-CM | POA: Diagnosis not present

## 2019-11-25 DIAGNOSIS — M1711 Unilateral primary osteoarthritis, right knee: Secondary | ICD-10-CM | POA: Diagnosis not present

## 2019-11-27 DIAGNOSIS — M1711 Unilateral primary osteoarthritis, right knee: Secondary | ICD-10-CM | POA: Diagnosis not present

## 2019-12-01 ENCOUNTER — Other Ambulatory Visit: Payer: Self-pay | Admitting: *Deleted

## 2019-12-01 MED ORDER — RAMIPRIL 5 MG PO CAPS: 5.0000 mg | ORAL_CAPSULE | Freq: Every day | ORAL | 1 refills | Status: DC

## 2019-12-02 DIAGNOSIS — M1711 Unilateral primary osteoarthritis, right knee: Secondary | ICD-10-CM | POA: Diagnosis not present

## 2019-12-04 DIAGNOSIS — Z96651 Presence of right artificial knee joint: Secondary | ICD-10-CM | POA: Diagnosis not present

## 2019-12-04 DIAGNOSIS — Z471 Aftercare following joint replacement surgery: Secondary | ICD-10-CM | POA: Diagnosis not present

## 2019-12-04 DIAGNOSIS — M1711 Unilateral primary osteoarthritis, right knee: Secondary | ICD-10-CM | POA: Diagnosis not present

## 2019-12-11 ENCOUNTER — Other Ambulatory Visit: Payer: Self-pay

## 2019-12-11 ENCOUNTER — Other Ambulatory Visit (INDEPENDENT_AMBULATORY_CARE_PROVIDER_SITE_OTHER): Payer: PPO

## 2019-12-11 DIAGNOSIS — D649 Anemia, unspecified: Secondary | ICD-10-CM

## 2019-12-11 LAB — B12 AND FOLATE PANEL
Folate: >23.7 ng/mL (ref >5.9)
Vitamin B-12: 104 pg/mL — ABNORMAL LOW (ref 211–911)

## 2019-12-11 LAB — CBC
HCT: 33.4 % — ABNORMAL LOW (ref 39.0–52.0)
Hemoglobin: 11.4 g/dL — ABNORMAL LOW (ref 13.0–17.0)
MCHC: 34.1 g/dL (ref 30.0–36.0)
MCV: 100.7 fl — ABNORMAL HIGH (ref 78.0–100.0)
Platelets: 224 10*3/uL (ref 150.0–400.0)
RBC: 3.32 Mil/uL — ABNORMAL LOW (ref 4.22–5.81)
RDW: 13.3 % (ref 11.5–15.5)
WBC: 6.4 10*3/uL (ref 4.0–10.5)

## 2019-12-12 ENCOUNTER — Encounter: Payer: Self-pay | Admitting: Physician Assistant

## 2019-12-12 LAB — IRON,TIBC AND FERRITIN PANEL
%SAT: 18 % (calc) — ABNORMAL LOW (ref 20–48)
Ferritin: 114 ng/mL (ref 24–380)
Iron: 56 ug/dL (ref 50–180)
TIBC: 305 mcg/dL (calc) (ref 250–425)

## 2019-12-14 ENCOUNTER — Encounter: Payer: Self-pay | Admitting: Physician Assistant

## 2019-12-15 ENCOUNTER — Encounter: Payer: Self-pay | Admitting: Nurse Practitioner

## 2019-12-15 ENCOUNTER — Other Ambulatory Visit: Payer: Self-pay | Admitting: Physician Assistant

## 2019-12-15 DIAGNOSIS — D649 Anemia, unspecified: Secondary | ICD-10-CM

## 2019-12-15 DIAGNOSIS — R82998 Other abnormal findings in urine: Secondary | ICD-10-CM

## 2019-12-24 ENCOUNTER — Encounter: Payer: Self-pay | Admitting: Nurse Practitioner

## 2019-12-24 ENCOUNTER — Ambulatory Visit: Payer: PPO | Admitting: Nurse Practitioner

## 2019-12-24 ENCOUNTER — Other Ambulatory Visit: Payer: Self-pay

## 2019-12-24 VITALS — BP 122/77 | HR 62 | Temp 98.0°F | Ht 70.0 in | Wt 200.2 lb

## 2019-12-24 DIAGNOSIS — D539 Nutritional anemia, unspecified: Secondary | ICD-10-CM

## 2019-12-24 DIAGNOSIS — Z86010 Personal history of colonic polyps: Secondary | ICD-10-CM

## 2019-12-24 NOTE — Progress Notes (Addendum)
ASSESSMENT / PLAN:   74 year old male with PMH significant for CAD /history of stent in 2018, history of kidney stones, colon polyps  # macrocytic anemia.  --Hgb stable at 11.4.  --Baseline hgb appears to be 13-14 based on 2019 labs.  However, sometime in January he was started on iron when labs prior to knee surgery revealed a hgb of 11.6 with MCV of 106.  Took oral iron for a month, follow up hgb on 12/11/18 showed no change in hgb ( 11.4).  Iron studies drawn same day don't support IDA. Ferritin was 114,  TIBC normal but percent sat slightly low at 18. However these iron studies in setting of recent oral iron supplementation --No overt GI blood loss of focal GI symptoms. He is up to date on colonoscopy.  --Anemia may be due, at least in part to Vitamin B deficiency. B12 is 104. Folate normal. He has since been starting on B12.  --I have asked him to return for CBC in about 4 weeks to make sure hemoglobin is stable.  --In approximately 6 weeks will check B12 level to see if responding appropriately to supplementation.  At that time I will also repeat his iron studies as he will have been off iron at that point for a couple of months and levels should still stable .  --If there is any evidence for iron deficiency based on next labs and/or he has any overt bleeding will proceed with work-up according --Normal TSH in Nov 2019  #history of colon polyps. --Last polyp surveillance colonoscopy 12/15/2016 by Dr. Mathews Robinsons with [facility].  Exam was complete.  Bowel prep adequate enough to identify lesions equal to or greater than 6 mm.  Findings included diverticulosis.  A 3 mm rectal polyp was removed, the remaining portion of the exam was normal.  --I requested path report while patient was in clinic today.  Random colon biopsies were done and they were within normal limits.  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 Loletha Carrow    HPI:    Chief Complaint: None.  Referred for anemia    [name] is referred by PCP for evaluation of anemia.  Review of labs in epic show hemoglobin was 14 in November 2019.  It was checked again in December, down to 10.6 for unclear reasons.  It was rechecked prior to knee surgery which she had in mid January and hemoglobin was 11.6 MCV of nearly 106.  At some point in time he was started on iron replacement perioperatively.  His knee surgery was on 10/23/2018.  Patient says he completed a month of iron, no refills were given.  His labs were checked again on 12/11/2019 and hemoglobin was stable at 11.6 with improvement in MCV to 100.7.  Studies on the same day showed normal ferritin of 114, TIBC of 305 with percent iron saturation of 18.  Vitamin B12 was low at 104, folate normal.  PCP has since started him on B12 replacement.  History of [place] says he has not seen any blood in his stool/black stools.  He has no abdominal pain, no nausea or vomiting.  He no longer takes a baby aspirin but was taking to 4 months following the surgery.  No other NSAIDs.    Past Medical History:  Diagnosis Date  . History of kidney  stones   . Myocardial infarction ([place]) 04/2017  . Statin intolerance    Used >2 different statins     Past Surgical History:  Procedure Laterality Date  . COLONOSCOPY     March of 2018   . CORONARY ANGIOPLASTY WITH STENT PLACEMENT  05/24/2017  . CORONARY STENT INTERVENTION N/A 05/24/2017   Procedure: CORONARY STENT INTERVENTION;  Surgeon: Nigel Mormon, MD;  Location: [place] [facility];  Service: Cardiovascular;  Laterality: N/A;  MID Circumflex 4.0x22 Onyx MID LAD 2.75x15 Onyx  . CYSTOSCOPY WITH URETEROSCOPY, STONE BASKETRY AND STENT PLACEMENT  ~ 1990  . HERNIA REPAIR    . INTRAVASCULAR PRESSURE WIRE/FFR STUDY N/A 05/24/2017   Procedure: INTRAVASCULAR PRESSURE WIRE/FFR STUDY;  Surgeon: Nigel Mormon, MD;   Location: [place] [facility];  Service: Cardiovascular;  Laterality: N/A;  MID LAD  . KNEE ARTHROSCOPY Right 2016  . LEFT HEART CATH AND CORONARY ANGIOGRAPHY N/A 05/24/2017   Procedure: LEFT HEART CATH AND CORONARY ANGIOGRAPHY;  Surgeon: Nigel Mormon, MD;  Location: [place] [facility];  Service: Cardiovascular;  Laterality: N/A;  . 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   Procedure: TOTAL KNEE ARTHROPLASTY;  Surgeon: Paralee Cancel, MD;  Location: [facility];  Service: Orthopedics;  Laterality: Right;  70 mins  . UMBILICAL HERNIA REPAIR  1992  . UPPER GASTROINTESTINAL ENDOSCOPY     Family History  Problem Relation Age of Onset  . Transient ischemic attack Father 38  . Bone cancer Father   . CAD Mother 43       CABG  . Diabetes Mother    Social History   Tobacco Use  . Smoking status: Former Smoker    Packs/day: 1.00    Years: 30.00    Pack years: 30.00    Types: Cigarettes    Quit date: 2003    Years since quitting: 18.2  . Smokeless tobacco: Never Used  Substance Use Topics  . Alcohol use: No  . Drug use: No   Current Outpatient Medications  Medication Sig Dispense Refill  . alfuzosin (UROXATRAL) 10 MG 24 hr tablet Take 10 mg by mouth daily with supper.    Marland Kitchen allopurinol (ZYLOPRIM) 300 MG tablet TAKE 1 TABLET (300 MG TOTAL) BY MOUTH DAILY WITH LUNCH. 90 tablet 0  . finasteride (PROSCAR) 5 MG tablet Take 5 mg by mouth daily.    . metoprolol succinate (TOPROL-XL) 25 MG 24 hr tablet Take 25 mg by mouth daily.    . nitroGLYCERIN (NITROSTAT) 0.4 MG SL tablet Place 0.4 mg under the tongue every 5 (five) minutes x 3 doses as needed for chest pain.     . ramipril (ALTACE) 5 MG capsule Take 1 capsule (5 mg total) by mouth daily. 90 capsule 1  . ranolazine (RANEXA) 500 MG 12 hr tablet TAKE 2 TABLETS BY MOUTH TWICE A DAY 360 tablet 1  . REPATHA SURECLICK [id number] MG/ML SOAJ INJECT 1ML EVERY 2 WEEKS (Patient taking differently: Inject 140 mg into the muscle every 14 (fourteen) days. ) 8  pen 14   No current facility-administered medications for this visit.   Allergies  Allergen Reactions  . Crestor [Rosuvastatin Calcium] Anaphylaxis and Swelling    Tongue swells/throat closes  . Statins Other (See Comments)    Muscle/joint pain with ALL other statin drug but Crestor is severe     Review of Systems: All systems reviewed and negative except where noted in HPI.   Creatinine clearance cannot be calculated (Patient's most recent lab  result is older than the maximum 21 days allowed.)   Physical Exam:    Wt Readings from Last 3 Encounters:  12/24/19 200 lb 3.2 oz (90.8 kg)  10/23/19 201 lb 8 oz (91.4 kg)  10/21/19 201 lb 9.6 oz (91.4 kg)    Ht 5\' 10"  (1.778 m)   Wt 200 lb 3.2 oz (90.8 kg)   BMI 28.73 kg/m  Constitutional:  Pleasant male in no acute distress. Psychiatric: Normal mood and affect. Behavior is normal. EENT: Pupils normal.  Conjunctivae are normal. No scleral icterus. Neck supple.  Cardiovascular: Normal rate, regular rhythm. No edema Pulmonary/chest: Effort normal and breath sounds normal. No wheezing, rales or rhonchi. Abdominal: Soft, nondistended, nontender. Bowel sounds active throughout. There are no masses palpable. No hepatomegaly. Neurological: Alert and oriented to person place and time. Skin: Skin is warm and dry. No rashes noted.  Tye Savoy, NP  12/24/2019, 10:30 AM  I spent 40 minutes total reviewing records, obtaining history, performing exam, counseling patient and documenting visit / findings.   Cc:  Referring Provider [name], PA

## 2019-12-24 NOTE — Patient Instructions (Signed)
If you are age 73 or older, your body mass index should be between 23-30. Your Body mass index is 28.73 kg/m. If this is out of the aforementioned range listed, please consider follow up with your Primary Care Provider.  If you are age 10 or younger, your body mass index should be between 19-25. Your Body mass index is 28.73 kg/m. If this is out of the aformentioned range listed, please consider follow up with your Primary Care Provider.   Your provider has requested that you go to the basement level for lab work in 4 and 6 weeks. Press "B" on the elevator. The lab is located at the first door on the left as you exit the elevator. 01/21/20 02/06/20   We will call you with results.  Thank you for choosing me and [facility].   Tye Savoy, NP

## 2019-12-26 NOTE — Progress Notes (Signed)
____________________________________________________________  Attending physician addendum:  Thank you for sending this case to me. I have reviewed the entire note, and the outlined plan seems appropriate.  This patient has macrocytic anemia from B12 deficiency, and does not need endoscopic work-up.  Also, based on age, last colonoscopy and pathology reports and current guidelines, this patient does not need a future surveillance colonoscopy.  Wilfrid Lund, MD  ____________________________________________________________

## 2020-01-08 ENCOUNTER — Other Ambulatory Visit (INDEPENDENT_AMBULATORY_CARE_PROVIDER_SITE_OTHER): Payer: PPO

## 2020-01-08 ENCOUNTER — Other Ambulatory Visit: Payer: Self-pay

## 2020-01-08 DIAGNOSIS — R82998 Other abnormal findings in urine: Secondary | ICD-10-CM

## 2020-01-08 LAB — URINALYSIS, ROUTINE W REFLEX MICROSCOPIC
Bilirubin Urine: NEGATIVE
Hgb urine dipstick: NEGATIVE
Ketones, ur: NEGATIVE
Leukocytes,Ua: NEGATIVE
Nitrite: NEGATIVE
RBC / HPF: NONE SEEN (ref 0–?)
Specific Gravity, Urine: 1.01 (ref 1.000–1.030)
Total Protein, Urine: NEGATIVE
Urine Glucose: 100 — AB
Urobilinogen, UA: 0.2 (ref 0.0–1.0)
WBC, UA: NONE SEEN (ref 0–?)
pH: 6.5 (ref 5.0–8.0)

## 2020-01-21 ENCOUNTER — Other Ambulatory Visit (INDEPENDENT_AMBULATORY_CARE_PROVIDER_SITE_OTHER): Payer: PPO

## 2020-01-21 DIAGNOSIS — Z86010 Personal history of colonic polyps: Secondary | ICD-10-CM | POA: Diagnosis not present

## 2020-01-21 DIAGNOSIS — D539 Nutritional anemia, unspecified: Secondary | ICD-10-CM | POA: Diagnosis not present

## 2020-01-21 LAB — IBC + FERRITIN
Ferritin: 77.8 ng/mL (ref 22.0–322.0)
Iron: 88 ug/dL (ref 42–165)
Saturation Ratios: 22.5 % (ref 20.0–50.0)
Transferrin: 279 mg/dL (ref 212.0–360.0)

## 2020-01-21 LAB — VITAMIN B12: Vitamin B-12: 471 pg/mL (ref 211–911)

## 2020-01-22 LAB — CBC
HCT: 37 % — ABNORMAL LOW (ref 39.0–52.0)
Hemoglobin: 12.4 g/dL — ABNORMAL LOW (ref 13.0–17.0)
MCHC: 33.5 g/dL (ref 30.0–36.0)
MCV: 99.8 fl (ref 78.0–100.0)
Platelets: 243 10*3/uL (ref 150.0–400.0)
RBC: 3.71 Mil/uL — ABNORMAL LOW (ref 4.22–5.81)
RDW: 13.6 % (ref 11.5–15.5)
WBC: 6.2 10*3/uL (ref 4.0–10.5)

## 2020-02-06 ENCOUNTER — Other Ambulatory Visit (INDEPENDENT_AMBULATORY_CARE_PROVIDER_SITE_OTHER): Payer: PPO

## 2020-02-06 ENCOUNTER — Other Ambulatory Visit: Payer: Self-pay

## 2020-02-06 DIAGNOSIS — D539 Nutritional anemia, unspecified: Secondary | ICD-10-CM | POA: Diagnosis not present

## 2020-02-06 LAB — CBC WITH DIFFERENTIAL/PLATELET
Basophils Absolute: 0 10*3/uL (ref 0.0–0.1)
Basophils Relative: 0.4 % (ref 0.0–3.0)
Eosinophils Absolute: 0.1 10*3/uL (ref 0.0–0.7)
Eosinophils Relative: 0.9 % (ref 0.0–5.0)
HCT: 37.8 % — ABNORMAL LOW (ref 39.0–52.0)
Hemoglobin: 12.8 g/dL — ABNORMAL LOW (ref 13.0–17.0)
Lymphocytes Relative: 19.9 % (ref 12.0–46.0)
Lymphs Abs: 1.1 10*3/uL (ref 0.7–4.0)
MCHC: 33.9 g/dL (ref 30.0–36.0)
MCV: 98.6 fl (ref 78.0–100.0)
Monocytes Absolute: 0.5 10*3/uL (ref 0.1–1.0)
Monocytes Relative: 8.4 % (ref 3.0–12.0)
Neutro Abs: 4 10*3/uL (ref 1.4–7.7)
Neutrophils Relative %: 70.4 % (ref 43.0–77.0)
Platelets: 215 10*3/uL (ref 150.0–400.0)
RBC: 3.83 Mil/uL — ABNORMAL LOW (ref 4.22–5.81)
RDW: 14 % (ref 11.5–15.5)
WBC: 5.6 10*3/uL (ref 4.0–10.5)

## 2020-02-07 LAB — IRON,TIBC AND FERRITIN PANEL
%SAT: 35 % (calc) (ref 20–48)
Ferritin: 72 ng/mL (ref 24–380)
Iron: 123 ug/dL (ref 50–180)
TIBC: 354 mcg/dL (calc) (ref 250–425)

## 2020-02-09 ENCOUNTER — Other Ambulatory Visit: Payer: Self-pay | Admitting: Physician Assistant

## 2020-03-03 ENCOUNTER — Encounter: Payer: Self-pay | Admitting: Physician Assistant

## 2020-03-03 ENCOUNTER — Ambulatory Visit (INDEPENDENT_AMBULATORY_CARE_PROVIDER_SITE_OTHER): Payer: PPO | Admitting: Physician Assistant

## 2020-03-03 ENCOUNTER — Other Ambulatory Visit: Payer: Self-pay

## 2020-03-03 VITALS — BP 116/66 | HR 60 | Temp 97.4°F | Ht 70.0 in | Wt 196.2 lb

## 2020-03-03 DIAGNOSIS — R05 Cough: Secondary | ICD-10-CM | POA: Diagnosis not present

## 2020-03-03 DIAGNOSIS — N1831 Chronic kidney disease, stage 3a: Secondary | ICD-10-CM | POA: Diagnosis not present

## 2020-03-03 DIAGNOSIS — G4700 Insomnia, unspecified: Secondary | ICD-10-CM | POA: Diagnosis not present

## 2020-03-03 DIAGNOSIS — N401 Enlarged prostate with lower urinary tract symptoms: Secondary | ICD-10-CM | POA: Diagnosis not present

## 2020-03-03 DIAGNOSIS — I10 Essential (primary) hypertension: Secondary | ICD-10-CM

## 2020-03-03 DIAGNOSIS — E119 Type 2 diabetes mellitus without complications: Secondary | ICD-10-CM

## 2020-03-03 DIAGNOSIS — R058 Other specified cough: Secondary | ICD-10-CM

## 2020-03-03 LAB — BASIC METABOLIC PANEL
BUN: 28 mg/dL — ABNORMAL HIGH (ref 6–23)
CO2: 28 mEq/L (ref 19–32)
Calcium: 9.8 mg/dL (ref 8.4–10.5)
Chloride: 101 mEq/L (ref 96–112)
Creatinine, Ser: 1.45 mg/dL (ref 0.40–1.50)
GFR: 47.7 mL/min — ABNORMAL LOW (ref >60.00)
Glucose, Bld: 228 mg/dL — ABNORMAL HIGH (ref 70–99)
Potassium: 4.5 mEq/L (ref 3.5–5.1)
Sodium: 137 mEq/L (ref 135–145)

## 2020-03-03 LAB — HEMOGLOBIN A1C: Hgb A1c MFr Bld: 6.7 % — ABNORMAL HIGH (ref 4.6–6.5)

## 2020-03-03 NOTE — Progress Notes (Signed)
Nicholas Maxwell is a 74 y.o. male is here for follow up.  I acted as a Education administrator for [employer], PA-C Anselmo Pickler, LPN   History of Present Illness:   Chief Complaint  Patient presents with  . Hypertension  . Diabetes    HPI   Hypertension Pt does check blood pressure at home averaging 110/65. Currently taking Metoprolol XL 25 mg daily and ramipril 5 mg daily. Pt denies headaches, dizziness, blurred vision, chest pain, SOB or lower leg edema. Denies excessive caffeine intake, stimulant usage, excessive alcohol intake or increase in salt consumption.  *Dry cough -- feels like he may have a dry cough over the past few months. Denies concerns for heartburn or PND. Did have COVID-19 this past winter and cough lingered since then.  Diabetes Pt checks sugars periodically at home fasting 119-151, evening 98-110. Denies hypoglycemia signs. No numbness or tingling. Currently not on medication. Due for A1c today.  Lab Results  Component Value Date   HGBA1C 6.2 (H) 10/21/2019   Insomnia He his having difficulty staying asleep. Does have to get up in the night to urinate but is able to fall asleep. Feels like he has to nap several days during the week. Denies concerns for sleep apnea but does think that he snores. Has tried melatonin with little success.  Stage 3 kidney disease Avoids regular use of anti-inflammatories. Denies unusual back pain, nausea, unintentional weight loss.   Health Maintenance Due  Topic Date Due  . COVID-19 Vaccine (1) Never done  . FOOT EXAM  11/21/2019    Past Medical History:  Diagnosis Date  . History of kidney stones   . Myocardial infarction ([place]) 04/2017  . Statin intolerance    Used >2 different statins     Social History   Tobacco Use  . Smoking status: Former Smoker    Packs/day: 1.00    Years: 30.00    Pack years: 30.00    Types: Cigarettes    Quit date: 2003    Years since quitting: 18.4  . Smokeless tobacco: Never Used    Substance Use Topics  . Alcohol use: No  . Drug use: No    Past Surgical History:  Procedure Laterality Date  . COLONOSCOPY     March of 2018   . CORONARY ANGIOPLASTY WITH STENT PLACEMENT  05/24/2017  . CORONARY STENT INTERVENTION N/A 05/24/2017   Procedure: CORONARY STENT INTERVENTION;  Surgeon: Nigel Mormon, MD;  Location: [place] [facility];  Service: Cardiovascular;  Laterality: N/A;  MID Circumflex 4.0x22 Onyx MID LAD 2.75x15 Onyx  . CYSTOSCOPY WITH URETEROSCOPY, STONE BASKETRY AND STENT PLACEMENT  ~ 1990  . HERNIA REPAIR    . INTRAVASCULAR PRESSURE WIRE/FFR STUDY N/A 05/24/2017   Procedure: INTRAVASCULAR PRESSURE WIRE/FFR STUDY;  Surgeon: Nigel Mormon, MD;  Location: [place] [facility];  Service: Cardiovascular;  Laterality: N/A;  MID LAD  . KNEE ARTHROSCOPY Right 2016  . LEFT HEART CATH AND CORONARY ANGIOGRAPHY N/A 05/24/2017   Procedure: LEFT HEART CATH AND CORONARY ANGIOGRAPHY;  Surgeon: Nigel Mormon, MD;  Location: [place] [facility];  Service: Cardiovascular;  Laterality: N/A;  . 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   Procedure: TOTAL KNEE ARTHROPLASTY;  Surgeon: Paralee Cancel, MD;  Location: [facility];  Service: Orthopedics;  Laterality: Right;  70 mins  . UMBILICAL HERNIA REPAIR  1992  . UPPER GASTROINTESTINAL ENDOSCOPY      Family History  Problem Relation Age of Onset  .  Transient ischemic attack Father 63  . Bone cancer Father   . CAD Mother 19       CABG  . Diabetes Mother     PMHx, SurgHx, SocialHx, FamHx, Medications, and Allergies were reviewed in the Visit Navigator and updated as appropriate.   Patient Active Problem List   Diagnosis Date Noted  . S/P right TKA 10/23/2019  . COVID-19 09/07/2019  . Statin intolerance 11/21/2018  . 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 History of kidney stones 02/26/2018  . Osteoarthritis of right knee 02/26/2018  . Post PTCA 05/24/2017  . Myocardial infarction ([place])  04/2017  . Polyp of colon, adenomatous 12/08/2016  . Arthritis of carpometacarpal [facility]) joint of left thumb 11/26/2016  . Essential hypertension 05/12/2015  . 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 Hypertrophic and atrophic condition of skin 05/12/2015  . Hyperlipidemia 05/12/2015  . Other seborrheic keratosis 05/12/2015  . Type 2 diabetes mellitus without complication, without long-term current use of insulin ([place]) 05/12/2015  . Hypertrophy of prostate without urinary obstruction and other lower urinary tract symptoms (LUTS) 04/08/2013    Social History   Tobacco Use  . Smoking status: Former Smoker    Packs/day: 1.00    Years: 30.00    Pack years: 30.00    Types: Cigarettes    Quit date: 2003    Years since quitting: 18.4  . Smokeless tobacco: Never Used  Substance Use Topics  . Alcohol use: No  . Drug use: No    Current Medications and Allergies:    Current Outpatient Medications:  .  alfuzosin (UROXATRAL) 10 MG 24 hr tablet, Take 10 mg by mouth daily with supper., Disp: , Rfl:  .  allopurinol (ZYLOPRIM) 300 MG tablet, TAKE 1 TABLET (300 MG TOTAL) BY MOUTH DAILY WITH LUNCH., Disp: 90 tablet, Rfl: 0 .  amoxicillin (AMOXIL) 500 MG capsule, TAKE 4 CAPSULES BY MOUTH 1 HOUR PRIOR TO DENTAL WORK, Disp: , Rfl:  .  ASPIRIN 81 81 MG chewable tablet, CHEW 1 TABLET(S) TWICE A DAY BY ORAL ROUTE FOR 4 WEEKS FOR 30 DAYS., Disp: , Rfl:  .  finasteride (PROSCAR) 5 MG tablet, Take 5 mg by mouth daily., Disp: , Rfl:  .  metoprolol succinate (TOPROL-XL) 25 MG 24 hr tablet, Take 25 mg by mouth daily., Disp: , Rfl:  .  nitroGLYCERIN (NITROSTAT) 0.4 MG SL tablet, Place 0.4 mg under the tongue every 5 (five) minutes x 3 doses as needed for chest pain. , Disp: , Rfl:  .  ramipril (ALTACE) 5 MG capsule, Take 1 capsule (5 mg total) by mouth daily., Disp: 90 capsule, Rfl: 1 .  ranolazine (RANEXA) 500 MG 12 hr tablet, TAKE 2 TABLETS BY MOUTH TWICE A DAY, Disp: 360 tablet, Rfl: 1 .  REPATHA SURECLICK [id number]  MG/ML SOAJ, INJECT 1ML EVERY 2 WEEKS (Patient taking differently: Inject 140 mg into the muscle every 14 (fourteen) days. ), Disp: 8 pen, Rfl: 14   Allergies  Allergen Reactions  . Crestor [Rosuvastatin Calcium] Anaphylaxis and Swelling    Tongue swells/throat closes  . Statins Other (See Comments)    Muscle/joint pain with ALL other statin drug but Crestor is severe    Review of Systems   ROS  Negative unless otherwise specified per HPI.  Vitals:   Vitals:   03/03/20 0831  BP: 116/66  Pulse: 60  Temp: (!) 97.4 F (36.3 C)  TempSrc: Temporal  SpO2: 96%  Weight: 196 lb 4 oz (89 kg)  Height: 5\' 10"  (1.778 m)     Body mass index is 28.16 kg/m.   Physical Exam:    Physical Exam Vitals and nursing note reviewed.  Constitutional:      General: He is not in acute distress.    Appearance: He is well-developed. He is not ill-appearing or toxic-appearing.  Cardiovascular:     Rate and Rhythm: Normal rate and regular rhythm.     Pulses: Normal pulses.     Heart sounds: Normal heart sounds, S1 normal and S2 normal.     Comments: No LE edema Pulmonary:     Effort: Pulmonary effort is normal.     Breath sounds: Normal breath sounds.  Skin:    General: Skin is warm and dry.  Neurological:     Mental Status: He is alert.     GCS: GCS eye subscore is 4. GCS verbal subscore is 5. GCS motor subscore is 6.  Psychiatric:        Speech: Speech normal.        Behavior: Behavior normal. Behavior is cooperative.      Assessment and Plan:    Jaetyn was seen today for hypertension and diabetes.  Diagnoses and all orders for this visit:  Stage 3a chronic kidney disease Update labs today, adjust medications if needed. Will refer to nephrology if worsening. -     Basic metabolic panel  Type 2 diabetes mellitus without complication, without long-term current use of insulin ([place]) Update HgbA1c today, will make recommendations accordingly. -     Hemoglobin A1c  Insomnia,  unspecified type Asked him to review with his wife if he has concerns with sleep apnea. Consider sleep study. Also consider 25 mg trazodone.  Essential hypertension; Dry cough No red flags on discussion. Controlled. Will have him hold ramipril for 2 weeks to see if this will help his dry cough. Recommended he send a MyChart message in 2 weeks to update [place] on this. Will either resume medication or switch to Losartan 25 mg based on results. Consider chest xray if persists.  . Reviewed expectations re: course of current medical issues. . Discussed self-management of symptoms. . Outlined signs and symptoms indicating need for more acute intervention. . Patient verbalized understanding and all questions were answered. . See orders for this visit as documented in the electronic medical record. . Patient received an After Visit Summary.  CMA or LPN served as scribe during this visit. History, Physical, and Plan performed by medical provider. The above documentation has been reviewed and is accurate and complete.  [name], PA-C [place], [place] 03/03/2020  Follow-up: No follow-ups on file.

## 2020-03-03 NOTE — Patient Instructions (Addendum)
It was great to see you!  I will be in touch via MyChart with your lab results.  *Hold the ramipril for 2 weeks and let me know if this has an impact on the dry cough. Send me a Pharmacist, community message. If this medication is contributing to your dry cough, we will likely replace it with Losartan 25 mg daily.  Talk to your wife about your sleep and see if she has concerns for sleep apnea. We could consider as needed Trazodone 25 mg for your sleep.  Consider the covid-19 vaccine!!  Take care,  [name] PA-C

## 2020-03-10 DIAGNOSIS — R351 Nocturia: Secondary | ICD-10-CM | POA: Diagnosis not present

## 2020-03-10 DIAGNOSIS — N403 Nodular prostate with lower urinary tract symptoms: Secondary | ICD-10-CM | POA: Diagnosis not present

## 2020-03-10 DIAGNOSIS — R3912 Poor urinary stream: Secondary | ICD-10-CM | POA: Diagnosis not present

## 2020-03-18 ENCOUNTER — Encounter: Payer: Self-pay | Admitting: Physician Assistant

## 2020-04-14 ENCOUNTER — Other Ambulatory Visit: Payer: Self-pay | Admitting: Cardiology

## 2020-04-18 ENCOUNTER — Encounter: Payer: Self-pay | Admitting: Physician Assistant

## 2020-04-20 ENCOUNTER — Encounter: Payer: Self-pay | Admitting: Physician Assistant

## 2020-04-20 ENCOUNTER — Ambulatory Visit (INDEPENDENT_AMBULATORY_CARE_PROVIDER_SITE_OTHER): Payer: PPO | Admitting: Physician Assistant

## 2020-04-20 ENCOUNTER — Other Ambulatory Visit: Payer: Self-pay | Admitting: Physician Assistant

## 2020-04-20 ENCOUNTER — Other Ambulatory Visit: Payer: Self-pay

## 2020-04-20 VITALS — BP 110/60 | HR 68 | Temp 97.8°F | Ht 70.0 in | Wt 196.4 lb

## 2020-04-20 DIAGNOSIS — N63 Unspecified lump in unspecified breast: Secondary | ICD-10-CM

## 2020-04-20 NOTE — Patient Instructions (Signed)
It was great to see you!  Please obtain ultrasound for further evaluation.     Take care,  [name] PA-C

## 2020-04-20 NOTE — Progress Notes (Signed)
Nicholas Maxwell is a 74 y.o. male here for a new problem.  I acted as a Education administrator for [employer], PA-C Anselmo Pickler, LPN   History of Present Illness:   Chief Complaint  Patient presents with  . Breast Mass    HPI   Lump Pt c/o lump on left breast and tenderness. He says the area has been sensitive for several months. On Sunday night he noticed a palpable lump that was tender. Denies discharge from nipple, unusual weight changes, lymphadenopathy, fatigue, family history of breast cancer.   Past Medical History:  Diagnosis Date  . History of kidney stones   . Myocardial infarction ([place]) 04/2017  . Statin intolerance    Used >2 different statins     Social History   Tobacco Use  . Smoking status: Former Smoker    Packs/day: 1.00    Years: 30.00    Pack years: 30.00    Types: Cigarettes    Quit date: 2003    Years since quitting: 18.5  . Smokeless tobacco: Never Used  Vaping Use  . Vaping Use: Never used  Substance Use Topics  . Alcohol use: No  . Drug use: No    Past Surgical History:  Procedure Laterality Date  . COLONOSCOPY     March of 2018   . CORONARY ANGIOPLASTY WITH STENT PLACEMENT  05/24/2017  . CORONARY STENT INTERVENTION N/A 05/24/2017   Procedure: CORONARY STENT INTERVENTION;  Surgeon: Nigel Mormon, MD;  Location: [place] [facility];  Service: Cardiovascular;  Laterality: N/A;  MID Circumflex 4.0x22 Onyx MID LAD 2.75x15 Onyx  . CYSTOSCOPY WITH URETEROSCOPY, STONE BASKETRY AND STENT PLACEMENT  ~ 1990  . HERNIA REPAIR    . INTRAVASCULAR PRESSURE WIRE/FFR STUDY N/A 05/24/2017   Procedure: INTRAVASCULAR PRESSURE WIRE/FFR STUDY;  Surgeon: Nigel Mormon, MD;  Location: [place] [facility];  Service: Cardiovascular;  Laterality: N/A;  MID LAD  . KNEE ARTHROSCOPY Right 2016  . LEFT HEART CATH AND CORONARY ANGIOGRAPHY N/A 05/24/2017   Procedure: LEFT HEART CATH AND CORONARY ANGIOGRAPHY;  Surgeon: Nigel Mormon, MD;  Location: [place] [facility];  Service: Cardiovascular;  Laterality: N/A;  . 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   Procedure: TOTAL KNEE ARTHROPLASTY;  Surgeon: Paralee Cancel, MD;  [facility];  Service: Orthopedics;  Laterality: Right;  70 mins  . UMBILICAL HERNIA REPAIR  1992  . UPPER GASTROINTESTINAL ENDOSCOPY      Family History  Problem Relation Age of Onset  . Transient ischemic attack Father 45  . Bone cancer Father   . CAD Mother 26       CABG  . Diabetes Mother     Allergies  Allergen Reactions  . Crestor [Rosuvastatin Calcium] Anaphylaxis and Swelling    Tongue swells/throat closes  . Statins Other (See Comments)    Muscle/joint pain with ALL other statin drug but Crestor is severe    Current Medications:   Current Outpatient Medications:  .  alfuzosin (UROXATRAL) 10 MG 24 hr tablet, Take 10 mg by mouth daily with supper., Disp: , Rfl:  .  allopurinol (ZYLOPRIM) 300 MG tablet, TAKE 1 TABLET (300 MG TOTAL) BY MOUTH DAILY WITH LUNCH., Disp: 90 tablet, Rfl: 0 .  amoxicillin (AMOXIL) 500 MG capsule, TAKE 4 CAPSULES BY MOUTH 1 HOUR PRIOR TO DENTAL WORK, Disp: , Rfl:  .  ASPIRIN 81 81 MG chewable tablet, CHEW 1 TABLET(S) TWICE A DAY BY ORAL ROUTE FOR 4 WEEKS FOR 30  DAYS., Disp: , Rfl:  .  finasteride (PROSCAR) 5 MG tablet, Take 5 mg by mouth daily., Disp: , Rfl:  .  metoprolol succinate (TOPROL-XL) 25 MG 24 hr tablet, Take 25 mg by mouth daily., Disp: , Rfl:  .  nitroGLYCERIN (NITROSTAT) 0.4 MG SL tablet, Place 0.4 mg under the tongue every 5 (five) minutes x 3 doses as needed for chest pain. , Disp: , Rfl:  .  ramipril (ALTACE) 5 MG capsule, Take 1 capsule (5 mg total) by mouth daily., Disp: 90 capsule, Rfl: 1 .  ranolazine (RANEXA) 500 MG 12 hr tablet, TAKE 2 TABLETS BY MOUTH TWICE A DAY, Disp: 360 tablet, Rfl: 1 .  REPATHA SURECLICK 259 MG/ML SOAJ, INJECT 1ML EVERY 2 WEEKS (Patient taking differently: Inject 140 mg into the muscle every 14 (fourteen) days. ), Disp: 8 pen, Rfl:  14   Review of Systems:   ROS  Negative unless otherwise specified per HPI.  Vitals:   Vitals:   04/20/20 1259  BP: 110/60  Pulse: 68  Temp: 97.8 F (36.6 C)  TempSrc: Temporal  SpO2: 94%  Weight: 196 lb 6.1 oz (89.1 kg)  Height: 5\' 10"  (1.778 m)     Body mass index is 28.18 kg/m.  Physical Exam:   Physical Exam Vitals and nursing note reviewed.  Constitutional:      General: He is not in acute distress.    Appearance: He is well-developed. He is not ill-appearing or toxic-appearing.  Cardiovascular:     Rate and Rhythm: Normal rate and regular rhythm.     Pulses: Normal pulses.     Heart sounds: Normal heart sounds, S1 normal and S2 normal.     Comments: No LE edema Pulmonary:     Effort: Pulmonary effort is normal.     Breath sounds: Normal breath sounds.  Chest:     Breasts:        Right: No inverted nipple.        Left: No inverted nipple.    Skin:    General: Skin is warm and dry.  Neurological:     Mental Status: He is alert.     GCS: GCS eye subscore is 4. GCS verbal subscore is 5. GCS motor subscore is 6.  Psychiatric:        Speech: Speech normal.        Behavior: Behavior normal. Behavior is cooperative.       Assessment and Plan:   Evie was seen today for breast mass.  Diagnoses and all orders for this visit:  Breast lump Stat referral for breast ultrasound for further evaluation and management. -     US BREAST LTD UNI LEFT INC AXILLA; Future -     US BREAST LTD UNI RIGHT INC AXILLA; Future  . Reviewed expectations re: course of current medical issues. . Discussed self-management of symptoms. . Outlined signs and symptoms indicating need for more acute intervention. . Patient verbalized understanding and all questions were answered. . See orders for this visit as documented in the electronic medical record. . Patient received an After-Visit Summary.  CMA or LPN served as scribe during this visit. History, Physical, and Plan  performed by medical provider. The above documentation has been reviewed and is accurate and complete.  [name], PA-C

## 2020-04-21 ENCOUNTER — Ambulatory Visit
Admission: RE | Admit: 2020-04-21 | Discharge: 2020-04-21 | Disposition: A | Discharge status: Home / Self Care | Payer: PPO | Source: Ambulatory Visit | Attending: Physician Assistant | Admitting: Physician Assistant

## 2020-04-21 ENCOUNTER — Ambulatory Visit: Payer: PPO

## 2020-04-21 DIAGNOSIS — R928 Other abnormal and inconclusive findings on diagnostic imaging of breast: Secondary | ICD-10-CM | POA: Diagnosis not present

## 2020-04-21 DIAGNOSIS — N63 Unspecified lump in unspecified breast: Secondary | ICD-10-CM

## 2020-05-02 DIAGNOSIS — C61 Malignant neoplasm of prostate: Secondary | ICD-10-CM

## 2020-05-02 HISTORY — DX: Malignant neoplasm of prostate: C61

## 2020-05-05 DIAGNOSIS — R351 Nocturia: Secondary | ICD-10-CM | POA: Diagnosis not present

## 2020-05-05 DIAGNOSIS — N403 Nodular prostate with lower urinary tract symptoms: Secondary | ICD-10-CM | POA: Diagnosis not present

## 2020-05-05 DIAGNOSIS — C61 Malignant neoplasm of prostate: Secondary | ICD-10-CM | POA: Diagnosis not present

## 2020-05-07 ENCOUNTER — Other Ambulatory Visit: Payer: Self-pay | Admitting: Physician Assistant

## 2020-05-25 ENCOUNTER — Other Ambulatory Visit: Payer: Self-pay | Admitting: Physician Assistant

## 2020-05-26 ENCOUNTER — Ambulatory Visit: Payer: PPO | Admitting: Cardiology

## 2020-05-27 ENCOUNTER — Encounter: Payer: Self-pay | Admitting: Physician Assistant

## 2020-05-27 ENCOUNTER — Telehealth: Payer: Self-pay | Admitting: Physician Assistant

## 2020-05-27 NOTE — Telephone Encounter (Signed)
Patient is requesting A1C check,. Tried to schedule lab appt but did not have orders. Patient states he will sent my chart message regarding A1C check  Thank you

## 2020-05-27 NOTE — Telephone Encounter (Signed)
Spoke to pt told him Nicholas Maxwell wanted to see you back in 3 months for an office visit, sorry for the confusion. We will do labs after visit. Pt verbalized understanding. Appt scheduled for Sept 7th. Pt verbalized understanding. Also in regards to COVID vaccine I would recommend Cos Cob it has just been officially approved by the FDA, you can contact your pharmacy and get it there. Pt verbalized understanding.

## 2020-06-03 DIAGNOSIS — C61 Malignant neoplasm of prostate: Secondary | ICD-10-CM | POA: Diagnosis not present

## 2020-06-03 DIAGNOSIS — N403 Nodular prostate with lower urinary tract symptoms: Secondary | ICD-10-CM | POA: Diagnosis not present

## 2020-06-03 DIAGNOSIS — R3912 Poor urinary stream: Secondary | ICD-10-CM | POA: Diagnosis not present

## 2020-06-03 DIAGNOSIS — N5201 Erectile dysfunction due to arterial insufficiency: Secondary | ICD-10-CM | POA: Diagnosis not present

## 2020-06-04 ENCOUNTER — Ambulatory Visit: Payer: PPO | Admitting: Cardiology

## 2020-06-04 ENCOUNTER — Other Ambulatory Visit: Payer: Self-pay | Admitting: Cardiology

## 2020-06-04 DIAGNOSIS — I2510 Atherosclerotic heart disease of native coronary artery without angina pectoris: Secondary | ICD-10-CM

## 2020-06-08 ENCOUNTER — Encounter: Payer: Self-pay | Admitting: Physician Assistant

## 2020-06-08 ENCOUNTER — Ambulatory Visit (INDEPENDENT_AMBULATORY_CARE_PROVIDER_SITE_OTHER): Payer: PPO | Admitting: Physician Assistant

## 2020-06-08 ENCOUNTER — Other Ambulatory Visit: Payer: Self-pay

## 2020-06-08 VITALS — BP 130/70 | HR 57 | Temp 97.6°F | Ht 70.0 in | Wt 197.0 lb

## 2020-06-08 DIAGNOSIS — Z23 Encounter for immunization: Secondary | ICD-10-CM

## 2020-06-08 DIAGNOSIS — E119 Type 2 diabetes mellitus without complications: Secondary | ICD-10-CM

## 2020-06-08 DIAGNOSIS — Z20822 Contact with and (suspected) exposure to covid-19: Secondary | ICD-10-CM

## 2020-06-08 DIAGNOSIS — Z01812 Encounter for preprocedural laboratory examination: Secondary | ICD-10-CM

## 2020-06-08 DIAGNOSIS — I10 Essential (primary) hypertension: Secondary | ICD-10-CM | POA: Diagnosis not present

## 2020-06-08 DIAGNOSIS — N1831 Chronic kidney disease, stage 3a: Secondary | ICD-10-CM | POA: Diagnosis not present

## 2020-06-08 LAB — POCT GLYCOSYLATED HEMOGLOBIN (HGB A1C): Hemoglobin A1C: 5.6 % (ref 4.0–5.6)

## 2020-06-08 NOTE — Progress Notes (Signed)
Nicholas Maxwell is a 74 y.o. male is here for follow up.  I acted as a Education administrator for [employer], PA-C Anselmo Pickler, LPN   History of Present Illness:   Chief Complaint  Patient presents with  . Diabetes  . Hypertension   Overall doing well since he last saw me, except recently diagnosed with prostate cancer.. he is consulting with surgeon and oncologist later this month.  HPI   Diabetes Pt following up, currently not on medication. Last A1c was 6.7. Pt is checking sugars at home, morning averaging 135-151, evenings 85-105. Today was 146. Denies hypoglycemia symptoms.  Wt Readings from Last 3 Encounters:  06/08/20 197 lb (89.4 kg)  04/20/20 196 lb 6.1 oz (89.1 kg)  03/03/20 196 lb 4 oz (89 kg)    Hypertension Pt following up, currently taking Metoprolol 25 mg daily and Ramipril 5 mg daily. Pt checks blood pressure at home, averaging systolic 751-700, diastolic 17-49. Pt denies headaches, dizziness, blurred vision, chest pain, SOB or lower leg edema. Denies excessive caffeine intake, stimulant usage, excessive alcohol intake or increase in salt consumption.  BP Readings from Last 3 Encounters:  06/08/20 130/70  04/20/20 110/60  03/03/20 116/66    COVID vaccination He is hoping to get vaccinated soon. He actually had COVID earlier this year. Overall doing well.   Health Maintenance Due  Topic Date Due  . OPHTHALMOLOGY EXAM  Never done  . COVID-19 Vaccine (1) Never done  . FOOT EXAM  11/21/2019    Past Medical History:  Diagnosis Date  . History of kidney stones   . Myocardial infarction ([place]) 04/2017  . Statin intolerance    Used >2 different statins     Social History   Tobacco Use  . Smoking status: Former Smoker    Packs/day: 1.00    Years: 30.00    Pack years: 30.00    Types: Cigarettes    Quit date: 2003    Years since quitting: 18.6  . Smokeless tobacco: Never Used  Vaping Use  . Vaping Use: Never used  Substance Use Topics  . Alcohol  use: No  . Drug use: No    Past Surgical History:  Procedure Laterality Date  . COLONOSCOPY     March of 2018   . CORONARY ANGIOPLASTY WITH STENT PLACEMENT  05/24/2017  . CORONARY STENT INTERVENTION N/A 05/24/2017   Procedure: CORONARY STENT INTERVENTION;  Surgeon: Nigel Mormon, MD;  Location: [facility];  Service: Cardiovascular;  Laterality: N/A;  MID Circumflex 4.0x22 Onyx MID LAD 2.75x15 Onyx  . CYSTOSCOPY WITH URETEROSCOPY, STONE BASKETRY AND STENT PLACEMENT  ~ 1990  . HERNIA REPAIR    . INTRAVASCULAR PRESSURE WIRE/FFR STUDY N/A 05/24/2017   Procedure: INTRAVASCULAR PRESSURE WIRE/FFR STUDY;  Surgeon: Nigel Mormon, MD;  Location: [facility];  Service: Cardiovascular;  Laterality: N/A;  MID LAD  . KNEE ARTHROSCOPY Right 2016  . LEFT HEART CATH AND CORONARY ANGIOGRAPHY N/A 05/24/2017   Procedure: LEFT HEART CATH AND CORONARY ANGIOGRAPHY;  Surgeon: Nigel Mormon, MD;  Location: [facility];  Service: Cardiovascular;  Laterality: N/A;  . 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   Procedure: TOTAL KNEE ARTHROPLASTY;  Surgeon: Paralee Cancel, MD;  Location: [facility];  Service: Orthopedics;  Laterality: Right;  70 mins  . UMBILICAL HERNIA REPAIR  1992  . UPPER GASTROINTESTINAL ENDOSCOPY      Family History  Problem Relation Age of Onset  . Transient ischemic attack  Father 34  . Bone cancer Father   . CAD Mother 31       CABG  . Diabetes Mother     PMHx, SurgHx, SocialHx, FamHx, Medications, and Allergies were reviewed in the Visit Navigator and updated as appropriate.   Patient Active Problem List   Diagnosis Date Noted  . S/P right TKA 10/23/2019  . COVID-19 09/07/2019  . Statin intolerance 11/21/2018  . 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 History of kidney stones 02/26/2018  . Osteoarthritis of right knee 02/26/2018  . Post PTCA 05/24/2017  . Myocardial infarction ([place]) 04/2017  . Polyp of colon, adenomatous  12/08/2016  . Arthritis of carpometacarpal [facility]) joint of left thumb 11/26/2016  . Essential hypertension 05/12/2015  . 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 Hypertrophic and atrophic condition of skin 05/12/2015  . Hyperlipidemia 05/12/2015  . Other seborrheic keratosis 05/12/2015  . Type 2 diabetes mellitus without complication, without long-term current use of insulin ([place]) 05/12/2015  . Hypertrophy of prostate without urinary obstruction and other lower urinary tract symptoms (LUTS) 04/08/2013    Social History   Tobacco Use  . Smoking status: Former Smoker    Packs/day: 1.00    Years: 30.00    Pack years: 30.00    Types: Cigarettes    Quit date: 2003    Years since quitting: 18.6  . Smokeless tobacco: Never Used  Vaping Use  . Vaping Use: Never used  Substance Use Topics  . Alcohol use: No  . Drug use: No    Current Medications and Allergies:    Current Outpatient Medications:  .  alfuzosin (UROXATRAL) 10 MG 24 hr tablet, Take 10 mg by mouth daily with supper., Disp: , Rfl:  .  allopurinol (ZYLOPRIM) 300 MG tablet, TAKE 1 TABLET (300 MG TOTAL) BY MOUTH DAILY WITH LUNCH., Disp: 90 tablet, Rfl: 0 .  amoxicillin (AMOXIL) 500 MG capsule, TAKE 4 CAPSULES BY MOUTH 1 HOUR PRIOR TO DENTAL WORK, Disp: , Rfl:  .  ASPIRIN 81 81 MG chewable tablet, CHEW 1 TABLET(S) TWICE A DAY BY ORAL ROUTE FOR 4 WEEKS FOR 30 DAYS., Disp: , Rfl:  .  finasteride (PROSCAR) 5 MG tablet, Take 5 mg by mouth daily., Disp: , Rfl:  .  metoprolol succinate (TOPROL-XL) 25 MG 24 hr tablet, TAKE 1 TABLET EVERY DAY, Disp: 90 tablet, Rfl: 0 .  nitroGLYCERIN (NITROSTAT) 0.4 MG SL tablet, Place 0.4 mg under the tongue every 5 (five) minutes x 3 doses as needed for chest pain. , Disp: , Rfl:  .  ramipril (ALTACE) 5 MG capsule, TAKE 1 CAPSULE BY MOUTH EVERY DAY, Disp: 90 capsule, Rfl: 1 .  ranolazine (RANEXA) 500 MG 12 hr tablet, TAKE 2 TABLETS BY MOUTH TWICE A DAY, Disp: 360 tablet, Rfl: 1 .  REPATHA SURECLICK 767 MG/ML  SOAJ, INJECT 1ML EVERY 2 WEEKS (Patient taking differently: Inject 140 mg into the muscle every 14 (fourteen) days. ), Disp: 8 pen, Rfl: 14   Allergies  Allergen Reactions  . Crestor [Rosuvastatin Calcium] Anaphylaxis and Swelling    Tongue swells/throat closes  . Statins Other (See Comments)    Muscle/joint pain with ALL other statin drug but Crestor is severe    Review of Systems   ROS Negative unless otherwise specified per HPI.  Vitals:   Vitals:   06/08/20 0907  BP: 130/70  Pulse: (!) 57  Temp: 97.6 F (36.4 C)  TempSrc: Temporal  SpO2: 97%  Weight: 197 lb (89.4 kg)  Height: 5\' 10"  (1.778 m)     Body mass index is 28.27 kg/m.   Physical Exam:    Physical Exam Vitals and nursing note reviewed.  Constitutional:      General: He is not in acute distress.    Appearance: He is well-developed. He is not ill-appearing or toxic-appearing.  Cardiovascular:     Rate and Rhythm: Normal rate and regular rhythm.     Pulses: Normal pulses.     Heart sounds: Normal heart sounds, S1 normal and S2 normal.     Comments: No LE edema Pulmonary:     Effort: Pulmonary effort is normal.     Breath sounds: Normal breath sounds.  Skin:    General: Skin is warm and dry.  Neurological:     Mental Status: He is alert.     GCS: GCS eye subscore is 4. GCS verbal subscore is 5. GCS motor subscore is 6.  Psychiatric:        Speech: Speech normal.        Behavior: Behavior normal. Behavior is cooperative.    Results for orders placed or performed in visit on 06/08/20  POCT glycosylated hemoglobin (Hb A1C)  Result Value Ref Range   Hemoglobin A1C 5.6 4.0 - 5.6 %    Assessment and Plan:    Welby was seen today for diabetes and hypertension.  Diagnoses and all orders for this visit:  Type 2 diabetes mellitus without complication, without long-term current use of insulin ([place]) Currently well controlled. Continue current regimen, follow-up in 3 months. -     POCT  glycosylated hemoglobin (Hb A1C)  Encounter for screening laboratory testing for COVID-19 virus in asymptomatic patient Discussed with patient that regardless of results of this, I recommend vaccination. He is in agreement. -     SAR CoV2 Serology (COVID 19)AB(IGG)IA; Future -     SAR CoV2 Serology (COVID 19)AB(IGG)IA  Need for immunization against influenza -     Flu Vaccine QUAD High Dose(Fluad)  Essential hypertension Currently well controlled. Further mgmt per cards. -     Basic metabolic panel; Future -     Basic metabolic panel    . Reviewed expectations re: course of current medical issues. . Discussed self-management of symptoms. . Outlined signs and symptoms indicating need for more acute intervention. . Patient verbalized understanding and all questions were answered. . See orders for this visit as documented in the electronic medical record. . Patient received an After Visit Summary.  CMA or LPN served as scribe during this visit. History, Physical, and Plan performed by medical provider. The above documentation has been reviewed and is accurate and complete.  [name], PA-C [place], [place] 06/08/2020  Follow-up: No follow-ups on file.

## 2020-06-08 NOTE — Patient Instructions (Signed)
It was great to see you!  You are doing really well with your blood sugars -- keep up the good work.  Please get the Pfizer vaccine two weeks after today.  I will be in touch via MyChart with your lab results.  We will be thinking about you during these next few steps.  Let's follow-up in 3 months, sooner if concerns.  Take care,  [name] PA-C

## 2020-06-09 ENCOUNTER — Encounter: Payer: Self-pay | Admitting: Physician Assistant

## 2020-06-09 LAB — BASIC METABOLIC PANEL
BUN/Creatinine Ratio: 19 (calc) (ref 6–22)
BUN: 32 mg/dL — ABNORMAL HIGH (ref 7–25)
CO2: 28 mmol/L (ref 20–32)
Calcium: 10 mg/dL (ref 8.6–10.3)
Chloride: 101 mmol/L (ref 98–110)
Creat: 1.65 mg/dL — ABNORMAL HIGH (ref 0.70–1.18)
Glucose, Bld: 267 mg/dL — ABNORMAL HIGH (ref 65–99)
Potassium: 5 mmol/L (ref 3.5–5.3)
Sodium: 137 mmol/L (ref 135–146)

## 2020-06-09 LAB — SARS-COV-2 ANTIBODY(IGG)SPIKE,SEMI-QUANTITATIVE: SARS COV1 AB(IGG)SPIKE,SEMI QN: 10.2 index — ABNORMAL HIGH (ref <1.00)

## 2020-06-10 ENCOUNTER — Other Ambulatory Visit: Payer: Self-pay | Admitting: *Deleted

## 2020-06-10 DIAGNOSIS — N1832 Chronic kidney disease, stage 3b: Secondary | ICD-10-CM

## 2020-06-10 DIAGNOSIS — N1831 Chronic kidney disease, stage 3a: Secondary | ICD-10-CM

## 2020-06-10 NOTE — Telephone Encounter (Signed)
Okay to send referral. 

## 2020-06-11 ENCOUNTER — Encounter: Payer: Self-pay | Admitting: Physician Assistant

## 2020-06-11 ENCOUNTER — Other Ambulatory Visit: Payer: Self-pay | Admitting: Urology

## 2020-06-11 ENCOUNTER — Other Ambulatory Visit (HOSPITAL_COMMUNITY): Payer: Self-pay | Admitting: Urology

## 2020-06-11 DIAGNOSIS — C61 Malignant neoplasm of prostate: Secondary | ICD-10-CM | POA: Insufficient documentation

## 2020-06-17 ENCOUNTER — Encounter: Payer: Self-pay | Admitting: Medical Oncology

## 2020-06-17 NOTE — Progress Notes (Signed)
I called pt to introduce myself as the Prostate Nurse Navigator and the Coordinator of the Prostate [facility].  1. I confirmed with the patient he is aware of his referral to the clinic 9/28, arriving @ 12:30 pm.   2. I discussed the format of the clinic and the physicians he will be seeing that day.  3. I discussed where the clinic is located and how to contact me.  4. I confirmed his address and informed him I would be mailing a packet of information and forms to be completed. I asked him to bring them with him the day of his appointment.   He voiced understanding of the above. I asked him to call me if he has any questions or concerns regarding his appointments or the forms he needs to complete.

## 2020-06-18 ENCOUNTER — Encounter (HOSPITAL_COMMUNITY): Payer: Self-pay

## 2020-06-18 ENCOUNTER — Ambulatory Visit (HOSPITAL_COMMUNITY)
Admission: RE | Admit: 2020-06-18 | Discharge: 2020-06-18 | Disposition: A | Discharge status: Home / Self Care | Payer: PPO | Source: Ambulatory Visit | Attending: Urology | Admitting: Urology

## 2020-06-18 ENCOUNTER — Other Ambulatory Visit: Payer: Self-pay

## 2020-06-18 ENCOUNTER — Ambulatory Visit (HOSPITAL_COMMUNITY): Admission: RE | Admit: 2020-06-18 | Payer: PPO | Source: Ambulatory Visit

## 2020-06-18 ENCOUNTER — Encounter (HOSPITAL_COMMUNITY)
Admission: RE | Admit: 2020-06-18 | Discharge: 2020-06-18 | Disposition: A | Discharge status: Home / Self Care | Payer: PPO | Source: Ambulatory Visit | Attending: Urology | Admitting: Urology

## 2020-06-18 DIAGNOSIS — Z96651 Presence of right artificial knee joint: Secondary | ICD-10-CM | POA: Diagnosis not present

## 2020-06-18 DIAGNOSIS — N200 Calculus of kidney: Secondary | ICD-10-CM | POA: Diagnosis not present

## 2020-06-18 DIAGNOSIS — C61 Malignant neoplasm of prostate: Secondary | ICD-10-CM

## 2020-06-18 DIAGNOSIS — N281 Cyst of kidney, acquired: Secondary | ICD-10-CM | POA: Diagnosis not present

## 2020-06-18 DIAGNOSIS — Z8546 Personal history of malignant neoplasm of prostate: Secondary | ICD-10-CM | POA: Diagnosis not present

## 2020-06-18 DIAGNOSIS — M19012 Primary osteoarthritis, left shoulder: Secondary | ICD-10-CM | POA: Diagnosis not present

## 2020-06-18 DIAGNOSIS — K5730 Diverticulosis of large intestine without perforation or abscess without bleeding: Secondary | ICD-10-CM | POA: Diagnosis not present

## 2020-06-18 DIAGNOSIS — M19032 Primary osteoarthritis, left wrist: Secondary | ICD-10-CM | POA: Diagnosis not present

## 2020-06-18 MED ORDER — TECHNETIUM TC 99M MEDRONATE IV KIT
20.0000 | PACK | Freq: Once | INTRAVENOUS | Status: AC | PRN
  Administered 2020-06-18: 20 via INTRAVENOUS

## 2020-06-24 ENCOUNTER — Encounter: Payer: Self-pay | Admitting: Medical Oncology

## 2020-06-24 ENCOUNTER — Other Ambulatory Visit: Payer: Self-pay

## 2020-06-24 ENCOUNTER — Ambulatory Visit: Payer: PPO | Admitting: Cardiology

## 2020-06-24 ENCOUNTER — Encounter: Payer: Self-pay | Admitting: Cardiology

## 2020-06-24 VITALS — BP 108/56 | HR 59 | Resp 15 | Ht 70.0 in | Wt 199.0 lb

## 2020-06-24 DIAGNOSIS — E782 Mixed hyperlipidemia: Secondary | ICD-10-CM | POA: Diagnosis not present

## 2020-06-24 DIAGNOSIS — I10 Essential (primary) hypertension: Secondary | ICD-10-CM | POA: Diagnosis not present

## 2020-06-24 DIAGNOSIS — I2510 Atherosclerotic heart disease of native coronary artery without angina pectoris: Secondary | ICD-10-CM

## 2020-06-24 MED ORDER — NITROGLYCERIN 0.4 MG SL SUBL: 0.4000 mg | SUBLINGUAL_TABLET | SUBLINGUAL | 2 refills | Status: DC | PRN

## 2020-06-24 NOTE — Progress Notes (Signed)
Patient is here for follow up visit.  Subjective:   Nicholas Maxwell, male    DOB: 10-Apr-1946, 74 y.o.   MRN: [id number]  Chief Complaint  Patient presents with  . Follow-up    1 year  . Coronary Artery Disease   74 year old pleasant Caucasian male with CAD s/p two vessel succefful PCI to mLCx and mLAD, controlled hypertension, type 2 DM, CKD 3, hyperlipidemia with statin intolerance, on PCSK9 inhibitor.  Patient is doing well, denies chest pain, shortness of breath, palpitations, leg edema, orthopnea, PND, TIA/syncope. He was recently diagnosed with prostate cancer. He is seeing specialist re: treatment plan. Fortunately, it appears to not have metastasized. Recent BMP reviewed wit the patient, details below.    Current Outpatient Medications on File Prior to Visit  Medication Sig Dispense Refill  . alfuzosin (UROXATRAL) 10 MG 24 hr tablet Take 10 mg by mouth daily with supper.    Marland Kitchen allopurinol (ZYLOPRIM) 300 MG tablet TAKE 1 TABLET (300 MG TOTAL) BY MOUTH DAILY WITH LUNCH. 90 tablet 0  . amoxicillin (AMOXIL) 500 MG capsule TAKE 4 CAPSULES BY MOUTH 1 HOUR PRIOR TO DENTAL WORK    . ASPIRIN 81 81 MG chewable tablet CHEW 1 TABLET(S) TWICE A DAY BY ORAL ROUTE FOR 4 WEEKS FOR 30 DAYS.    . finasteride (PROSCAR) 5 MG tablet Take 5 mg by mouth daily.    . metoprolol succinate (TOPROL-XL) 25 MG 24 hr tablet TAKE 1 TABLET EVERY DAY 90 tablet 0  . nitroGLYCERIN (NITROSTAT) 0.4 MG SL tablet Place 0.4 mg under the tongue every 5 (five) minutes x 3 doses as needed for chest pain.     . ramipril (ALTACE) 5 MG capsule TAKE 1 CAPSULE BY MOUTH EVERY DAY 90 capsule 1  . ranolazine (RANEXA) 500 MG 12 hr tablet TAKE 2 TABLETS BY MOUTH TWICE A DAY 360 tablet 1  . REPATHA SURECLICK 644 MG/ML SOAJ INJECT 1ML EVERY 2 WEEKS (Patient taking differently: Inject 140 mg into the muscle every 14 (fourteen) days. ) 8 pen 14   No current facility-administered medications on file prior to visit.     Cardiovascular studies:  EKG 06/24/2020: Sinus rhythm 57 bpm Possible old anteroseptal infarct.  Abdominal aortic duplex 09/11/2018: The maximum aorta diameter is 2.53 cm (mid). Diffuse heterogeneous plaque observed in the proximal, mid and distal aorta.  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  Normal flow velocities noted in aorta and iliac arteries. Rec: Rescan in 5 years if clinically indicated.  Cath 05/24/2017: LM: Normal LAD: 60% mid LAD calcific stenosis FFR +ve 0.77. Mild diffuse disease in small caliber distal LAD  Successful complex PTCA and DES placement Onyx 2.75 X 15 mm Mid LAD  (60%-->0%, TIMI III - TIMI III) Ramus intermedius: Small caliber vessel with 50-70% stenoses LCx: Large dominant vessel with mid circumflex subtotal occlusion with retrograde filling of distal circumflex and PDA Successful PTCA and DES placement Onyx 4.0 X 22 mm Mid left circumflex  (99%-->0%, TIMI I - TIMI III) RCA: Small caliber nondominant vessel with mild diffuse disease  Exercise myoview stress 05/18/2017: 1. The patient performed treadmill exercise using a Bruce protocol, completing 8 minutes. The patient completed an estimated workload of 10.12 METS achieving 85% maximum predicted heart rate. Stress symptoms included 4/10 chest pain and dyspnea.  2. The stress electrocardiogram revealed 1-1.5 mm of horizontal ST depression in lead (s):II, III, aVF, V4, V5, V6  consistent with myocardial ischemia.  3. This is an abnormal myocardial perfusion imaging study demonstrating a mixture of scar plus ischemia in the basal inferoseptal, basal inferior, basal inferolateral, mid inferoseptal, mid inferior, mid inferolateral and apical inferior myocardial wall(s) (LCx/RCA territory) 4. Left ventricular systolic function was abnormal with regional wall motion abnormalities. The left ventricular ejection fraction was calculated to be 39%.  5. This is  a high risk study  Echocardiogram 05/15/2017: Left ventricle cavity is normal in size. Left ventricle regional wall motion findings: Basal anterolateral, Basal inferolateral, Mid anterolateral and Mid inferolateral hypokinesis. Calculated EF 55%. Structurally normal mitral valve with moderate (Grade II) regurgitation.  Recent labs: 06/08/2020: Glucose 267, BUN/Cr 32/1.65. Na/K 137/5.0.  HbA1C 5.6%  03/03/2020: Glucose 228, BUN/Cr 28/1.45. Na/K 137/4.5.   05/28/2019: Chol 132, TG 274, HDL 43, LDL 59  2019: TSH 2.0 normal    Review of Systems  Cardiovascular: Negative for chest pain, dyspnea on exertion, leg swelling, palpitations and syncope.       Objective:   Vitals:   06/24/20 1227 06/24/20 1229  BP: (!) 111/47 (!) 108/56  Pulse: (!) 58 (!) 59  Resp: 15   SpO2: 99% 98%     Physical Exam Vitals and nursing note reviewed.  Constitutional:      General: He is not in acute distress. Neck:     Vascular: No JVD.  Cardiovascular:     Rate and Rhythm: Normal rate and regular rhythm.     Heart sounds: Normal heart sounds. No murmur heard.   Pulmonary:     Effort: Pulmonary effort is normal.     Breath sounds: Normal breath sounds. No wheezing or rales.         Assessment & Recommendations:   74 year old pleasant Caucasian male with CAD, hypertension, type 2 DM, CKD 3, hyperlipidemia, statin intolerance, prostate cancer  CAD: Stable. Continue aspirin, Repatha, metoprolol, ramipril, Ranexa.  Mixed hyperlipidemia Statin intolerant. Well controlled on [place].  Will check lipid panel  Essential hypertension Controlled  Type 2 diabetes mellitus without complication, without long-term current use of insulin ([place]) Followed by PCP.  CKD: Avoid NSAIDS. Encourage hydration. F/u w/PCP  Aortic ectasia: Repeat aorta duplex in 2024  F/u in 1 year.   Nigel Mormon, MD [facility]. [facility] Pager: [phone] Office: [phone] If no answer Cell  [phone]

## 2020-06-25 ENCOUNTER — Other Ambulatory Visit: Payer: Self-pay | Admitting: Cardiology

## 2020-06-25 DIAGNOSIS — I2510 Atherosclerotic heart disease of native coronary artery without angina pectoris: Secondary | ICD-10-CM

## 2020-06-25 NOTE — Telephone Encounter (Signed)
Nicholas Maxwell, can you look into this pt has not received a call yet.

## 2020-06-26 LAB — LIPID PANEL WITH LDL/HDL RATIO
Cholesterol, Total: 141 mg/dL (ref 100–199)
HDL: 47 mg/dL (ref >39)
LDL Chol Calc (NIH): 63 mg/dL (ref 0–99)
LDL/HDL Ratio: 1.3 ratio (ref 0.0–3.6)
Triglycerides: 189 mg/dL — ABNORMAL HIGH (ref 0–149)
VLDL Cholesterol Cal: 31 mg/dL (ref 5–40)

## 2020-06-28 ENCOUNTER — Encounter: Payer: Self-pay | Admitting: Medical Oncology

## 2020-06-28 ENCOUNTER — Encounter: Payer: Self-pay | Admitting: Radiation Oncology

## 2020-06-28 NOTE — Progress Notes (Signed)
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  If Prostate Cancer, Gleason Score is (4 + 3) and PSA is (1.32) on finasteride. Prostate volume: 64 mL.   [name] with a history of transurethral microwave thermotherapy. PSA rose from 1.17 to 1.32 on finasteride.    Past/Anticipated interventions by urology, if any: thermotherapy, surveillance, finasteride, referral to [facility]  Past/Anticipated interventions by medical oncology, if any: no  Weight changes, if any: no  Bowel/Bladder complaints, if any: IPSS 14. SHIM 5. Denies any bowel complaints.    Nausea/Vomiting, if any: no   Pain issues, if any:  Reports back pain.   SAFETY ISSUES:  Prior radiation? no  Pacemaker/ICD? no  Possible current pregnancy? no, male patient  Is the patient on methotrexate? no  Current Complaints / other details:  74 year old male. Married. Retired. Stopped smoking in 2001. Stopped drinking in 2014.

## 2020-06-29 ENCOUNTER — Ambulatory Visit
Admission: RE | Admit: 2020-06-29 | Discharge: 2020-06-29 | Disposition: A | Discharge status: Home / Self Care | Payer: PPO | Source: Ambulatory Visit | Attending: Radiation Oncology | Admitting: Radiation Oncology

## 2020-06-29 ENCOUNTER — Other Ambulatory Visit: Payer: Self-pay

## 2020-06-29 ENCOUNTER — Encounter: Payer: Self-pay | Admitting: Radiation Oncology

## 2020-06-29 ENCOUNTER — Ambulatory Visit (HOSPITAL_BASED_OUTPATIENT_CLINIC_OR_DEPARTMENT_OTHER): Payer: PPO | Admitting: Oncology

## 2020-06-29 ENCOUNTER — Encounter: Payer: Self-pay | Admitting: General Practice

## 2020-06-29 ENCOUNTER — Encounter: Payer: Self-pay | Admitting: Medical Oncology

## 2020-06-29 DIAGNOSIS — Z833 Family history of diabetes mellitus: Secondary | ICD-10-CM | POA: Diagnosis not present

## 2020-06-29 DIAGNOSIS — Z79899 Other long term (current) drug therapy: Secondary | ICD-10-CM

## 2020-06-29 DIAGNOSIS — R972 Elevated prostate specific antigen [PSA]: Secondary | ICD-10-CM | POA: Diagnosis not present

## 2020-06-29 DIAGNOSIS — Z7982 Long term (current) use of aspirin: Secondary | ICD-10-CM | POA: Diagnosis not present

## 2020-06-29 DIAGNOSIS — C61 Malignant neoplasm of prostate: Secondary | ICD-10-CM

## 2020-06-29 DIAGNOSIS — Z87442 Personal history of urinary calculi: Secondary | ICD-10-CM | POA: Diagnosis not present

## 2020-06-29 DIAGNOSIS — I252 Old myocardial infarction: Secondary | ICD-10-CM | POA: Diagnosis not present

## 2020-06-29 DIAGNOSIS — Z808 Family history of malignant neoplasm of other organs or systems: Secondary | ICD-10-CM

## 2020-06-29 DIAGNOSIS — I2510 Atherosclerotic heart disease of native coronary artery without angina pectoris: Secondary | ICD-10-CM | POA: Diagnosis not present

## 2020-06-29 DIAGNOSIS — Z87891 Personal history of nicotine dependence: Secondary | ICD-10-CM | POA: Insufficient documentation

## 2020-06-29 DIAGNOSIS — Z8249 Family history of ischemic heart disease and other diseases of the circulatory system: Secondary | ICD-10-CM | POA: Diagnosis not present

## 2020-06-29 NOTE — Progress Notes (Signed)
[place] Psychosocial Distress Screening Spiritual Care  Met with Nicholas Maxwell in [facility] to introduce [place] team/resources, reviewing distress screen per protocol.  The patient scored a 0 on the Psychosocial Distress Thermometer which indicates minimal distress. Also assessed for distress and other psychosocial needs.   ON[facility]N DISTRESS SCREENING 06/29/2020  Screening Type Initial Screening  Physical Problem type Sleep/insomnia  Referral to support programs Yes    Follow up needed: No. Nicholas Maxwell is aware of ongoing [place] team/programming resource availability, should needs arise or circumstances change.   [place], [place], [facility] Pager [phone] Voicemail [phone]

## 2020-06-29 NOTE — Progress Notes (Signed)
Radiation Oncology         [phone] ________________________________  Multidisciplinary Prostate Cancer Clinic  Initial Radiation Oncology Consultation  Name: Nicholas Maxwell MRN: [id number]  Date: 06/29/2020  DOB: 1946-06-18  CC:[name], [place]  [name], MD   REFERRING PHYSICIAN: [name], MD  DIAGNOSIS: 74 y.o. 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 Prostate cancer ([place])  C61     HISTORY OF PRESENT ILLNESS::Nicholas Maxwell is a 74 y.o. gentleman with a diagnosis of prostate cancer.  He has been followed by Dr. Jeffie Pollock since at least 2017 with BPH with BOO, s/p TUMT as well as a history of kidney stone in 2019.  He also has a history of a prostate nodule that has remained stable.  He had previously been on finasteride and was restarted on this in 12/2018.  His PSA initially showed good response to the finasteride, dropping from 2.66 to 1.17.  At follow up in 03/2020, his PSA had risen to 1.32 (2.64 adjusted for finasteride) despite medication, and his prostate nodule was noted to be slightly larger (now 12 mm).  In light of these findings, the patient proceeded to transrectal ultrasound with 12 biopsies of the prostate on 05/05/2020.  The prostate volume measured 64 cc.  Out of 12 core biopsies,3 were positive.  The maximum Gleason score was 4+3, and this was seen in the right base (two small foci, with PNI) and right base lateral. 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 CT A/P noted findings correlating to patient's chronic bladder outlet obstruction.  The patient reviewed the biopsy results with his urologist and he has kindly been referred today to the multidisciplinary prostate cancer clinic for presentation of pathology  and radiology studies in our conference for discussion of potential radiation treatment options and clinical evaluation.  PREVIOUS RADIATION THERAPY: No  PAST MEDICAL HISTORY:  has a past medical history of History of kidney stones, Myocardial infarction ([place]) (04/2017), Prostate cancer ([place]) (05/2020), and Statin intolerance.    PAST SURGICAL HISTORY: Past Surgical History:  Procedure Laterality Date   COLONOSCOPY     March of 2018    CORONARY ANGIOPLASTY WITH STENT PLACEMENT  05/24/2017   CORONARY STENT INTERVENTION N/A 05/24/2017   Procedure: CORONARY STENT INTERVENTION;  Surgeon: Nigel Mormon, MD;  Location: [facility];  Service: Cardiovascular;  Laterality: N/A;  MID Circumflex 4.0x22 Onyx MID LAD 2.75x15 Onyx   CYSTOSCOPY WITH URETEROSCOPY, STONE BASKETRY AND STENT PLACEMENT  ~ [place] PRESSURE WIRE/FFR STUDY N/A 05/24/2017   Procedure: INTRAVASCULAR PRESSURE WIRE/FFR STUDY;  Surgeon: Nigel Mormon, MD;  Location: [facility];  Service: Cardiovascular;  Laterality: N/A;  MID LAD   KNEE ARTHROSCOPY Right 2016   LEFT HEART CATH AND CORONARY ANGIOGRAPHY N/A 05/24/2017   Procedure: LEFT HEART CATH AND CORONARY ANGIOGRAPHY;  Surgeon: Nigel Mormon, MD;  Location: [facility];  Service: Cardiovascular;  Laterality: N/A;   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   Procedure: TOTAL KNEE ARTHROPLASTY;  Surgeon: Paralee Cancel, MD;  Location: [facility];  Service: Orthopedics;  Laterality: Right;  70 mins   UMBILICAL HERNIA REPAIR  1992   UPPER  GASTROINTESTINAL ENDOSCOPY      FAMILY HISTORY: family history includes Bone cancer in his father; CAD (age of onset: 76) in his mother; Diabetes in his mother; Transient ischemic attack (age of onset: 53) in his father.  SOCIAL HISTORY:  reports that he quit smoking about 18 years ago. His smoking use included cigarettes. He has a 30.00 pack-year smoking history. He has  never used smokeless tobacco. He reports previous alcohol use. He reports that he does not use drugs.  ALLERGIES: Crestor [rosuvastatin calcium] and Statins  MEDICATIONS:  Current Outpatient Medications  Medication Sig Dispense Refill   alfuzosin (UROXATRAL) 10 MG 24 hr tablet Take 10 mg by mouth daily with supper.     allopurinol (ZYLOPRIM) 300 MG tablet TAKE 1 TABLET (300 MG TOTAL) BY MOUTH DAILY WITH LUNCH. 90 tablet 0   ASPIRIN 81 81 MG chewable tablet CHEW 1 TABLET(S) TWICE A DAY BY ORAL ROUTE FOR 4 WEEKS FOR 30 DAYS.     finasteride (PROSCAR) 5 MG tablet Take 5 mg by mouth daily.     metoprolol succinate (TOPROL-XL) 25 MG 24 hr tablet TAKE 1 TABLET EVERY DAY 90 tablet 0   ramipril (ALTACE) 5 MG capsule TAKE 1 CAPSULE BY MOUTH EVERY DAY 90 capsule 1   ranolazine (RANEXA) 500 MG 12 hr tablet TAKE 2 TABLETS BY MOUTH TWICE A DAY 360 tablet 1   REPATHA SURECLICK 350 MG/ML SOAJ INJECT 1ML EVERY 2 WEEKS (Patient taking differently: Inject 140 mg into the muscle every 14 (fourteen) days. ) 8 pen 14   amoxicillin (AMOXIL) 500 MG capsule TAKE 4 CAPSULES BY MOUTH 1 HOUR PRIOR TO DENTAL WORK (Patient not taking: Reported on 06/29/2020)     nitroGLYCERIN (NITROSTAT) 0.4 MG SL tablet Place 1 tablet (0.4 mg total) under the tongue every 5 (five) minutes x 3 doses as needed for chest pain. (Patient not taking: Reported on 06/29/2020) 30 tablet 2   No current facility-administered medications for this encounter.    REVIEW OF SYSTEMS:  On review of systems, the patient reports that he is doing well overall. He denies any chest pain, shortness of breath, cough, fevers, chills, night sweats, unintended weight changes. He denies any bowel disturbances, and denies abdominal pain, nausea or vomiting. He denies any new musculoskeletal or joint aches or pains. His IPSS was 14, indicating moderate urinary symptoms. His SHIM was 5, indicating he has severe erectile dysfunction. A complete review of systems  is obtained and is otherwise negative.   PHYSICAL EXAM:  Wt Readings from Last 3 Encounters:  06/24/20 199 lb (90.3 kg)  06/08/20 197 lb (89.4 kg)  04/20/20 196 lb 6.1 oz (89.1 kg)   Temp Readings from Last 3 Encounters:  06/08/20 97.6 F (36.4 C) (Temporal)  04/20/20 97.8 F (36.6 C) (Temporal)  03/03/20 (!) 97.4 F (36.3 C) (Temporal)   BP Readings from Last 3 Encounters:  06/24/20 (!) 108/56  06/08/20 130/70  04/20/20 110/60   Pulse Readings from Last 3 Encounters:  06/24/20 (!) 59  06/08/20 (!) 57  04/20/20 68   Pain Assessment Pain Score:  (back pain)/10  In general this is a well appearing Caucasian male man in no acute distress. He's alert and oriented x4 and appropriate throughout the examination. Cardiopulmonary assessment is negative for acute distress and he exhibits normal effort.   KPS = 90  100 - Normal; no complaints; no evidence of disease. 90   - Able to carry on normal activity; minor signs or symptoms  of disease. 80   - Normal activity with effort; some signs or symptoms of disease. 60   - Cares for self; unable to carry on normal activity or to do active work. 60   - Requires occasional assistance, but is able to care for most of his personal needs. 50   - Requires considerable assistance and frequent medical care. 70   - Disabled; requires special care and assistance. 34   - Severely disabled; hospital admission is indicated although death not imminent. 70   - Very sick; hospital admission necessary; active supportive treatment necessary. 10   - Moribund; fatal processes progressing rapidly. 0     - Dead  Karnofsky DA, Abelmann [place], Craver LS and Burchenal [facility] [phone]) The use of the nitrogen mustards in the palliative treatment of carcinoma: with particular reference to bronchogenic carcinoma Cancer 1 634-56   LABORATORY DATA:  Lab Results  Component Value Date   WBC 5.6 02/06/2020   HGB 12.8 (L) 02/06/2020   HCT 37.8 (L) 02/06/2020   MCV 98.6  02/06/2020   PLT 215.0 02/06/2020   Lab Results  Component Value Date   NA 137 06/08/2020   K 5.0 06/08/2020   CL 101 06/08/2020   CO2 28 06/08/2020   Lab Results  Component Value Date   ALT 16 10/08/2019   AST 15 10/08/2019   ALKPHOS 60 10/08/2019   BILITOT 0.5 10/08/2019     RADIOGRAPHY: NM Bone Scan Whole Body  Result Date: 06/18/2020 CLINICAL DATA:  Prostate cancer, PSA 1.32 on 03/03/2020 EXAM: NUCLEAR MEDICINE WHOLE BODY BONE SCAN TECHNIQUE: Whole body anterior and posterior images were obtained approximately 3 hours after intravenous injection of radiopharmaceutical. RADIOPHARMACEUTICALS:  20.0 mCi Technetium-5m MDP IV COMPARISON:  None Correlation: CT abdomen and pelvis 06/18/2020 FINDINGS: Photopenic defect at RIGHT knee from knee prosthesis. Minimal uptake of tracer adjacent to the knee prosthesis is consistent with relative recent RIGHT total knee arthroplasty in January 2021. Uptake at the shoulders and sternoclavicular joints as well as LEFT wrist, typically degenerative. No worrisome sites of abnormal osseous tracer accumulation are identified to suggest osseous metastatic disease. Expected urinary tract and soft tissue distribution of tracer. IMPRESSION: No scintigraphic evidence of osseous metastatic disease. Electronically Signed   By: Lavonia Dana M.D.   On: 06/18/2020 17:18      IMPRESSION/PLAN: 74 y.o. 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   We discussed the patient's workup and outlined the nature of prostate cancer in this setting. The patient's T stage, Gleason's score, and PSA put him into the intermediate risk group. Accordingly, he is eligible for a variety of potential treatment options including brachytherapy, 5.5 weeks of external radiation or prostatectomy. We discussed the available radiation techniques, and focused on the details and logistics of delivery. The patient  is not an ideal candidate for brachytherapy with a prostate volume of 64 cc and moderate LUTS despite maximal medical therapy with Uroxatral and finasteride. We discussed and outlined the risks, benefits, short and long-term effects associated with radiotherapy and compared and contrasted these with prostatectomy. We discussed the role of SpaceOAR in reducing the rectal toxicity associated with external beam radiotherapy. We also detailed the role of ADT in the treatment of unfavorable risk prostate cancer and outlined the associated side effects that could be expected with this therapy.  In his situation, given the focus of intraductal carcinoma which suggests more aggressive  disease, we would recommend using ADT concurrent with radiotherapy.  He was encouraged to ask questions that were answered to his stated satisfaction.  At the end of the conversation, the patient is interested in moving forward with ST-ADT concurrent with a 5-1/2-week course of daily external beam radiotherapy.  He would like to have further conversation with Dr. Jeffie Pollock prior to finalizing his decision so we will share our conversation with Dr. Jeffie Pollock and help coordinate a follow-up visit in the near future for further discussion and potentially, initiation of ADT.  He understands the rationale behind the intentional delay of starting radiotherapy approximately 2 months after starting ADT, to allow for the radiosensitizing effects of this therapy.  Once he reaches a final decision and should he elect to proceed with the prostate IMRT, we will also coordinate for placement of fiducial markers and SpaceOAR gel, prior to CT simulation, in early December, in anticipation of beginning the daily radiation treatments approximately 2 months from the start of ADT.  We enjoyed meeting him today and look forward to continuing to participate in his care.     Nicholos Johns, PA-C    Tyler Pita, MD  [facility]  Dial: [phone]   Fax: [phone] [email or web address]   Skype   [email or web address]   This document serves as a record of services personally performed by Tyler Pita, MD and Freeman Caldron, PA-C. It was created on their behalf by Wilburn Mylar, a trained medical scribe. The creation of this record is based on the scribe's personal observations and the provider's statements to them. This document has been checked and approved by the attending provider.

## 2020-06-29 NOTE — Consult Note (Signed)
[facility]     06/29/2020   --------------------------------------------------------------------------------   Nicholas Maxwell. Wulff  MRN: [id number]  DOB: 07-12-1946, 74 year old Male  SSN: [id number]   PRIMARY CARE:  [name], [place]  REFERRING:  [name], MD  PROVIDER:  [name], M.D.  TREATING:  Raynelle Bring, M.D.  LOCATION:  [facility] [phone]     --------------------------------------------------------------------------------   CC/HPI: CC: Prostate Cancer   Physician requesting consult: Dr. [name]  PCP: Dr. Kathryne Eriksson  Location of consult: [facility]   Mr. Capri is a 74 year old gentleman with gout, diabetes, hyperlipidemia, and coronary artery disease (s/p MI) who was found to have a 12 mm right base prostate nodule. His PSA was 1.32 on finasteride. He underwent a TRUS biopsy of the prostate on 05/05/20 by Dr. Jeffie Pollock. This confirmed Gleason 4+3=7 adenocarcinoma with a component of intraductal carcinoma and 3 out of 12 biopsy cores positive all near the identified nodule.   Family history: None.   Imaging studies:  CT abd/pelvis (06/18/20): No evidence of metastatic disease, bilateral non-obstructing renal calculi.  Bone scan (06/18/20): No evidence of metastatic disease.   PMH: He has a history of gout, diabetes, hyperlipidemia, and CAD (s/p MI).  PSH: TUMT, umbilical hernia repair.   TNM stage: cT2a Nx Mx  PSA: 1.32 (5 ARI)  Gleason score: 4+3=7 (GG 3)  Biopsy (05/05/20): 3/12 cores positive  Left: Benign  Right: R mid (20%, intraductal carcinoma), R base (70%, 4+3=7), R lateral base (40%, 4+3=7, PNI)  Prostate volume: 64 cc   Nomogram  OC disease: 54%  EPE: 42%  SVI: 5%  LNI: 7%  PFS (5 year, 10 year): 79%, 67%   Urinary function: IPSS is 14.  Erectile function: SHIM score is 5.     ALLERGIES: Crestor Statins     MEDICATIONS: Alfuzosin Hcl Er 10 mg  tablet, extended release 24 hr TAKE 1 TABLET EACH DAY.  Aspirin 81 mg tablet,chewable  Finasteride 5 mg tablet 1 tablet PO Daily  Metoprolol Succinate 25 mg tablet, extended release 24 hr  Allopurinol 300 mg tablet Oral  Amoxicillin 500 mg tablet Takes prior to dental visits  Levofloxacin 750 mg tablet 1 po 1 hour prior to the procedure  Nitroglycerin  Ramipril 5 mg capsule  Ranolazine Er 500 mg tablet, extended release 12 hr  Repatha Sureclick     GU PSH: Complex Uroflow - 03/05/2019, 2019, 2017 Cystoscopy - 2019, 2017 Locm 300-399Mg /Ml Iodine,1Ml - 06/18/2020, 2019 Prostate Needle Biopsy - 05/05/2020 Rpr Umbil Hern; Reduc < 5 Yr - 2017 TUMT - 2017       PSH Notes: Umbilical Hernia Repair, Surg Prostate Transureth Dest Tissue Microwave Thermotherapy   NON-GU PSH: Knee replacement, Right Surgical Pathology, Gross And Microscopic Examination For Prostate Needle - 05/05/2020     GU PMH: Prostate Cancer - 06/18/2020, He has T2a Nx Mx Gleason 7(4+3) prostate cancer in 2 cores in his right base nodule and a 3rd core with 20% intraductal disease. I discussed AS, Cryo, HIFU, RALP, Seeds and EXRT with/without adjuvant ADT. I reviewed the risks and benefits of each option in detail. I have recommended he consider EXRT w/wo a seed boost and 6-8 months of ADT but will have him seen by the [facility] to further review his options. He has not had staging as his PSA is low and his probability of LNI is only 7% on the [facility] nomogram  but I will discuss this with Dr. Alinda Money add staging if needed. , - 06/03/2020 ED due to arterial insufficiency, He has severe ED which is not surprising with his history of DM and CAD. - 06/03/2020 Prostate nodule w/ LUTS - 05/05/2020, He has a small firm area at the right base and needs close f/u. , - 2018 BPH w/LUTS (Improving), He is doing well on current therapy. Meds refilled. F/U in 6 months with a PSA for exam. - 09/03/2019, He has not had much improvement in his symptoms or PVR. His  PSA has fallen as expected. I discussed TURP vs more time on the meds. He will return in 6 months for a PVR. , - 03/05/2019, He is voiding ok on alfuzosin with a low PVR and a reasonable stream. He has nocturia primarily but it is not too bothersome. He will elevated his legs at night to see if that will help. , - 2019, The frequency has improved on the alfuzosin but he has had some increased UUI. I will refill the alfuzosin but will have him return for voiding studies in the near future along with cystoscopy., - 2019 (Improving), He is doing well on the alfuzosin alone. he has a small firm area on the right base of the prostate. I am going to refill the alfuzosin and check a PSA today. If the PSA is normal, I will have him return for a repeat exam in 3 months. , - 2018 (Stable), He has a very slow flow and elevated IPSS with visual obstruction on cystoscopy., - 2017, Benign prostatic hypertrophy (BPH) with incomplete bladder emptying, - 2017 Microscopic hematuria, He has 2 renal stones and BPH with BOO but no other abnormalities. - 2019, He has microhematuria today and will his history of stones and increased urgency, I will have him return with a CT for possible cystoscopy., - 2019 Renal calculus, Stable right renal stone about 17mm and a smaller left renal stone. I will have him return in a year with a KUB. - 2019 Urge incontinence (Worsening) - 2019 Nocturia, Nocturia - 2017 Urinary Urgency, Urinary urgency - 2017 Weak Urinary Stream, Weak urinary stream - 2017    NON-GU PMH: Arthritis Coronary Artery Disease Diabetes Type 2 Gout Hypercholesterolemia Myocardial Infarction    FAMILY HISTORY: Alzheimer's Disease - Runs In Family cerebrovascular accident (CVA) - Runs In Family nephrolithiasis - Runs In Family   SOCIAL HISTORY: Marital Status: Married Preferred Language: English; Race: White Current Smoking Status: Patient does not smoke anymore. Has not smoked since 02/28/2000. Smoked 1 pack per  day.   Tobacco Use Assessment Completed: Used Tobacco in last 30 days? Does not use smokeless tobacco. Has not drank since 02/27/2013. Had 5 drinks per month. Does not use drugs. Does not drink caffeine. Has not had a blood transfusion. Patient's occupation is/was retired.     Notes: Married, Alcohol use, Number of children, Former smoker, Retired, Caffeine use   REVIEW OF SYSTEMS:    GU Review Male:   Patient denies frequent urination, hard to postpone urination, burning/ pain with urination, get up at night to urinate, leakage of urine, stream starts and stops, trouble starting your streams, and have to strain to urinate .  Gastrointestinal (Upper):   Patient denies nausea and vomiting.  Gastrointestinal (Lower):   Patient denies diarrhea and constipation.  Constitutional:   Patient denies fever, night sweats, weight loss, and fatigue.  Skin:   Patient denies skin rash/ lesion and itching.  Eyes:  Patient denies blurred vision and double vision.  Ears/ Nose/ Throat:   Patient denies sinus problems and sore throat.  Hematologic/Lymphatic:   Patient denies swollen glands and easy bruising.  Cardiovascular:   Patient denies leg swelling and chest pains.  Respiratory:   Patient denies cough and shortness of breath.  Endocrine:   Patient denies excessive thirst.  Musculoskeletal:   Patient denies back pain and joint pain.  Neurological:   Patient denies headaches and dizziness.  Psychologic:   Patient denies depression and anxiety.   VITAL SIGNS: None   MULTI-SYSTEM PHYSICAL EXAMINATION:    Constitutional: Well-nourished. No physical deformities. Normally developed. Good grooming.     Complexity of Data:  Lab Test Review:   PSA  Records Review:   Pathology Reports  X-Ray Review: C.T. Abdomen/Pelvis: Reviewed Films.  Bone Scan: Reviewed Films.     03/03/20 02/27/19 12/04/18 04/09/17  PSA  Total PSA 1.32 ng/mL 1.17 ng/mL 2.66 ng/mL 1.24 ng/mL    PROCEDURES: None    ASSESSMENT:      ICD-10 Details  1 GU:   Prostate Cancer - C61    PLAN:           Document Letter(s):  Created for Patient: Clinical Summary         Notes:   1. Prostate cancer: We reviewed options for therapy of curative intent with Mr. Arviso today. He has also been seen by Dr. Tammi Klippel and Dr. Alen Blew. The patient was counseled about the natural history of prostate cancer and the standard treatment options that are available for prostate cancer. It was explained to him how his age and life expectancy, clinical stage, Gleason score, and PSA affect his prognosis, the decision to proceed with additional staging studies, as well as how that information influences recommended treatment strategies. We discussed the roles for active surveillance, radiation therapy, surgical therapy, androgen deprivation, as well as ablative therapy options for the treatment of prostate cancer as appropriate to his individual cancer situation. We discussed the risks and benefits of these options with regard to their impact on cancer control and also in terms of potential adverse events, complications, and impact on quality of life particularly related to urinary and sexual function. The patient was encouraged to ask questions throughout the discussion today and all questions were answered to his stated satisfaction. In addition, the patient was provided with and/or directed to appropriate resources and literature for further education about prostate cancer and treatment options.   After reviewing his available options, he is most interested in short-term androgen deprivation therapy and external beam radiation. He is not particularly interested in surgical therapy considering his cardiac history and advanced age. He did express interest in having further discussion with Dr. Jeffie Pollock before confirming his plan for treatment. I will notify Dr. Jeffie Pollock to this effect. All questions have been answered to his stated satisfaction  today.   Cc: Dr. Queen Blossom, PA-C  Dr. Tyler Pita  Dr. Zola Button         Next Appointment:      Next Appointment: 06/29/2020 01:45 PM    Appointment Type: Block Time    Location: [facility] [phone]    Provider: Raynelle Bring, M.D.    Reason for Visit: [place]: We spent 48 minutes dedicated to evaluation and management time, including face to face interaction, discussions on coordination of care, documentation, result review, and discussion with others  as applicable.

## 2020-06-29 NOTE — Progress Notes (Signed)
Reason for the request:    Prostate cancer  HPI: I was asked by Dr. Jeffie Pollock to evaluate [name] for prostate cancer.  He is a 74 year old man with history of coronary artery disease but overall in reasonably safe.  He was found to have elevated PSA of 1.32 on finasteride.  He was evaluated by Dr. Jeffie Pollock and digital rectal examination showed a 12 mm nodule at the right base.  He subsequently underwent a prostate biopsy on May 05, 2020 which showed Gleason score 4+3 equal 7 with a small component of intraductal carcinoma.  He also underwent staging work-up including CT scan and bone scan which showed no evidence of metastatic disease.  He did report symptoms of nocturia which has improved as of late after his prostate biopsy.  He denies any hematuria dysuria or frequency.  Performance status quality of life remains excellent.   He does not report any headaches, blurry vision, syncope or seizures. Does not report any fevers, chills or sweats.  Does not report any cough, wheezing or hemoptysis.  Does not report any chest pain, palpitation, orthopnea or leg edema.  Does not report any nausea, vomiting or abdominal pain.  Does not report any constipation or diarrhea.  Does not report any skeletal complaints.    Does not report frequency, urgency or hematuria.  Does not report any skin rashes or lesions. Does not report any heat or cold intolerance.  Does not report any lymphadenopathy or petechiae.  Does not report any anxiety or depression.  Remaining review of systems is negative.    Past Medical History:  Diagnosis Date  . History of kidney stones   . Myocardial infarction ([place]) 04/2017  . Prostate cancer ([place]) 05/2020  . Statin intolerance    Used >2 different statins  :  Past Surgical History:  Procedure Laterality Date  . COLONOSCOPY     March of 2018   . CORONARY ANGIOPLASTY WITH STENT PLACEMENT  05/24/2017  . CORONARY STENT INTERVENTION N/A 05/24/2017   Procedure: CORONARY STENT  INTERVENTION;  Surgeon: Nigel Mormon, MD;  Location: [facility];  Service: Cardiovascular;  Laterality: N/A;  MID Circumflex 4.0x22 Onyx MID LAD 2.75x15 Onyx  . CYSTOSCOPY WITH URETEROSCOPY, STONE BASKETRY AND STENT PLACEMENT  ~ 1990  . HERNIA REPAIR    . INTRAVASCULAR PRESSURE WIRE/FFR STUDY N/A 05/24/2017   Procedure: INTRAVASCULAR PRESSURE WIRE/FFR STUDY;  Surgeon: Nigel Mormon, MD;  Location: [facility];  Service: Cardiovascular;  Laterality: N/A;  MID LAD  . KNEE ARTHROSCOPY Right 2016  . LEFT HEART CATH AND CORONARY ANGIOGRAPHY N/A 05/24/2017   Procedure: LEFT HEART CATH AND CORONARY ANGIOGRAPHY;  Surgeon: Nigel Mormon, MD;  Location: [facility];  Service: Cardiovascular;  Laterality: N/A;  . 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   Procedure: TOTAL KNEE ARTHROPLASTY;  Surgeon: Paralee Cancel, MD;  Location: [facility];  Service: Orthopedics;  Laterality: Right;  70 mins  . UMBILICAL HERNIA REPAIR  1992  . UPPER GASTROINTESTINAL ENDOSCOPY    :   Current Outpatient Medications:  .  alfuzosin (UROXATRAL) 10 MG 24 hr tablet, Take 10 mg by mouth daily with supper., Disp: , Rfl:  .  allopurinol (ZYLOPRIM) 300 MG tablet, TAKE 1 TABLET (300 MG TOTAL) BY MOUTH DAILY WITH LUNCH., Disp: 90 tablet, Rfl: 0 .  amoxicillin (AMOXIL) 500 MG capsule, TAKE 4 CAPSULES BY MOUTH 1 HOUR PRIOR TO DENTAL WORK, Disp: , Rfl:  .  ASPIRIN 81 81 MG  chewable tablet, CHEW 1 TABLET(S) TWICE A DAY BY ORAL ROUTE FOR 4 WEEKS FOR 30 DAYS., Disp: , Rfl:  .  finasteride (PROSCAR) 5 MG tablet, Take 5 mg by mouth daily., Disp: , Rfl:  .  metoprolol succinate (TOPROL-XL) 25 MG 24 hr tablet, TAKE 1 TABLET EVERY DAY, Disp: 90 tablet, Rfl: 0 .  nitroGLYCERIN (NITROSTAT) 0.4 MG SL tablet, Place 1 tablet (0.4 mg total) under the tongue every 5 (five) minutes x 3 doses as needed for chest pain., Disp: 30 tablet, Rfl: 2 .  ramipril (ALTACE) 5 MG capsule, TAKE 1 CAPSULE BY MOUTH EVERY DAY,  Disp: 90 capsule, Rfl: 1 .  ranolazine (RANEXA) 500 MG 12 hr tablet, TAKE 2 TABLETS BY MOUTH TWICE A DAY, Disp: 360 tablet, Rfl: 1 .  REPATHA SURECLICK 119 MG/ML SOAJ, INJECT 1ML EVERY 2 WEEKS (Patient taking differently: Inject 140 mg into the muscle every 14 (fourteen) days. ), Disp: 8 pen, Rfl: 14:  Allergies  Allergen Reactions  . Crestor [Rosuvastatin Calcium] Anaphylaxis and Swelling    Tongue swells/throat closes  . Statins Other (See Comments)    Muscle/joint pain with ALL other statin drug but Crestor is severe  :  Family History  Problem Relation Age of Onset  . Transient ischemic attack Father 84  . Bone cancer Father   . CAD Mother 18       CABG  . Diabetes Mother   . Breast cancer Neg Hx   . Prostate cancer Neg Hx   . Colon cancer Neg Hx   . Pancreatic cancer Neg Hx   :  Social History   Socioeconomic History  . Marital status: Married    Spouse name: Not on file  . Number of children: 2  . Years of education: Not on file  . Highest education level: Not on file  Occupational History    Comment: retired  Tobacco Use  . Smoking status: Former Smoker    Packs/day: 1.00    Years: 30.00    Pack years: 30.00    Types: Cigarettes    Quit date: 2003    Years since quitting: 18.7  . Smokeless tobacco: Never Used  Vaping Use  . Vaping Use: Never used  Substance and Sexual Activity  . Alcohol use: Not Currently    Comment: has drank since 2014  . Drug use: No  . Sexual activity: Not Currently  Other Topics Concern  . Not on file  Social History Narrative   Married   Son and daughter   Lynnette Caffey   Worked in Research officer, trade union   Social Determinants of Health   Financial Resource Strain:   . Difficulty of Paying Living Expenses: Not on file  Food Insecurity:   . Worried About Charity fundraiser in the Last Year: Not on file  . Ran Out of Food in the Last Year: Not on file  Transportation Needs:   . Lack of Transportation (Medical): Not on file  . Lack  of Transportation (Non-Medical): Not on file  Physical Activity:   . Days of Exercise per Week: Not on file  . Minutes of Exercise per Session: Not on file  Stress:   . Feeling of Stress : Not on file  Social Connections:   . Frequency of Communication with Friends and Family: Not on file  . Frequency of Social Gatherings with Friends and Family: Not on file  . Attends Religious Services: Not on file  . Active Member of Clubs or Organizations:  Not on file  . Attends Archivist Meetings: Not on file  . Marital Status: Not on file  Intimate Partner Violence:   . Fear of Current or Ex-Partner: Not on file  . Emotionally Abused: Not on file  . Physically Abused: Not on file  . Sexually Abused: Not on file  :  Pertinent items are noted in HPI.  Exam: ECOG 0 General appearance: alert and cooperative appeared without distress. Head: atraumatic without any abnormalities. Eyes: conjunctivae/corneas clear. PERRL.  Sclera anicteric. Throat: lips, mucosa, and tongue normal; without oral thrush or ulcers. Resp: clear to auscultation bilaterally without rhonchi, wheezes or dullness to percussion. Cardio: regular rate and rhythm, S1, S2 normal, no murmur, click, rub or gallop GI: soft, non-tender; bowel sounds normal; no masses,  no organomegaly Skin: Skin color, texture, turgor normal. No rashes or lesions Lymph nodes: Cervical, supraclavicular, and axillary nodes normal. Neurologic: Grossly normal without any motor, sensory or deep tendon reflexes. Musculoskeletal: No joint deformity or effusion.    NM Bone Scan Whole Body  Result Date: 06/18/2020 CLINICAL DATA:  Prostate cancer, PSA 1.32 on 03/03/2020 EXAM: NUCLEAR MEDICINE WHOLE BODY BONE SCAN TECHNIQUE: Whole body anterior and posterior images were obtained approximately 3 hours after intravenous injection of radiopharmaceutical. RADIOPHARMACEUTICALS:  20.0 mCi Technetium-96m MDP IV COMPARISON:  None Correlation: CT abdomen  and pelvis 06/18/2020 FINDINGS: Photopenic defect at RIGHT knee from knee prosthesis. Minimal uptake of tracer adjacent to the knee prosthesis is consistent with relative recent RIGHT total knee arthroplasty in January 2021. Uptake at the shoulders and sternoclavicular joints as well as LEFT wrist, typically degenerative. No worrisome sites of abnormal osseous tracer accumulation are identified to suggest osseous metastatic disease. Expected urinary tract and soft tissue distribution of tracer. IMPRESSION: No scintigraphic evidence of osseous metastatic disease. Electronically Signed   By: Lavonia Dana M.D.   On: 06/18/2020 17:18    Assessment and Plan:     74 year old with prostate cancer diagnosed August 2021.  He was found to have a Gleason score 4+3 equal 7 in 2 cores with intraductal component.  His PSA is 1.32 on finasteride.  His staging work-up did not show any evidence of metastatic disease.    His case was discussed today in the prostate cancer multidisciplinary clinic.  And treatment options were reviewed.  His pathology results were also reviewed with the reviewing pathologist and imaging studies were reviewed with radiology.  Treatment options include surgery versus radiation were discussed today in detail.  Both options offer curative treatment with reasonable chance of cure.  Complications associated with both treatments were also reviewed and will be discussed further by Dr. Alinda Money and Dr. Tammi Klippel.  The role for systemic therapy including androgen deprivation and other options were reviewed today.  It is reasonable to defer these options unless he developed more advanced disease in the future.  All his questions were answered today to his satisfaction.   45  minutes were dedicated to this visit. The time was spent on reviewing laboratory data, imaging studies, discussing treatment options, and answering questions regarding future plan.     A copy of this consult has been forwarded to  the requesting physician.

## 2020-06-29 NOTE — Progress Notes (Signed)
° °                              Care Plan Summary  Name: Nicholas Maxwell DOB: [birth year]   Your Medical Team:   Urologist -  Dr. Raynelle Bring, [facility]  Radiation Oncologist - Dr. Tyler Pita, [facility]   Medical Oncologist - Dr. Zola Button, [place] [facility]  Recommendations: 1) Seed implant 2) Radiation    * These recommendations are based on information available as of todays consult.      Recommendations may change depending on the results of further tests or exams.    Next Steps: 1) Consider your options and call Cira Rue, RN    When appointments need to be scheduled, you will be contacted by [facility] and/or [facility].  Questions?  Please do not hesitate to call Cira Rue, RN, BSN, OCN at [phone] any questions or concerns.  Shirlean Mylar is your Oncology Nurse Navigator and is available to assist you while youre receiving your medical care at [facility].

## 2020-06-30 DIAGNOSIS — Z87442 Personal history of urinary calculi: Secondary | ICD-10-CM | POA: Diagnosis not present

## 2020-06-30 DIAGNOSIS — N1832 Chronic kidney disease, stage 3b: Secondary | ICD-10-CM | POA: Diagnosis not present

## 2020-06-30 DIAGNOSIS — E785 Hyperlipidemia, unspecified: Secondary | ICD-10-CM | POA: Diagnosis not present

## 2020-06-30 DIAGNOSIS — I129 Hypertensive chronic kidney disease with stage 1 through stage 4 chronic kidney disease, or unspecified chronic kidney disease: Secondary | ICD-10-CM | POA: Diagnosis not present

## 2020-06-30 DIAGNOSIS — M1000 Idiopathic gout, unspecified site: Secondary | ICD-10-CM | POA: Diagnosis not present

## 2020-06-30 DIAGNOSIS — E1122 Type 2 diabetes mellitus with diabetic chronic kidney disease: Secondary | ICD-10-CM | POA: Diagnosis not present

## 2020-06-30 DIAGNOSIS — C61 Malignant neoplasm of prostate: Secondary | ICD-10-CM | POA: Diagnosis not present

## 2020-06-30 DIAGNOSIS — I2510 Atherosclerotic heart disease of native coronary artery without angina pectoris: Secondary | ICD-10-CM | POA: Diagnosis not present

## 2020-07-02 ENCOUNTER — Encounter: Payer: Self-pay | Admitting: Medical Oncology

## 2020-07-02 NOTE — Progress Notes (Signed)
Patient called stating he has decided on 5.5 weeks of radiation. He had requested a follow up with Dr. Jeffie Pollock to furher discuss his options but does not needs this appointment. I will call and cancel if it has been scheduled. We discussed the next step will be gold markers/SpaceOar gel and Enid Derry will call him with appointments. I will continue to follow and asked him to call with questions or concerns.

## 2020-07-05 ENCOUNTER — Telehealth: Payer: Self-pay | Admitting: *Deleted

## 2020-07-05 NOTE — Telephone Encounter (Signed)
CALLED PATIENT TO INFORM OF ADT APPT. FOR 07-07-20 - ARRIVAL TIME- 3 PM @ DR. WRENN'S OFFICE ([facility]), LVM FOR A RETURN CALL

## 2020-07-07 DIAGNOSIS — R3915 Urgency of urination: Secondary | ICD-10-CM | POA: Diagnosis not present

## 2020-07-07 DIAGNOSIS — N403 Nodular prostate with lower urinary tract symptoms: Secondary | ICD-10-CM | POA: Diagnosis not present

## 2020-07-07 DIAGNOSIS — C61 Malignant neoplasm of prostate: Secondary | ICD-10-CM | POA: Diagnosis not present

## 2020-07-07 DIAGNOSIS — R351 Nocturia: Secondary | ICD-10-CM | POA: Diagnosis not present

## 2020-07-07 DIAGNOSIS — R3912 Poor urinary stream: Secondary | ICD-10-CM | POA: Diagnosis not present

## 2020-07-19 ENCOUNTER — Encounter: Payer: Self-pay | Admitting: Physician Assistant

## 2020-07-19 DIAGNOSIS — N1832 Chronic kidney disease, stage 3b: Secondary | ICD-10-CM | POA: Insufficient documentation

## 2020-07-26 ENCOUNTER — Encounter: Payer: Self-pay | Admitting: Medical Oncology

## 2020-07-27 DIAGNOSIS — N200 Calculus of kidney: Secondary | ICD-10-CM | POA: Diagnosis not present

## 2020-08-05 ENCOUNTER — Other Ambulatory Visit: Payer: Self-pay | Admitting: Urology

## 2020-08-05 ENCOUNTER — Telehealth: Payer: Self-pay | Admitting: *Deleted

## 2020-08-05 DIAGNOSIS — C61 Malignant neoplasm of prostate: Secondary | ICD-10-CM

## 2020-08-05 NOTE — Telephone Encounter (Signed)
Called patient to inform of fid. Markers and space oar placement on 09-09-20 @ [facility] and his sim on 09-14-20 @ Dr. Johny Shears Office, lvm for  a return call

## 2020-08-10 ENCOUNTER — Other Ambulatory Visit: Payer: Self-pay | Admitting: Physician Assistant

## 2020-08-13 DIAGNOSIS — C61 Malignant neoplasm of prostate: Secondary | ICD-10-CM | POA: Diagnosis not present

## 2020-08-13 LAB — TESTOSTERONE: Testosterone: 14.1

## 2020-08-13 LAB — PSA: PSA: 1.48

## 2020-08-17 DIAGNOSIS — N403 Nodular prostate with lower urinary tract symptoms: Secondary | ICD-10-CM | POA: Diagnosis not present

## 2020-08-17 DIAGNOSIS — C61 Malignant neoplasm of prostate: Secondary | ICD-10-CM | POA: Diagnosis not present

## 2020-08-17 DIAGNOSIS — R351 Nocturia: Secondary | ICD-10-CM | POA: Diagnosis not present

## 2020-08-18 DIAGNOSIS — N1832 Chronic kidney disease, stage 3b: Secondary | ICD-10-CM | POA: Diagnosis not present

## 2020-09-01 DIAGNOSIS — C61 Malignant neoplasm of prostate: Secondary | ICD-10-CM | POA: Diagnosis not present

## 2020-09-01 LAB — PSA: PSA: 1.04

## 2020-09-01 LAB — TESTOSTERONE: Testosterone: 18

## 2020-09-04 IMAGING — DX DG CHEST 1V PORT
1 series · 1 of 1 positions shown · non-contrast
Comparison: May 10, 2017.

CLINICAL DATA: Shortness of breath, 787NL-IF positive.

EXAM:
PORTABLE CHEST 1 VIEW

[chest]
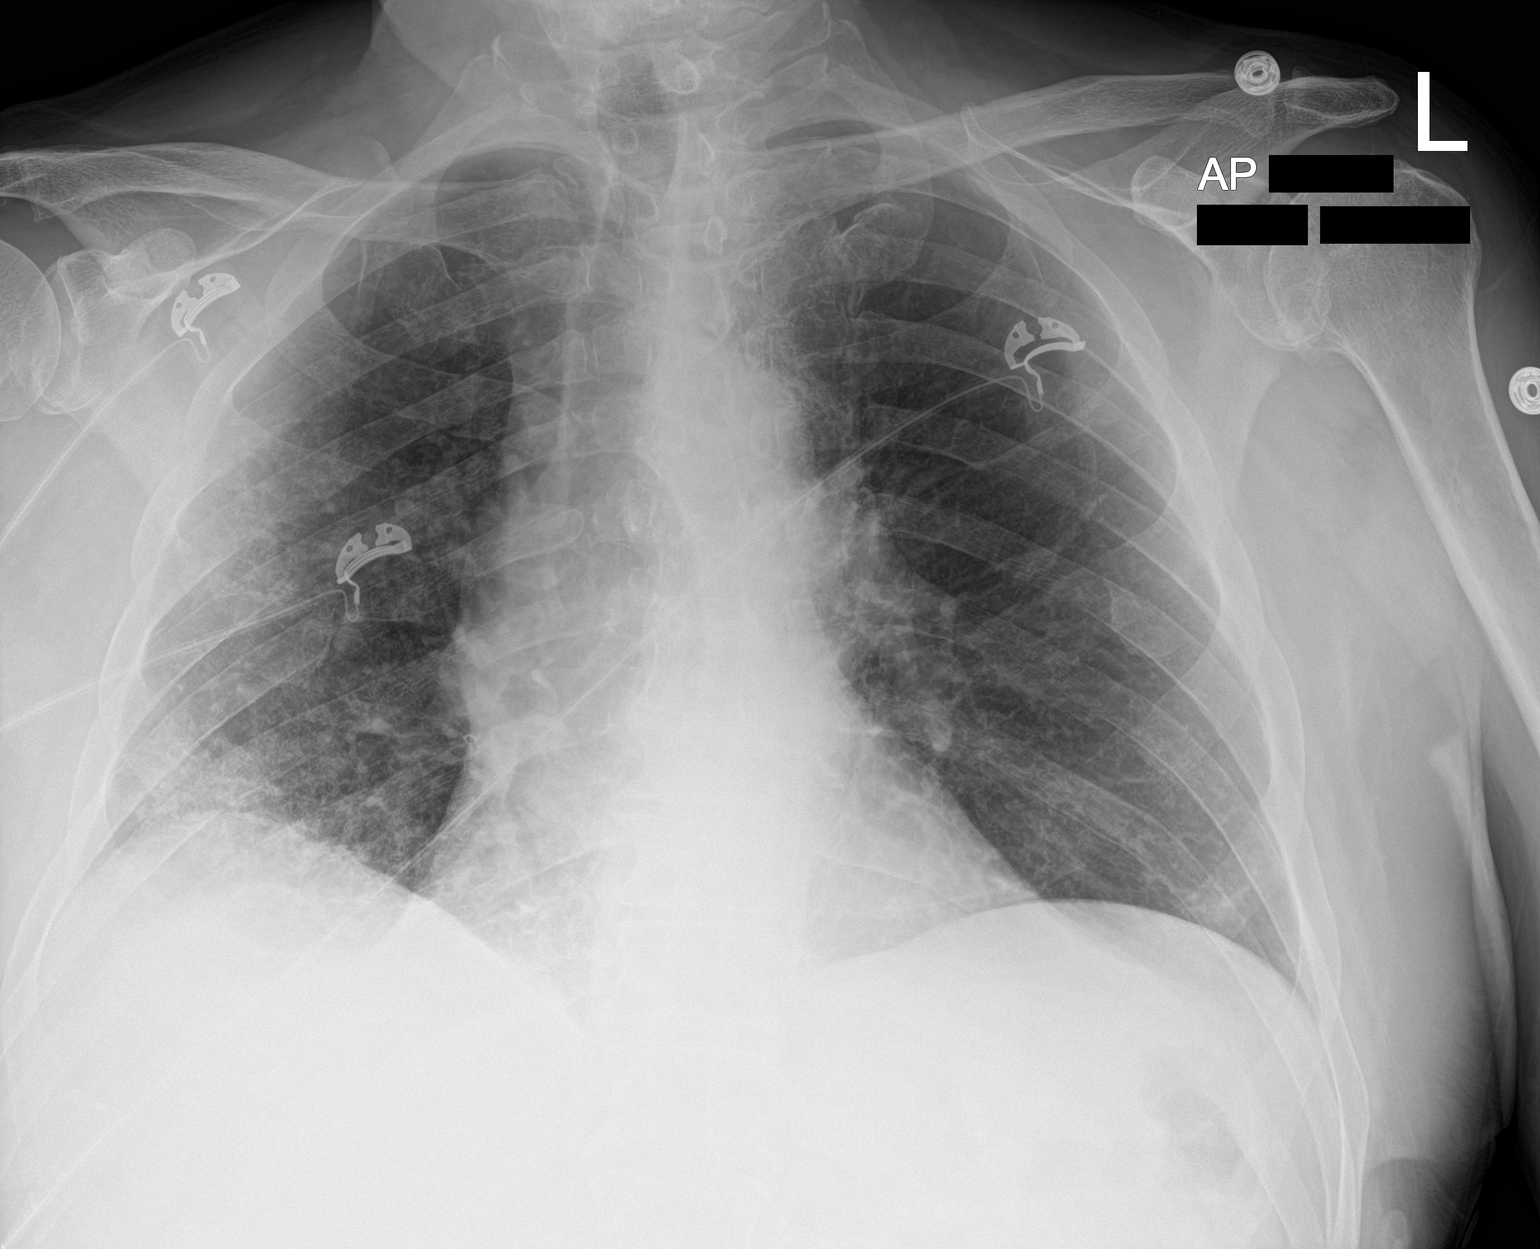

[1 of 1 positions shown; findings below may reference images not displayed]

FINDINGS: The heart size and mediastinal contours are within normal limits. No
pneumothorax or pleural effusion is noted. Left lung is clear. Mild
right basilar atelectasis or infiltrate is noted. The visualized
skeletal structures are unremarkable.
IMPRESSION: Mild right basilar atelectasis or infiltrate is noted. Followup
radiographs are recommended until resolution.

## 2020-09-05 ENCOUNTER — Other Ambulatory Visit: Payer: Self-pay | Admitting: Cardiology

## 2020-09-05 DIAGNOSIS — I2510 Atherosclerotic heart disease of native coronary artery without angina pectoris: Secondary | ICD-10-CM

## 2020-09-07 ENCOUNTER — Encounter: Payer: Self-pay | Admitting: Physician Assistant

## 2020-09-07 ENCOUNTER — Ambulatory Visit (INDEPENDENT_AMBULATORY_CARE_PROVIDER_SITE_OTHER): Payer: PPO | Admitting: Physician Assistant

## 2020-09-07 ENCOUNTER — Other Ambulatory Visit: Payer: Self-pay

## 2020-09-07 VITALS — BP 110/60 | HR 57 | Temp 97.2°F | Ht 69.0 in | Wt 201.2 lb

## 2020-09-07 DIAGNOSIS — E782 Mixed hyperlipidemia: Secondary | ICD-10-CM | POA: Diagnosis not present

## 2020-09-07 DIAGNOSIS — E119 Type 2 diabetes mellitus without complications: Secondary | ICD-10-CM | POA: Diagnosis not present

## 2020-09-07 DIAGNOSIS — I10 Essential (primary) hypertension: Secondary | ICD-10-CM

## 2020-09-07 DIAGNOSIS — E663 Overweight: Secondary | ICD-10-CM

## 2020-09-07 DIAGNOSIS — Z0000 Encounter for general adult medical examination without abnormal findings: Secondary | ICD-10-CM | POA: Diagnosis not present

## 2020-09-07 DIAGNOSIS — C61 Malignant neoplasm of prostate: Secondary | ICD-10-CM

## 2020-09-07 NOTE — Patient Instructions (Signed)

## 2020-09-07 NOTE — Progress Notes (Signed)
I acted as a Education administrator for [employer], PA-C Anselmo Pickler, LPN   Subjective:    Nicholas Maxwell is a 74 y.o. male and is here for a comprehensive physical exam.   HPI  Health Maintenance Due  Topic Date Due  . OPHTHALMOLOGY EXAM  Never done  . URINE MICROALBUMIN  Never done  . FOOT EXAM  11/21/2019   Acute Concerns: None  Chronic Issues: HLD -- taking repatha 140 mg q 14 days. Tolerating well. Diabetes -- 3 month follow-up. Current DM meds: none. Blood sugars at home are: around 140s this AM. Denies: hypoglycemic or hyperglycemic episodes or symptoms. This patient's diabetes is complicated by HTN, CKD. HTN -- Currently taking toprol xl 25 mg. At home blood pressure readings are: well controlled. Patient denies chest pain, SOB, blurred vision, dizziness, unusual headaches, lower leg swelling. Patient is compliant with medication. Denies excessive caffeine intake, stimulant usage, excessive alcohol intake, or increase in salt consumption.  BP Readings from Last 3 Encounters:  09/07/20 110/60  06/24/20 (!) 108/56  06/08/20 130/70   Prostate CA -- managed by Dr. Jeffie Pollock. Planning for prostate marker placement this week as well as additional testing/interventions later this month, likely including radiation.  Health Maintenance: Immunizations -- UTD, declines COVID Colonoscopy -- UTD, due 11/2021 PSA -- done 03/2020  Diet -- well balanced overall Sleep habits -- no concerns Exercise -- goes to the pool at [facility] Weight -- Weight: 201 lb 4 oz (91.3 kg)  Recent weight history Wt Readings from Last 10 Encounters:  09/07/20 201 lb 4 oz (91.3 kg)  06/24/20 199 lb (90.3 kg)  06/08/20 197 lb (89.4 kg)  04/20/20 196 lb 6.1 oz (89.1 kg)  03/03/20 196 lb 4 oz (89 kg)  12/24/19 200 lb 3.2 oz (90.8 kg)  10/23/19 201 lb 8 oz (91.4 kg)  10/21/19 201 lb 9.6 oz (91.4 kg)  09/12/19 195 lb (88.5 kg)  09/01/19 204 lb (92.5 kg)   Body mass index is 29.72 kg/m. Mood -- stable Alcohol  use -- none Tobacco use -- none  Depression screen PHQ 2/9 09/07/2020  Decreased Interest 0  Down, Depressed, Hopeless 0  PHQ - 2 Score 0    Other providers/specialists: Patient Care Team: [name], [place] as PCP - General (Physician Assistant) [name], MD as Attending Physician (Urology) Thalia Bloodgood, [place] as Referring Physician (Optometry) Newell Coral., MD as Referring Physician (Gastroenterology) Nigel Mormon, MD as Consulting Physician (Cardiology) Lenard Simmer, [facility] (Gastroenterology) Paralee Cancel, MD as Consulting Physician (Orthopedic Surgery) [name], MD as Attending Physician (Urology) Cira Rue, RN Nurse Navigator as Registered Nurse (Medical Oncology)      PMHx, SurgHx, SocialHx, Medications, and Allergies were reviewed in the Visit Navigator and updated as appropriate.   Past Medical History:  Diagnosis Date  . History of kidney stones   . Myocardial infarction ([place]) 04/2017  . Prostate cancer ([place]) 05/2020  . Statin intolerance    Used >2 different statins     Past Surgical History:  Procedure Laterality Date  . COLONOSCOPY     March of 2018   . CORONARY ANGIOPLASTY WITH STENT PLACEMENT  05/24/2017  . CORONARY STENT INTERVENTION N/A 05/24/2017   Procedure: CORONARY STENT INTERVENTION;  Surgeon: Nigel Mormon, MD;  Location: [facility];  Service: Cardiovascular;  Laterality: N/A;  MID Circumflex 4.0x22 Onyx MID LAD 2.75x15 Onyx  . CYSTOSCOPY WITH URETEROSCOPY, STONE BASKETRY AND STENT PLACEMENT  ~ 1990  .  HERNIA REPAIR    . INTRAVASCULAR PRESSURE WIRE/FFR STUDY N/A 05/24/2017   Procedure: INTRAVASCULAR PRESSURE WIRE/FFR STUDY;  Surgeon: Nigel Mormon, MD;  Location: [facility];  Service: Cardiovascular;  Laterality: N/A;  MID LAD  . KNEE ARTHROSCOPY Right 2016  . LEFT HEART CATH AND CORONARY ANGIOGRAPHY N/A 05/24/2017   Procedure: LEFT HEART CATH AND CORONARY ANGIOGRAPHY;  Surgeon:  Nigel Mormon, MD;  Location: [facility];  Service: Cardiovascular;  Laterality: N/A;  . 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   Procedure: TOTAL KNEE ARTHROPLASTY;  Surgeon: Paralee Cancel, MD;  [facility]: [facility];  Service: Orthopedics;  Laterality: Right;  70 mins  . UMBILICAL HERNIA REPAIR  1992  . UPPER GASTROINTESTINAL ENDOSCOPY       Family History  Problem Relation Age of Onset  . Transient ischemic attack Father 60  . Bone cancer Father   . CAD Mother 50       CABG  . Diabetes Mother   . Breast cancer Neg Hx   . Colon cancer Neg Hx   . Pancreatic cancer Neg Hx   . Prostate cancer Neg Hx     Social History   Tobacco Use  . Smoking status: Former Smoker    Packs/day: 1.00    Years: 30.00    Pack years: 30.00    Types: Cigarettes    Quit date: 2003    Years since quitting: 18.9  . Smokeless tobacco: Never Used  Vaping Use  . Vaping Use: Never used  Substance Use Topics  . Alcohol use: Not Currently    Comment: has drank since 2014  . Drug use: No    Review of Systems:   Review of Systems  Constitutional: Negative.  Negative for chills, fever, malaise/fatigue and weight loss.  HENT: Negative.  Negative for hearing loss, sinus pain and sore throat.   Eyes: Negative.  Negative for blurred vision.  Respiratory: Negative.  Negative for cough and shortness of breath.   Cardiovascular: Negative.  Negative for chest pain, palpitations and leg swelling.  Gastrointestinal: Negative.  Negative for abdominal pain, constipation, diarrhea, heartburn, nausea and vomiting.  Genitourinary: Negative.  Negative for dysuria, frequency and urgency.  Musculoskeletal: Negative.  Negative for back pain, myalgias and neck pain.  Skin: Negative.  Negative for itching and rash.  Neurological: Negative.  Negative for dizziness, tingling, seizures, loss of consciousness and headaches.  Endo/Heme/Allergies: Negative.  Negative for polydipsia.  Psychiatric/Behavioral:  Negative.  Negative for depression. The patient is not nervous/anxious.     Objective:    Vitals:   09/07/20 1303  BP: 110/60  Pulse: (!) 57  Temp: (!) 97.2 F (36.2 C)  SpO2: 96%   Body mass index is 29.72 kg/m.  General  Alert, cooperative, no distress, appears stated age  Head:  Normocephalic, without obvious abnormality, atraumatic  Eyes:  PERRL, conjunctiva/corneas clear, EOM's intact, fundi benign, both eyes       Ears:  Normal TM's and external ear canals, both ears  Nose: Nares normal, septum midline, mucosa normal, no drainage or sinus tenderness  Throat: Lips, mucosa, and tongue normal; teeth and gums normal  Neck: Supple, symmetrical, trachea midline, no adenopathy;     thyroid:  No enlargement/tenderness/nodules; no carotid bruit or JVD  Back:   Symmetric, no curvature, ROM normal, no CVA tenderness  Lungs:   Clear to auscultation bilaterally, respirations unlabored  Chest wall:  No tenderness or deformity  Heart:  Regular rate and rhythm,  S1 and S2 normal, no murmur, rub or gallop  Abdomen:   Soft, non-tender, bowel sounds active all four quadrants, no masses, no organomegaly  Extremities: Extremities normal, atraumatic, no cyanosis or edema  Prostate : Not done   Skin: Skin color, texture, turgor normal, no rashes or lesions  Lymph nodes: Cervical, supraclavicular, and axillary nodes normal  Neurologic: CNII-XII grossly intact. Normal strength, sensation and reflexes throughout   AssessmentPlan:   Javoni was seen today for annual exam.  Diagnoses and all orders for this visit:  Routine physical examination Today patient counseled on age appropriate routine health concerns for screening and prevention, each reviewed and up to date or declined. Immunizations reviewed and up to date or declined. Labs ordered and reviewed. Risk factors for depression reviewed and negative. Hearing function and visual acuity are intact. ADLs screened and addressed as needed.  Functional ability and level of safety reviewed and appropriate. Education, counseling and referrals performed based on assessed risks today. Patient provided with a copy of personalized plan for preventive services.  Type 2 diabetes mellitus without complication, without long-term current use of insulin (HCC) Update A1c today. Further recommendations based upon lab results. -     Hemoglobin A1c; Future  Essential hypertension Well controlled. Continue current metoprolol 25 mg xl daily. -     CBC with Differential/Platelet; Future -     Comprehensive metabolic panel; Future  Malignant neoplasm of prostate [facility]) Management per urology.  Mixed hyperlipidemia Mgmt per cardiology. -     Lipid panel; Future  Overweight Continue healthy diet and exercise.    Well Adult Exam: Labs ordered: Yes. Patient counseling was done. See below for items discussed.   Discussed the patient's BMI.  The BMI BMI is not in the acceptable range; BMI management plan is completed   Follow up in one year.   Patient Counseling: [x]   Nutrition: Stressed importance of moderation in sodium/caffeine intake, saturated fat and cholesterol, caloric balance, sufficient intake of fresh fruits, vegetables, and fiber  [x]   Stressed the importance of regular exercise.   []   Substance Abuse: Discussed cessation/primary prevention of tobacco, alcohol, or other drug use; driving or other dangerous activities under the influence; availability of treatment for abuse.   [x]   Injury prevention: Discussed safety belts, safety helmets, smoke detector, smoking near bedding or upholstery.   []   Sexuality: Discussed sexually transmitted diseases, partner selection, use of condoms, avoidance of unintended pregnancy  and contraceptive alternatives.   [x]   Dental health: Discussed importance of regular tooth brushing, flossing, and dental visits.  [x]   Health maintenance and immunizations reviewed. Please refer to Health maintenance  section.   CMA or LPN served as scribe during this visit. History, Physical, and Plan performed by medical provider. The above documentation has been reviewed and is accurate and complete.  [name], PA-C [place]

## 2020-09-08 LAB — HEMOGLOBIN A1C
Hgb A1c MFr Bld: 6.1 % of total Hgb — ABNORMAL HIGH (ref <5.7)
Mean Plasma Glucose: 128 mg/dL
eAG (mmol/L): 7.1 mmol/L

## 2020-09-08 LAB — CBC WITH DIFFERENTIAL/PLATELET
Absolute Monocytes: 470 cells/uL (ref 200–950)
Basophils Absolute: 22 cells/uL (ref 0–200)
Basophils Relative: 0.4 %
Eosinophils Absolute: 39 cells/uL (ref 15–500)
Eosinophils Relative: 0.7 %
HCT: 34.6 % — ABNORMAL LOW (ref 38.5–50.0)
Hemoglobin: 12.1 g/dL — ABNORMAL LOW (ref 13.2–17.1)
Lymphs Abs: 1523 cells/uL (ref 850–3900)
MCH: 34.8 pg — ABNORMAL HIGH (ref 27.0–33.0)
MCHC: 35 g/dL (ref 32.0–36.0)
MCV: 99.4 fL (ref 80.0–100.0)
MPV: 10 fL (ref 7.5–12.5)
Monocytes Relative: 8.4 %
Neutro Abs: 3545 cells/uL (ref 1500–7800)
Neutrophils Relative %: 63.3 %
Platelets: 218 10*3/uL (ref 140–400)
RBC: 3.48 10*6/uL — ABNORMAL LOW (ref 4.20–5.80)
RDW: 12.2 % (ref 11.0–15.0)
Total Lymphocyte: 27.2 %
WBC: 5.6 10*3/uL (ref 3.8–10.8)

## 2020-09-08 LAB — COMPREHENSIVE METABOLIC PANEL
AG Ratio: 1.7 (calc) (ref 1.0–2.5)
ALT: 14 U/L (ref 9–46)
AST: 14 U/L (ref 10–35)
Albumin: 4.2 g/dL (ref 3.6–5.1)
Alkaline phosphatase (APISO): 53 U/L (ref 35–144)
BUN/Creatinine Ratio: 22 (calc) (ref 6–22)
BUN: 31 mg/dL — ABNORMAL HIGH (ref 7–25)
CO2: 29 mmol/L (ref 20–32)
Calcium: 9.5 mg/dL (ref 8.6–10.3)
Chloride: 103 mmol/L (ref 98–110)
Creat: 1.41 mg/dL — ABNORMAL HIGH (ref 0.70–1.18)
Globulin: 2.5 g/dL (calc) (ref 1.9–3.7)
Glucose, Bld: 95 mg/dL (ref 65–99)
Potassium: 4.8 mmol/L (ref 3.5–5.3)
Sodium: 139 mmol/L (ref 135–146)
Total Bilirubin: 0.5 mg/dL (ref 0.2–1.2)
Total Protein: 6.7 g/dL (ref 6.1–8.1)

## 2020-09-08 LAB — LIPID PANEL
Cholesterol: 132 mg/dL (ref <200)
HDL: 45 mg/dL (ref >=40)
LDL Cholesterol (Calc): 57 mg/dL (calc)
Non-HDL Cholesterol (Calc): 87 mg/dL (calc) (ref <130)
Total CHOL/HDL Ratio: 2.9 (calc) (ref <5.0)
Triglycerides: 237 mg/dL — ABNORMAL HIGH (ref <150)

## 2020-09-09 DIAGNOSIS — C61 Malignant neoplasm of prostate: Secondary | ICD-10-CM | POA: Diagnosis not present

## 2020-09-13 ENCOUNTER — Telehealth: Payer: Self-pay | Admitting: *Deleted

## 2020-09-13 NOTE — Telephone Encounter (Signed)
CALLED PATIENT TO REMIND OF SIM FOR 09-14-20, AND MRI FOR 09-15-20- ARRIVAL TIME- 3:30 PM @ [facility], SPOKE WITH PATIENT AND HE IS AWARE OF THESE APPTS.

## 2020-09-14 ENCOUNTER — Other Ambulatory Visit: Payer: Self-pay | Admitting: Cardiology

## 2020-09-14 ENCOUNTER — Encounter: Payer: Self-pay | Admitting: Medical Oncology

## 2020-09-14 ENCOUNTER — Ambulatory Visit
Admission: RE | Admit: 2020-09-14 | Discharge: 2020-09-14 | Disposition: A | Discharge status: Home / Self Care | Payer: PPO | Source: Ambulatory Visit | Attending: Radiation Oncology | Admitting: Radiation Oncology

## 2020-09-14 DIAGNOSIS — Z510 Encounter for antineoplastic radiation therapy: Secondary | ICD-10-CM | POA: Diagnosis not present

## 2020-09-14 DIAGNOSIS — C61 Malignant neoplasm of prostate: Secondary | ICD-10-CM | POA: Diagnosis not present

## 2020-09-14 NOTE — Progress Notes (Signed)
  Radiation Oncology         [id number]) [phone] ________________________________  Name: Nicholas Maxwell MRN: [id number]  Date: 09/14/2020  DOB: Jun 01, 1946  SIMULATION AND TREATMENT PLANNING NOTE    ICD-10-CM   1. Malignant neoplasm of prostate ([place])  C61     DIAGNOSIS:  74 y.o. 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  Identity was confirmed.  All relevant records and images related to the planned course of therapy were reviewed.  The patient freely provided informed written consent to proceed with treatment after reviewing the details related to the planned course of therapy. The consent form was witnessed and verified by the simulation staff.  Then, the patient was set-up in a stable reproducible supine position for radiation therapy.  A vacuum lock pillow device was custom fabricated to position his legs in a reproducible immobilized position.  Then, I performed a urethrogram under sterile conditions to identify the prostatic apex.  CT images were obtained.  Surface markings were placed.  The CT images were loaded into the planning software.  Then the prostate target and avoidance structures including the rectum, bladder, bowel and hips were contoured.  Treatment planning then occurred.  The radiation prescription was entered and confirmed.  A total of one complex treatment devices was fabricated. I have requested : Intensity Modulated Radiotherapy (IMRT) is medically necessary for this case for the following reason:  Rectal sparing.Marland Kitchen  PLAN:  The patient will receive 70 Gy in 28 fractions.  ________________________________  Sheral Apley Tammi Klippel, M.D.

## 2020-09-15 ENCOUNTER — Ambulatory Visit (HOSPITAL_COMMUNITY)
Admission: RE | Admit: 2020-09-15 | Discharge: 2020-09-15 | Disposition: A | Discharge status: Home / Self Care | Payer: PPO | Source: Ambulatory Visit | Attending: Urology | Admitting: Urology

## 2020-09-15 ENCOUNTER — Telehealth: Payer: Self-pay | Admitting: Physician Assistant

## 2020-09-15 ENCOUNTER — Other Ambulatory Visit: Payer: Self-pay

## 2020-09-15 DIAGNOSIS — C61 Malignant neoplasm of prostate: Secondary | ICD-10-CM | POA: Insufficient documentation

## 2020-09-15 NOTE — Progress Notes (Signed)
°  Chronic Care Management   Outreach Note  09/15/2020 Name: Nicholas Maxwell MRN: [id number] DOB: [birth year]  Referred by: [name], [place] Reason for referral : No chief complaint on file.   An unsuccessful telephone outreach was attempted today. The patient was referred to the pharmacist for assistance with care management and care coordination.   Follow Up Plan:   Lauretta Grill [facility] Scheduler

## 2020-09-15 NOTE — Chronic Care Management (AMB) (Signed)
°  Chronic Care Management   Note  09/15/2020 Name: Nicholas Maxwell MRN: [id number] DOB: 1945/11/02  Nicholas Maxwell is a 74 y.o. year old male who is a primary care patient of [name], [place]. I reached out to [name] by phone today in response to a referral sent by Nicholas Maxwell PCP, [name], PA.   Nicholas Maxwell was given information about Chronic Care Management services today including:  1. CCM service includes personalized support from designated clinical staff supervised by his physician, including individualized plan of care and coordination with other care providers 2. 24/7 contact phone numbers for assistance for urgent and routine care needs. 3. Service will only be billed when office clinical staff spend 20 minutes or more in a month to coordinate care. 4. Only one practitioner may furnish and bill the service in a calendar month. 5. The patient may stop CCM services at any time (effective at the end of the month) by phone call to the office staff.   Patient agreed to services and verbal consent obtained.   Follow up plan:   Nicholas Maxwell [facility] Scheduler

## 2020-09-17 DIAGNOSIS — Z5111 Encounter for antineoplastic chemotherapy: Secondary | ICD-10-CM | POA: Diagnosis not present

## 2020-09-17 DIAGNOSIS — Z510 Encounter for antineoplastic radiation therapy: Secondary | ICD-10-CM | POA: Diagnosis not present

## 2020-09-17 DIAGNOSIS — C61 Malignant neoplasm of prostate: Secondary | ICD-10-CM | POA: Diagnosis not present

## 2020-09-22 ENCOUNTER — Other Ambulatory Visit: Payer: Self-pay

## 2020-09-22 ENCOUNTER — Ambulatory Visit
Admission: RE | Admit: 2020-09-22 | Discharge: 2020-09-22 | Disposition: A | Discharge status: Home / Self Care | Payer: PPO | Source: Ambulatory Visit | Attending: Radiation Oncology | Admitting: Radiation Oncology

## 2020-09-22 DIAGNOSIS — Z510 Encounter for antineoplastic radiation therapy: Secondary | ICD-10-CM | POA: Diagnosis not present

## 2020-09-22 DIAGNOSIS — C61 Malignant neoplasm of prostate: Secondary | ICD-10-CM | POA: Diagnosis not present

## 2020-09-23 ENCOUNTER — Ambulatory Visit
Admission: RE | Admit: 2020-09-23 | Discharge: 2020-09-23 | Disposition: A | Discharge status: Home / Self Care | Payer: PPO | Source: Ambulatory Visit | Attending: Radiation Oncology | Admitting: Radiation Oncology

## 2020-09-23 ENCOUNTER — Encounter: Payer: Self-pay | Admitting: Medical Oncology

## 2020-09-23 ENCOUNTER — Other Ambulatory Visit: Payer: Self-pay

## 2020-09-23 DIAGNOSIS — C61 Malignant neoplasm of prostate: Secondary | ICD-10-CM | POA: Diagnosis not present

## 2020-09-23 DIAGNOSIS — Z510 Encounter for antineoplastic radiation therapy: Secondary | ICD-10-CM | POA: Diagnosis not present

## 2020-09-27 ENCOUNTER — Ambulatory Visit
Admission: RE | Admit: 2020-09-27 | Discharge: 2020-09-27 | Disposition: A | Discharge status: Home / Self Care | Payer: PPO | Source: Ambulatory Visit | Attending: Radiation Oncology | Admitting: Radiation Oncology

## 2020-09-27 DIAGNOSIS — Z510 Encounter for antineoplastic radiation therapy: Secondary | ICD-10-CM | POA: Diagnosis not present

## 2020-09-27 DIAGNOSIS — C61 Malignant neoplasm of prostate: Secondary | ICD-10-CM | POA: Diagnosis not present

## 2020-09-28 ENCOUNTER — Ambulatory Visit
Admission: RE | Admit: 2020-09-28 | Discharge: 2020-09-28 | Disposition: A | Discharge status: Home / Self Care | Payer: PPO | Source: Ambulatory Visit | Attending: Radiation Oncology | Admitting: Radiation Oncology

## 2020-09-28 ENCOUNTER — Other Ambulatory Visit: Payer: Self-pay

## 2020-09-28 DIAGNOSIS — Z510 Encounter for antineoplastic radiation therapy: Secondary | ICD-10-CM | POA: Diagnosis not present

## 2020-09-28 DIAGNOSIS — C61 Malignant neoplasm of prostate: Secondary | ICD-10-CM | POA: Diagnosis not present

## 2020-09-29 ENCOUNTER — Ambulatory Visit: Payer: PPO

## 2020-09-29 DIAGNOSIS — I129 Hypertensive chronic kidney disease with stage 1 through stage 4 chronic kidney disease, or unspecified chronic kidney disease: Secondary | ICD-10-CM | POA: Diagnosis not present

## 2020-09-29 DIAGNOSIS — E1122 Type 2 diabetes mellitus with diabetic chronic kidney disease: Secondary | ICD-10-CM | POA: Diagnosis not present

## 2020-09-29 DIAGNOSIS — C61 Malignant neoplasm of prostate: Secondary | ICD-10-CM | POA: Diagnosis not present

## 2020-09-29 DIAGNOSIS — I2510 Atherosclerotic heart disease of native coronary artery without angina pectoris: Secondary | ICD-10-CM | POA: Diagnosis not present

## 2020-09-29 DIAGNOSIS — E785 Hyperlipidemia, unspecified: Secondary | ICD-10-CM | POA: Diagnosis not present

## 2020-09-29 DIAGNOSIS — Z87442 Personal history of urinary calculi: Secondary | ICD-10-CM | POA: Diagnosis not present

## 2020-09-29 DIAGNOSIS — N1832 Chronic kidney disease, stage 3b: Secondary | ICD-10-CM | POA: Diagnosis not present

## 2020-09-29 DIAGNOSIS — M1000 Idiopathic gout, unspecified site: Secondary | ICD-10-CM | POA: Diagnosis not present

## 2020-09-30 ENCOUNTER — Ambulatory Visit
Admission: RE | Admit: 2020-09-30 | Discharge: 2020-09-30 | Disposition: A | Discharge status: Home / Self Care | Payer: PPO | Source: Ambulatory Visit | Attending: Radiation Oncology | Admitting: Radiation Oncology

## 2020-09-30 ENCOUNTER — Other Ambulatory Visit: Payer: Self-pay

## 2020-09-30 DIAGNOSIS — C61 Malignant neoplasm of prostate: Secondary | ICD-10-CM | POA: Diagnosis not present

## 2020-09-30 DIAGNOSIS — Z510 Encounter for antineoplastic radiation therapy: Secondary | ICD-10-CM | POA: Diagnosis not present

## 2020-10-04 ENCOUNTER — Ambulatory Visit
Admission: RE | Admit: 2020-10-04 | Discharge: 2020-10-04 | Disposition: A | Discharge status: Home / Self Care | Payer: PPO | Source: Ambulatory Visit | Attending: Radiation Oncology | Admitting: Radiation Oncology

## 2020-10-04 ENCOUNTER — Other Ambulatory Visit: Payer: Self-pay

## 2020-10-04 DIAGNOSIS — Z510 Encounter for antineoplastic radiation therapy: Secondary | ICD-10-CM | POA: Diagnosis not present

## 2020-10-04 DIAGNOSIS — C61 Malignant neoplasm of prostate: Secondary | ICD-10-CM | POA: Insufficient documentation

## 2020-10-05 ENCOUNTER — Ambulatory Visit
Admission: RE | Admit: 2020-10-05 | Discharge: 2020-10-05 | Disposition: A | Discharge status: Home / Self Care | Payer: PPO | Source: Ambulatory Visit | Attending: Radiation Oncology | Admitting: Radiation Oncology

## 2020-10-05 ENCOUNTER — Other Ambulatory Visit: Payer: Self-pay | Admitting: Cardiology

## 2020-10-05 DIAGNOSIS — Z510 Encounter for antineoplastic radiation therapy: Secondary | ICD-10-CM | POA: Diagnosis not present

## 2020-10-05 DIAGNOSIS — C61 Malignant neoplasm of prostate: Secondary | ICD-10-CM | POA: Diagnosis not present

## 2020-10-06 ENCOUNTER — Ambulatory Visit
Admission: RE | Admit: 2020-10-06 | Discharge: 2020-10-06 | Disposition: A | Discharge status: Home / Self Care | Payer: PPO | Source: Ambulatory Visit | Attending: Radiation Oncology | Admitting: Radiation Oncology

## 2020-10-06 ENCOUNTER — Other Ambulatory Visit: Payer: Self-pay

## 2020-10-06 DIAGNOSIS — Z510 Encounter for antineoplastic radiation therapy: Secondary | ICD-10-CM | POA: Diagnosis not present

## 2020-10-06 DIAGNOSIS — C61 Malignant neoplasm of prostate: Secondary | ICD-10-CM | POA: Diagnosis not present

## 2020-10-07 ENCOUNTER — Ambulatory Visit
Admission: RE | Admit: 2020-10-07 | Discharge: 2020-10-07 | Disposition: A | Discharge status: Home / Self Care | Payer: PPO | Source: Ambulatory Visit | Attending: Radiation Oncology | Admitting: Radiation Oncology

## 2020-10-07 ENCOUNTER — Other Ambulatory Visit: Payer: Self-pay

## 2020-10-07 DIAGNOSIS — Z510 Encounter for antineoplastic radiation therapy: Secondary | ICD-10-CM | POA: Diagnosis not present

## 2020-10-07 DIAGNOSIS — C61 Malignant neoplasm of prostate: Secondary | ICD-10-CM | POA: Diagnosis not present

## 2020-10-08 ENCOUNTER — Ambulatory Visit
Admission: RE | Admit: 2020-10-08 | Discharge: 2020-10-08 | Disposition: A | Discharge status: Home / Self Care | Payer: PPO | Source: Ambulatory Visit | Attending: Radiation Oncology | Admitting: Radiation Oncology

## 2020-10-08 ENCOUNTER — Other Ambulatory Visit: Payer: Self-pay

## 2020-10-08 DIAGNOSIS — C61 Malignant neoplasm of prostate: Secondary | ICD-10-CM | POA: Diagnosis not present

## 2020-10-08 DIAGNOSIS — Z510 Encounter for antineoplastic radiation therapy: Secondary | ICD-10-CM | POA: Diagnosis not present

## 2020-10-11 ENCOUNTER — Ambulatory Visit
Admission: RE | Admit: 2020-10-11 | Discharge: 2020-10-11 | Disposition: A | Discharge status: Home / Self Care | Payer: PPO | Source: Ambulatory Visit | Attending: Radiation Oncology | Admitting: Radiation Oncology

## 2020-10-11 ENCOUNTER — Other Ambulatory Visit: Payer: Self-pay

## 2020-10-11 DIAGNOSIS — Z510 Encounter for antineoplastic radiation therapy: Secondary | ICD-10-CM | POA: Diagnosis not present

## 2020-10-11 DIAGNOSIS — C61 Malignant neoplasm of prostate: Secondary | ICD-10-CM | POA: Diagnosis not present

## 2020-10-12 ENCOUNTER — Other Ambulatory Visit: Payer: Self-pay

## 2020-10-12 ENCOUNTER — Ambulatory Visit
Admission: RE | Admit: 2020-10-12 | Discharge: 2020-10-12 | Disposition: A | Discharge status: Home / Self Care | Payer: PPO | Source: Ambulatory Visit | Attending: Radiation Oncology | Admitting: Radiation Oncology

## 2020-10-12 DIAGNOSIS — C61 Malignant neoplasm of prostate: Secondary | ICD-10-CM | POA: Diagnosis not present

## 2020-10-12 DIAGNOSIS — Z510 Encounter for antineoplastic radiation therapy: Secondary | ICD-10-CM | POA: Diagnosis not present

## 2020-10-13 ENCOUNTER — Ambulatory Visit
Admission: RE | Admit: 2020-10-13 | Discharge: 2020-10-13 | Disposition: A | Discharge status: Home / Self Care | Payer: PPO | Source: Ambulatory Visit | Attending: Radiation Oncology | Admitting: Radiation Oncology

## 2020-10-13 ENCOUNTER — Other Ambulatory Visit: Payer: Self-pay

## 2020-10-13 DIAGNOSIS — C61 Malignant neoplasm of prostate: Secondary | ICD-10-CM | POA: Diagnosis not present

## 2020-10-13 DIAGNOSIS — Z510 Encounter for antineoplastic radiation therapy: Secondary | ICD-10-CM | POA: Diagnosis not present

## 2020-10-13 DIAGNOSIS — N1832 Chronic kidney disease, stage 3b: Secondary | ICD-10-CM | POA: Diagnosis not present

## 2020-10-14 ENCOUNTER — Ambulatory Visit
Admission: RE | Admit: 2020-10-14 | Discharge: 2020-10-14 | Disposition: A | Discharge status: Home / Self Care | Payer: PPO | Source: Ambulatory Visit | Attending: Radiation Oncology | Admitting: Radiation Oncology

## 2020-10-14 ENCOUNTER — Other Ambulatory Visit: Payer: Self-pay

## 2020-10-14 DIAGNOSIS — C61 Malignant neoplasm of prostate: Secondary | ICD-10-CM | POA: Diagnosis not present

## 2020-10-14 DIAGNOSIS — Z510 Encounter for antineoplastic radiation therapy: Secondary | ICD-10-CM | POA: Diagnosis not present

## 2020-10-15 ENCOUNTER — Ambulatory Visit
Admission: RE | Admit: 2020-10-15 | Discharge: 2020-10-15 | Disposition: A | Discharge status: Home / Self Care | Payer: PPO | Source: Ambulatory Visit | Attending: Radiation Oncology | Admitting: Radiation Oncology

## 2020-10-15 ENCOUNTER — Other Ambulatory Visit: Payer: Self-pay

## 2020-10-15 DIAGNOSIS — C61 Malignant neoplasm of prostate: Secondary | ICD-10-CM | POA: Diagnosis not present

## 2020-10-15 DIAGNOSIS — Z510 Encounter for antineoplastic radiation therapy: Secondary | ICD-10-CM | POA: Diagnosis not present

## 2020-10-18 ENCOUNTER — Ambulatory Visit
Admission: RE | Admit: 2020-10-18 | Discharge: 2020-10-18 | Disposition: A | Discharge status: Home / Self Care | Payer: PPO | Source: Ambulatory Visit | Attending: Radiation Oncology | Admitting: Radiation Oncology

## 2020-10-18 ENCOUNTER — Other Ambulatory Visit: Payer: Self-pay

## 2020-10-18 DIAGNOSIS — Z510 Encounter for antineoplastic radiation therapy: Secondary | ICD-10-CM | POA: Diagnosis not present

## 2020-10-18 DIAGNOSIS — C61 Malignant neoplasm of prostate: Secondary | ICD-10-CM | POA: Diagnosis not present

## 2020-10-19 ENCOUNTER — Ambulatory Visit
Admission: RE | Admit: 2020-10-19 | Discharge: 2020-10-19 | Disposition: A | Discharge status: Home / Self Care | Payer: PPO | Source: Ambulatory Visit | Attending: Radiation Oncology | Admitting: Radiation Oncology

## 2020-10-19 DIAGNOSIS — C61 Malignant neoplasm of prostate: Secondary | ICD-10-CM | POA: Diagnosis not present

## 2020-10-19 DIAGNOSIS — Z510 Encounter for antineoplastic radiation therapy: Secondary | ICD-10-CM | POA: Diagnosis not present

## 2020-10-20 ENCOUNTER — Ambulatory Visit
Admission: RE | Admit: 2020-10-20 | Discharge: 2020-10-20 | Disposition: A | Discharge status: Home / Self Care | Payer: PPO | Source: Ambulatory Visit | Attending: Radiation Oncology | Admitting: Radiation Oncology

## 2020-10-20 DIAGNOSIS — C61 Malignant neoplasm of prostate: Secondary | ICD-10-CM | POA: Diagnosis not present

## 2020-10-20 DIAGNOSIS — Z510 Encounter for antineoplastic radiation therapy: Secondary | ICD-10-CM | POA: Diagnosis not present

## 2020-10-21 ENCOUNTER — Ambulatory Visit
Admission: RE | Admit: 2020-10-21 | Discharge: 2020-10-21 | Disposition: A | Discharge status: Home / Self Care | Payer: PPO | Source: Ambulatory Visit | Attending: Radiation Oncology | Admitting: Radiation Oncology

## 2020-10-21 ENCOUNTER — Other Ambulatory Visit: Payer: Self-pay

## 2020-10-21 DIAGNOSIS — C61 Malignant neoplasm of prostate: Secondary | ICD-10-CM | POA: Diagnosis not present

## 2020-10-21 DIAGNOSIS — Z510 Encounter for antineoplastic radiation therapy: Secondary | ICD-10-CM | POA: Diagnosis not present

## 2020-10-22 ENCOUNTER — Ambulatory Visit
Admission: RE | Admit: 2020-10-22 | Discharge: 2020-10-22 | Disposition: A | Discharge status: Home / Self Care | Payer: PPO | Source: Ambulatory Visit | Attending: Radiation Oncology | Admitting: Radiation Oncology

## 2020-10-22 ENCOUNTER — Other Ambulatory Visit: Payer: Self-pay

## 2020-10-22 DIAGNOSIS — Z510 Encounter for antineoplastic radiation therapy: Secondary | ICD-10-CM | POA: Diagnosis not present

## 2020-10-22 DIAGNOSIS — C61 Malignant neoplasm of prostate: Secondary | ICD-10-CM | POA: Diagnosis not present

## 2020-10-22 DIAGNOSIS — Z5111 Encounter for antineoplastic chemotherapy: Secondary | ICD-10-CM | POA: Diagnosis not present

## 2020-10-25 ENCOUNTER — Ambulatory Visit
Admission: RE | Admit: 2020-10-25 | Discharge: 2020-10-25 | Disposition: A | Discharge status: Home / Self Care | Payer: PPO | Source: Ambulatory Visit | Attending: Radiation Oncology | Admitting: Radiation Oncology

## 2020-10-25 ENCOUNTER — Other Ambulatory Visit: Payer: Self-pay

## 2020-10-25 DIAGNOSIS — C61 Malignant neoplasm of prostate: Secondary | ICD-10-CM | POA: Diagnosis not present

## 2020-10-25 DIAGNOSIS — Z510 Encounter for antineoplastic radiation therapy: Secondary | ICD-10-CM | POA: Diagnosis not present

## 2020-10-26 ENCOUNTER — Ambulatory Visit
Admission: RE | Admit: 2020-10-26 | Discharge: 2020-10-26 | Disposition: A | Discharge status: Home / Self Care | Payer: PPO | Source: Ambulatory Visit | Attending: Radiation Oncology | Admitting: Radiation Oncology

## 2020-10-26 DIAGNOSIS — Z510 Encounter for antineoplastic radiation therapy: Secondary | ICD-10-CM | POA: Diagnosis not present

## 2020-10-26 DIAGNOSIS — C61 Malignant neoplasm of prostate: Secondary | ICD-10-CM | POA: Diagnosis not present

## 2020-10-27 ENCOUNTER — Other Ambulatory Visit: Payer: Self-pay

## 2020-10-27 ENCOUNTER — Ambulatory Visit
Admission: RE | Admit: 2020-10-27 | Discharge: 2020-10-27 | Disposition: A | Discharge status: Home / Self Care | Payer: PPO | Source: Ambulatory Visit | Attending: Radiation Oncology | Admitting: Radiation Oncology

## 2020-10-27 DIAGNOSIS — Z510 Encounter for antineoplastic radiation therapy: Secondary | ICD-10-CM | POA: Diagnosis not present

## 2020-10-27 DIAGNOSIS — C61 Malignant neoplasm of prostate: Secondary | ICD-10-CM | POA: Diagnosis not present

## 2020-10-28 ENCOUNTER — Ambulatory Visit
Admission: RE | Admit: 2020-10-28 | Discharge: 2020-10-28 | Disposition: A | Discharge status: Home / Self Care | Payer: PPO | Source: Ambulatory Visit | Attending: Radiation Oncology | Admitting: Radiation Oncology

## 2020-10-28 ENCOUNTER — Other Ambulatory Visit: Payer: Self-pay

## 2020-10-28 DIAGNOSIS — Z510 Encounter for antineoplastic radiation therapy: Secondary | ICD-10-CM | POA: Diagnosis not present

## 2020-10-28 DIAGNOSIS — C61 Malignant neoplasm of prostate: Secondary | ICD-10-CM | POA: Diagnosis not present

## 2020-10-29 ENCOUNTER — Other Ambulatory Visit: Payer: Self-pay

## 2020-10-29 ENCOUNTER — Ambulatory Visit
Admission: RE | Admit: 2020-10-29 | Discharge: 2020-10-29 | Disposition: A | Discharge status: Home / Self Care | Payer: PPO | Source: Ambulatory Visit | Attending: Radiation Oncology | Admitting: Radiation Oncology

## 2020-10-29 DIAGNOSIS — Z510 Encounter for antineoplastic radiation therapy: Secondary | ICD-10-CM | POA: Diagnosis not present

## 2020-10-29 DIAGNOSIS — C61 Malignant neoplasm of prostate: Secondary | ICD-10-CM | POA: Diagnosis not present

## 2020-11-01 ENCOUNTER — Ambulatory Visit
Admission: RE | Admit: 2020-11-01 | Discharge: 2020-11-01 | Disposition: A | Discharge status: Home / Self Care | Payer: PPO | Source: Ambulatory Visit | Attending: Radiation Oncology | Admitting: Radiation Oncology

## 2020-11-01 ENCOUNTER — Other Ambulatory Visit: Payer: Self-pay

## 2020-11-01 DIAGNOSIS — C61 Malignant neoplasm of prostate: Secondary | ICD-10-CM | POA: Diagnosis not present

## 2020-11-01 DIAGNOSIS — Z510 Encounter for antineoplastic radiation therapy: Secondary | ICD-10-CM | POA: Diagnosis not present

## 2020-11-02 ENCOUNTER — Other Ambulatory Visit: Payer: Self-pay

## 2020-11-02 ENCOUNTER — Ambulatory Visit: Payer: PPO

## 2020-11-02 ENCOUNTER — Ambulatory Visit
Admission: RE | Admit: 2020-11-02 | Discharge: 2020-11-02 | Disposition: A | Discharge status: Home / Self Care | Payer: PPO | Source: Ambulatory Visit | Attending: Radiation Oncology | Admitting: Radiation Oncology

## 2020-11-02 DIAGNOSIS — C61 Malignant neoplasm of prostate: Secondary | ICD-10-CM | POA: Insufficient documentation

## 2020-11-02 DIAGNOSIS — Z510 Encounter for antineoplastic radiation therapy: Secondary | ICD-10-CM | POA: Diagnosis not present

## 2020-11-03 ENCOUNTER — Ambulatory Visit
Admission: RE | Admit: 2020-11-03 | Discharge: 2020-11-03 | Disposition: A | Discharge status: Home / Self Care | Payer: PPO | Source: Ambulatory Visit | Attending: Radiation Oncology | Admitting: Radiation Oncology

## 2020-11-03 ENCOUNTER — Encounter: Payer: Self-pay | Admitting: Radiation Oncology

## 2020-11-03 ENCOUNTER — Other Ambulatory Visit: Payer: Self-pay

## 2020-11-03 DIAGNOSIS — C61 Malignant neoplasm of prostate: Secondary | ICD-10-CM | POA: Diagnosis not present

## 2020-11-03 DIAGNOSIS — Z510 Encounter for antineoplastic radiation therapy: Secondary | ICD-10-CM | POA: Diagnosis not present

## 2020-11-04 ENCOUNTER — Encounter: Payer: Self-pay | Admitting: Medical Oncology

## 2020-11-05 ENCOUNTER — Telehealth: Payer: Self-pay

## 2020-11-05 NOTE — Progress Notes (Addendum)
Chronic Care Management Pharmacy Note  11/08/2020 Name:  SINCLAIR ALLIGOOD MRN:  [id number] DOB:  13-Dec-1945  Subjective: FARD BORUNDA is an 75 y.o. year old male who is a primary patient of [name], [place].  The CCM team was consulted for assistance with disease management and care coordination needs.    Engaged with patient face to face for initial visit in response to provider referral for pharmacy case management and/or care coordination services.   Consent to Services:  The patient was given the following information about Chronic Care Management services today, agreed to services, and gave verbal consent: 1. CCM service includes personalized support from designated clinical staff supervised by the primary care provider, including individualized plan of care and coordination with other care providers 2. 24/7 contact phone numbers for assistance for urgent and routine care needs. 3. Service will only be billed when office clinical staff spend 20 minutes or more in a month to coordinate care. 4. Only one practitioner may furnish and bill the service in a calendar month. 5.The patient may stop CCM services at any time (effective at the end of the month) by phone call to the office staff. 6. The patient will be responsible for cost sharing (co-pay) of up to 20% of the service fee (after annual deductible is met). Patient agreed to services and consent obtained.  Patient Care Team: [name], [place] as PCP - General (Physician Assistant) [name], MD as Attending Physician (Urology) Thalia Bloodgood, Kimberly as Referring Physician (Optometry) Clydene Laming, Barnabas Harries., MD as Referring Physician (Gastroenterology) Nigel Mormon, MD as Consulting Physician (Cardiology) Lenard Simmer, [facility] (Gastroenterology) Paralee Cancel, MD as Consulting Physician (Orthopedic Surgery) [name], MD as Attending Physician (Urology) Cira Rue, RN Nurse Navigator as Registered Nurse  ([place]) Madelin Rear, [facility] as Pharmacist (Pharmacist)  Recent office visits: 09/07/2020 (PCP): Labs- not in DM range, kidney/cholesterol stable, blood stable consider b12. 06/08/2020 (PCP): Pt is checking sugars at home, morning averaging 135-151, evenings 85-105. Taking metoprolol 25 mg daily and ramipril 5 mg daily - BP at home avg systolic 644-034, diastolic 74-25. Hoping to get vaccinated soon, had covid earlier in the year. Get covid vaccine 2 weeks from today. Lab f/u 3 months.  Recent consult visits: 11/03/2020 (Dr Tammi Klippel): FINAL TREATMENT radiotherapy to the Prostate 06/24/2020 (Dr Virgina Jock): 1 yr f/u. CAD s/p two vessel succefful PCI to mLCx and mLAD, controlled hypertension, type 2 DM, CKD 3, hyperlipidemia with statin intolerance, on PCSK9 inhibitor  Objective:  Lab Results  Component Value Date   CREATININE 1.41 (H) 09/07/2020   BUN 31 (H) 09/07/2020   GFR 47.70 (L) 03/03/2020   GFRNONAA 58 (L) 10/21/2019   GFRAA >60 10/21/2019   NA 139 09/07/2020   K 4.8 09/07/2020   CALCIUM 9.5 09/07/2020   CO2 29 09/07/2020    Lab Results  Component Value Date/Time   HGBA1C 6.1 (H) 09/07/2020 01:42 PM   HGBA1C 5.6 06/08/2020 09:49 AM   HGBA1C 6.7 (H) 03/03/2020 09:04 AM   HGBA1C 5.3 08/21/2018 12:00 AM   GFR 47.70 (L) 03/03/2020 09:04 AM   GFR 49.32 (L) 10/08/2019 08:20 AM    Last diabetic Eye exam: No results found for: HMDIABEYEEXA  Last diabetic Foot exam: No results found for: HMDIABFOOTEX   Lab Results  Component Value Date   CHOL 132 09/07/2020   HDL 45 09/07/2020   LDLCALC 57 09/07/2020   LDLDIRECT 59.0 05/28/2019   TRIG 237 (H) 09/07/2020   CHOLHDL  2.9 09/07/2020    Hepatic Function Latest Ref Rng & Units 09/07/2020 10/08/2019 08/21/2018  Total Protein 6.1 - 8.1 g/dL 6.7 6.6 -  Albumin 3.5 - 5.2 g/dL - 3.9 -  AST 10 - 35 U/L _0 ALT 9 - 46 U/L _1 Alk Phosphatase 39 - 117 U/L - 60 61  Total Bilirubin 0.2 - 1.2 mg/dL 0.5 0.5 -    Lab  Results  Component Value Date/Time   TSH 2.05 08/21/2018 12:00 AM    CBC Latest Ref Rng & Units 09/07/2020 02/06/2020 01/21/2020  WBC 3.8 - 10.8 Thousand/uL 5.6 5.6 6.2  Hemoglobin 13.2 - 17.1 g/dL 12.1(L) 12.8(L) 12.4(L)  Hematocrit 38.5 - 50.0 % 34.6(L) 37.8(L) 37.0(L)  Platelets 140 - 400 Thousand/uL 218 215.0 243.0    No results found for: VD25OH  Clinical ASCVD: Yes  The ASCVD Risk score Mikey Bussing DC Jr., et al., 2013) failed to calculate for the following reasons:   The patient has a prior MI or stroke diagnosis    Depression screen [facility] 2/9 09/07/2020 08/25/2019 05/28/2019  Decreased Interest 0 0 0  Down, Depressed, Hopeless 0 0 0  PHQ - 2 Score 0 0 0   Social History   Tobacco Use  Smoking Status Former Smoker  . Packs/day: 1.00  . Years: 30.00  . Pack years: 30.00  . Types: Cigarettes  . Quit date: 2003  . Years since quitting: 19.1  Smokeless Tobacco Never Used   BP Readings from Last 3 Encounters:  09/07/20 110/60  06/24/20 (!) 108/56  06/08/20 130/70   Pulse Readings from Last 3 Encounters:  09/07/20 (!) 57  06/24/20 (!) 59  06/08/20 (!) 57   Wt Readings from Last 3 Encounters:  09/07/20 201 lb 4 oz (91.3 kg)  06/24/20 199 lb (90.3 kg)  06/08/20 197 lb (89.4 kg)   Assessment/Interventions: Review of patient past medical history, allergies, medications, health status, including review of consultants reports, laboratory and other test data, was performed as part of comprehensive evaluation and provision of chronic care management services.   SDOH:  (Social Determinants of Health) assessments and interventions performed:   CCM Care Plan  Allergies  Allergen Reactions  . Crestor [Rosuvastatin Calcium] Anaphylaxis and Swelling    Tongue swells/throat closes  . Statins Other (See Comments)    Muscle/joint pain with ALL other statin drug but Crestor is severe   Medications Reviewed Today    Reviewed by Madelin Rear, [facility] (Pharmacist) on 11/08/20 at 1018  Med  List Status: <None>  Medication Order Taking? Sig Documenting Provider Last Dose Status Informant  alfuzosin (UROXATRAL) 10 MG 24 hr tablet [id number]  Take 10 mg by mouth daily with supper. [provider]  Active Self  allopurinol (ZYLOPRIM) 300 MG tablet [id number]  TAKE 1 TABLET (300 MG TOTAL) BY MOUTH DAILY WITH LUNCH. Morene Rankins, [place], [place]  Active   amoxicillin (AMOXIL) 500 MG capsule [id number]  TAKE 4 CAPSULES BY MOUTH 1 HOUR PRIOR TO DENTAL WORK  Patient not taking: Reported on 09/07/2020   [provider]  Active   ASPIRIN 81 81 MG chewable tablet [id number]  CHEW 1 TABLET(S) TWICE A DAY BY ORAL ROUTE FOR 4 WEEKS FOR 30 DAYS. [provider]  Active   Cyanocobalamin (B-12) 2500 MCG TABS [id number] Yes Take 1 tablet by mouth. [provider] Taking Active   metoprolol succinate (TOPROL-XL) 25 MG 24 hr tablet [id number]  TAKE 1 TABLET BY MOUTH EVERY DAY Ganji,  Ulice Dash, MD  Active   nitroGLYCERIN (NITROSTAT) 0.4 MG SL tablet [id number]  Place 1 tablet (0.4 mg total) under the tongue every 5 (five) minutes x 3 doses as needed for chest pain. Patwardhan, Reynold Bowen, MD  Active   Potassium Citrate 15 MEQ (1620 MG) TBCR [id number]  Take 1 tablet by mouth 2 (two) times daily. [provider]  Active   ranolazine (RANEXA) 500 MG 12 hr tablet [id number]  TAKE 2 TABLETS BY MOUTH TWICE A DAY Adrian Prows, MD  Active   REPATHA SURECLICK 732 MG/ML Darden Palmer [id number]  INJECT 1ML EVERY 2 WEEKS Patwardhan, Reynold Bowen, MD  Active          Patient Active Problem List   Diagnosis Date Noted  . Chronic kidney disease, stage 3b ([place]) 07/19/2020  . Malignant neoplasm of prostate ([place]) 06/11/2020  . S/P right TKA 10/23/2019  . COVID-19 09/07/2019  . Statin intolerance 11/21/2018  . 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 History of kidney stones 02/26/2018  . Osteoarthritis of right knee 02/26/2018  . Post PTCA 05/24/2017  . Myocardial infarction ([place]) 04/2017   . Polyp of colon, adenomatous 12/08/2016  . Arthritis of carpometacarpal [facility]) joint of left thumb 11/26/2016  . Essential hypertension 05/12/2015  . 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 Hypertrophic and atrophic condition of skin 05/12/2015  . Hyperlipidemia 05/12/2015  . Other seborrheic keratosis 05/12/2015  . Type 2 diabetes mellitus without complication, without long-term current use of insulin ([place]) 05/12/2015  . Hypertrophy of prostate without urinary obstruction and other lower urinary tract symptoms (LUTS) 04/08/2013    Immunization History  Administered Date(s) Administered  . Fluad Quad(high Dose 65+) 05/28/2019, 06/08/2020  . Influenza, High Dose Seasonal PF 07/10/2016, 06/27/2017, 06/11/2018  . Influenza-Unspecified 07/05/2010, 07/17/2011, 07/08/2012, 06/24/2013, 07/21/2014, 06/28/2015, 07/10/2016, 06/27/2017  . Pneumococcal Conjugate-13 11/09/2016  . Pneumococcal Polysaccharide-23 10/07/2012  . Tdap 02/22/2018  . Zoster 05/16/2013   Conditions to be addressed/monitored:  CAD, HTN, HLD and DMII  Care Plan : [facility] AMB "PATIENT-SPECIFIC PROBLEM"  Updates made by Madelin Rear, [facility] since 11/08/2020 12:00 AM    Problem: CAD, HTN, HLD and DMII   Priority: High  Note:   Pharmacist Clinical Goal(s):  Marland Kitchen Over the next 365 days, patient will verbalize ability to afford treatment regimen through collaboration with PharmD and provider.   Interventions: . 1:1 collaboration with [name], PA regarding development and update of comprehensive plan of care as evidenced by provider attestation and co-signature . Inter-disciplinary care team collaboration (see longitudinal plan of care) . Comprehensive medication review performed; medication list updated in electronic medical record  Hypertension (BP goal <130/80) -controlled -Current treatment: . Metoprolol succinate 25 mg once daily  . Ranolazine 500 mg every 12 hours  -Medications previously tried: ramipril 5 mg once daily   -Current home readings: 100-110-60s  -Current dietary habits: low sodium -Current exercise habits: [employer] deep water aerobics multiple times per week.  -Denies hypotensive/hypertensive symptoms -Educated on BP goals and benefits of medications for prevention of heart attack, stroke and kidney damage; Daily salt intake goal < 2300 mg; Exercise goal of 150 minutes per week; Importance of home blood pressure monitoring; -Counseled to monitor BP at home at least once every 1-2 weeks, document, and provide log at future appointments -Counseled on diet and exercise extensively Recommended to continue current medication Counseled on signs of low blood pressure  Hyperlipidemia: (LDL goal < 70) -controlled -Current treatment: . Repatha 140 mg injection every 2 weeks -  Medications previously tried: statin intolerant/allergy  -Current dietary patterns: see diabetes -Current exercise habits: see diabetes -Educated on Cholesterol goals;  -Counseled on diet and exercise extensively Recommended to continue current medication  Diabetes (A1c goal <7%) -controlled -Current medications: . n/a -Medications previously tried: metformin  -Current home glucose readings . fasting glucose: n/a . post prandial glucose: n/a -Denies hypoglycemic/hyperglycemic symptoms -Current meal patterns:  . breakfast: cereal, maybe egg, small piece of bacon, oatmeal, pancakes  . lunch: toss salad  . dinner: minimal sandwiches. Fish, tuna. Green vegetables, raw fruits. . snacks: dry roasted peanuts, minimal chips  . drinks: no coffee, apple cider or orange juice -Current exercise: [employer] deep water aerobics three times -Educated onA1c and blood sugar goals; Complications of diabetes including kidney damage, retinal damage, and cardiovascular disease; Exercise goal of 150 minutes per week; -Counseled to check feet daily and get yearly eye exams -Counseled on diet and exercise extensively  Patient Goals/Self-Care  Activities . Over the next 365 days, patient will:  - take medications as prescribed check blood pressure once every 1-2 weeks, document, and provide at future appointments  Follow Up Plan: Telephone follow up appointment with care management team member scheduled for: 05/2021   Long-Range Goal: Patient Stated   Start Date: 11/08/2020  Expected End Date: 11/08/2021  This Visit's Progress: On track  Note:   Pharmacist Clinical Goal(s):  Marland Kitchen Over the next 365 days, patient will verbalize ability to afford treatment regimen through collaboration with PharmD and provider.   Interventions: . 1:1 collaboration with [name], PA regarding development and update of comprehensive plan of care as evidenced by provider attestation and co-signature . Inter-disciplinary care team collaboration (see longitudinal plan of care) . Comprehensive medication review performed; medication list updated in electronic medical record  Hypertension (BP goal <130/80) -controlled -Current treatment: . Metoprolol succinate 25 mg once daily  . Ranolazine 500 mg every 12 hours  -Medications previously tried: ramipril 5 mg once daily  -Current home readings: 100-110-60s  -Current dietary habits: low sodium -Current exercise habits: [employer] deep water aerobics multiple times per week.  -Denies hypotensive/hypertensive symptoms -Educated on BP goals and benefits of medications for prevention of heart attack, stroke and kidney damage; Daily salt intake goal < 2300 mg; Exercise goal of 150 minutes per week; Importance of home blood pressure monitoring; -Counseled to monitor BP at home at least once every 1-2 weeks, document, and provide log at future appointments -Counseled on diet and exercise extensively Recommended to continue current medication Counseled on signs of low blood pressure  Hyperlipidemia: (LDL goal < 70) -controlled -Current treatment: . Repatha 140 mg injection every 2 weeks -Medications  previously tried: statin intolerant/allergy  -Current dietary patterns: see diabetes -Current exercise habits: see diabetes -Educated on Cholesterol goals;  -Counseled on diet and exercise extensively Recommended to continue current medication  Diabetes (A1c goal <7%) -controlled -Current medications: . n/a -Medications previously tried: metformin  -Current home glucose readings . fasting glucose: n/a . post prandial glucose: n/a -Denies hypoglycemic/hyperglycemic symptoms -Current meal patterns:  . breakfast: cereal, maybe egg, small piece of bacon, oatmeal, pancakes  . lunch: toss salad  . dinner: minimal sandwiches. Fish, tuna. Green vegetables, raw fruits. . snacks: dry roasted peanuts, minimal chips  . drinks: no coffee, apple cider or orange juice -Current exercise: [employer] deep water aerob[employer] three times -Educated onA1c and blood sugar goals; Complications of diabetes including kidney damage, retinal damage, and cardiovascular disease; Exercise goal of 150 minutes per week; -Counseled to  check feet daily and get yearly eye exams -Counseled on diet and exercise extensively  Patient Goals/Self-Care Activities . Over the next 365 days, patient will:  - take medications as prescribed check blood pressure once every 1-2 weeks, document, and provide at future appointments  Follow Up Plan: Telephone follow up appointment with care management team member scheduled for: 05/2021     Medication Assistance: Repatha and Ranexa  medication assistance program. in process.  Anticipated assistance start date 12/06/2020 if approved.  See plan of care for additional detail.  Patient's preferred pharmacy is:  [id number] [place], [place] - [address] [place] [id number]: [phone]: [phone] Uses pill box? Yes Pt endorses 100% compliance  We discussed: Benefits of medication synchronization,  packaging and delivery as well as enhanced pharmacist oversight with [facility]. Patient decided to: Continue current medication management strategy  Follow Up:  Patient agrees to Care Plan and Follow-up.  Plan: Telephone follow up appointment with care management team member scheduled for:  05/2021  CPA follow-up: PAP application for Repatha and Ranexa if available. 1 month cost review - [facility] vs [facility] (exclude PAP meds if approved)  Future Appointments  Date Time Provider DChappell 12/01/2020 10:00 AM Bruning, Ashlyn, PA-C [facility] None  12/06/2020  9:30 AM WInda Coke PA [facility] PEC  05/10/2021  1:30 PM [facility] CCM PHARMACIST [facility]  06/24/2021 10:30 AM Patwardhan, MReynold Bowen MD [facility] None   JMadelin Rear Pharm.D., BCGP Clinical Pharmacist [place]([phone] I have personally reviewed this encounter including the documentation in this note and have collaborated with the care management provider regarding care management and care coordination activities to include development and update of the comprehensive care plan. I am certifying that I agree with the content of this note and encounter as supervising provider.  SInda CokePA-C

## 2020-11-05 NOTE — Chronic Care Management (AMB) (Signed)
Chronic Care Management Pharmacy Assistant   Name: Nicholas Maxwell  MRN: [id number] DOB: 1946/06/23  Reason for Encounter: Medication Review/ Initial Visit   Nicholas Maxwell,  75 y.o. , male presents for their Initial CCM visit with the clinical pharmacist In office.  PCP : [name], PA  Allergies:   Allergies  Allergen Reactions  . Crestor [Rosuvastatin Calcium] Anaphylaxis and Swelling    Tongue swells/throat closes  . Statins Other (See Comments)    Muscle/joint pain with ALL other statin drug but Crestor is severe    Medications: Outpatient Encounter Medications as of 11/05/2020  Medication Sig  . alfuzosin (UROXATRAL) 10 MG 24 hr tablet Take 10 mg by mouth daily with supper.  Marland Kitchen allopurinol (ZYLOPRIM) 300 MG tablet TAKE 1 TABLET (300 MG TOTAL) BY MOUTH DAILY WITH LUNCH.  Marland Kitchen amoxicillin (AMOXIL) 500 MG capsule TAKE 4 CAPSULES BY MOUTH 1 HOUR PRIOR TO DENTAL WORK (Patient not taking: Reported on 09/07/2020)  . ASPIRIN 81 81 MG chewable tablet CHEW 1 TABLET(S) TWICE A DAY BY ORAL ROUTE FOR 4 WEEKS FOR 30 DAYS.  . cephALEXin (KEFLEX) 500 MG capsule Take 500 mg by mouth 3 (three) times daily.  . metoprolol succinate (TOPROL-XL) 25 MG 24 hr tablet TAKE 1 TABLET BY MOUTH EVERY DAY  . nitroGLYCERIN (NITROSTAT) 0.4 MG SL tablet Place 1 tablet (0.4 mg total) under the tongue every 5 (five) minutes x 3 doses as needed for chest pain.  Marland Kitchen Potassium Citrate 15 MEQ (1620 MG) TBCR Take 1 tablet by mouth 2 (two) times daily.  . ranolazine (RANEXA) 500 MG 12 hr tablet TAKE 2 TABLETS BY MOUTH TWICE A DAY  . REPATHA SURECLICK 099 MG/ML SOAJ INJECT 1ML EVERY 2 WEEKS   No facility-administered encounter medications on file as of 11/05/2020.    Current Diagnosis: Patient Active Problem List   Diagnosis Date Noted  . Chronic kidney disease, stage 3b ([place]) 07/19/2020  . Malignant neoplasm of prostate ([place]) 06/11/2020  . S/P right TKA 10/23/2019  . COVID-19 09/07/2019  . Statin  intolerance 11/21/2018  . 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 History of kidney stones 02/26/2018  . Osteoarthritis of right knee 02/26/2018  . Post PTCA 05/24/2017  . Myocardial infarction ([place]) 04/2017  . Polyp of colon, adenomatous 12/08/2016  . Arthritis of carpometacarpal [facility]) joint of left thumb 11/26/2016  . Essential hypertension 05/12/2015  . 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 Hypertrophic and atrophic condition of skin 05/12/2015  . Hyperlipidemia 05/12/2015  . Other seborrheic keratosis 05/12/2015  . Type 2 diabetes mellitus without complication, without long-term current use of insulin ([place]) 05/12/2015  . Hypertrophy of prostate without urinary obstruction and other lower urinary tract symptoms (LUTS) 04/08/2013    Have you seen any other providers since your last visit?   09/14/2020 OV Rad Onc Tyler Pita, MD Malignant neoplasms of Prostate, 09/22/2020 Patient started IMRT Treatment for Malignant Neoplasm of Prostate with Rad Onc Tyler Pita, MD  Any changes in your medications or health?  Patient states he hasn't had any changes in his health. Patient reports taking new medications Vitamin B-12 and calcium.  Any side effects from any medications?   Patient states he does not currently have any side effects from any of his medications.  Do you have any symptoms or problems not managed by your medications?  Patient states he does not have any symptoms or problems that are not managed by his medications at this time.  Any concerns about your health right now?  Patient states he does not have any concerns about his health at this time.  Has your provider asked that you check blood pressure, blood sugar, or follow special diet at home?  Patient states he does not check his blood pressure or blood sugars at home nor has he been asked by his provider to do so. Patient states he does follow a  low salt/low sodium diet. Patient also states he  cuts back on sugar on his sugar intake.  Do you get any type of exercise on a regular basis?  Patient states he is currently a member at [employer]. Patient states he does deep water aerobics every Tuesday and Thursday.  Can you think of a goal you would like to reach for your health?  Patient states he would like to lose some weight as a goal for his health.  Do you have any problems getting your medications?  Patient states he does not have any problems with getting his medications.  Is there anything that you would like to discuss during the appointment?  Patient states he would like to discuss his medications during the appointment.  Please bring medications and supplements to appointment.  April D Calhoun, [place] Pharmacist Assistant [phone]   Follow-Up:  Pharmacist Review

## 2020-11-08 ENCOUNTER — Ambulatory Visit (INDEPENDENT_AMBULATORY_CARE_PROVIDER_SITE_OTHER): Payer: PPO

## 2020-11-08 ENCOUNTER — Other Ambulatory Visit: Payer: Self-pay

## 2020-11-08 DIAGNOSIS — I10 Essential (primary) hypertension: Secondary | ICD-10-CM

## 2020-11-08 DIAGNOSIS — E782 Mixed hyperlipidemia: Secondary | ICD-10-CM

## 2020-11-08 DIAGNOSIS — Z789 Other specified health status: Secondary | ICD-10-CM

## 2020-11-08 DIAGNOSIS — E119 Type 2 diabetes mellitus without complications: Secondary | ICD-10-CM | POA: Diagnosis not present

## 2020-11-08 NOTE — Patient Instructions (Addendum)
Nicholas Maxwell,  Thank you for taking the time to review your medications with me today.  I have included our care plan/goals in the following pages. Please review and call me at [phone] with any questions!  Thanks! Ellin Mayhew, Pharm.D., BCGP Clinical Pharmacist [facility] [phone]  Goals Addressed            This Visit's Progress   . Lifestyle Change-Hypertension   On track    Timeframe:  Long-Range Goal Priority:  Medium Start Date: 11/08/2020                            Expected End Date: 11/08/2021  Follow Up Date 05/2021    - agree to work together to make changes - ask questions to understand    Why is this important?    The changes that you are asked to make may be hard to do.   This is especially true when the changes are life-long.   Knowing why it is important to you is the first step.   Working on the change with your family or support person helps you not feel alone.   Reward yourself and family or support person when goals are met. This can be an activity you choose like bowling, hiking, biking, swimming or shooting hoops.         Patient Care Plan: [facility] AMB "PATIENT-SPECIFIC PROBLEM"    Problem Identified: CAD, HTN, HLD and DMII   Priority: High  Note:   Pharmacist Clinical Goal(s):  Marland Kitchen Over the next 365 days, patient will verbalize ability to afford treatment regimen through collaboration with PharmD and provider.   Interventions: . 1:1 collaboration with [name], PA regarding development and update of comprehensive plan of care as evidenced by provider attestation and co-signature . Inter-disciplinary care team collaboration (see longitudinal plan of care) . Comprehensive medication review performed; medication list updated in electronic medical record  Hypertension (BP goal <130/80) -controlled -Current treatment: . Metoprolol succinate 25 mg once daily  . Ranolazine 500 mg every 12 hours   -Medications previously tried: ramipril 5 mg once daily  -Current home readings: 100-110-60s  -Current dietary habits: low sodium -Current exercise habits: [employer] deep water aerobics multiple times per week.  -Denies hypotensive/hypertensive symptoms -Educated on BP goals and benefits of medications for prevention of heart attack, stroke and kidney damage; Daily salt intake goal < 2300 mg; Exercise goal of 150 minutes per week; Importance of home blood pressure monitoring; -Counseled to monitor BP at home at least once every 1-2 weeks, document, and provide log at future appointments -Counseled on diet and exercise extensively Recommended to continue current medication Counseled on signs of low blood pressure  Hyperlipidemia: (LDL goal < 70) -controlled -Current treatment: . Repatha 140 mg injection every 2 weeks -Medications previously tried: statin intolerant/allergy  -Current dietary patterns: see diabetes -Current exercise habits: see diabetes -Educated on Cholesterol goals;  -Counseled on diet and exercise extensively Recommended to continue current medication  Diabetes (A1c goal <7%) -controlled -Current medications: . n/a -Medications previously tried: metformin  -Current home glucose readings . fasting glucose: n/a . post prandial glucose: n/a -Denies hypoglycemic/hyperglycemic symptoms -Current meal patterns:  . breakfast: cereal, maybe egg, small piece of bacon, oatmeal, pancakes  . lunch: toss salad  . dinner: minimal sandwiches. Fish, tuna. Green vegetables, raw fruits. . snacks: dry roasted peanuts, minimal chips  .  drinks: no coffee, apple cider or orange juice -Current exercise: [employer] deep water aerobics three times -Educated onA1c and blood sugar goals; Complications of diabetes including kidney damage, retinal damage, and cardiovascular disease; Exercise goal of 150 minutes per week; -Counseled to check feet daily and get yearly eye exams -Counseled on  diet and exercise extensively  Patient Goals/Self-Care Activities . Over the next 365 days, patient will:  - take medications as prescribed check blood pressure once every 1-2 weeks, document, and provide at future appointments  Follow Up Plan: Telephone follow up appointment with care management team member scheduled for: 05/2021   Long-Range Goal: Patient Stated   Start Date: 11/08/2020  Expected End Date: 11/08/2021  This Visit's Progress: On track  Note:   Pharmacist Clinical Goal(s):  Marland Kitchen Over the next 365 days, patient will verbalize ability to afford treatment regimen through collaboration with PharmD and provider.   Interventions: . 1:1 collaboration with [name], PA regarding development and update of comprehensive plan of care as evidenced by provider attestation and co-signature . Inter-disciplinary care team collaboration (see longitudinal plan of care) . Comprehensive medication review performed; medication list updated in electronic medical record  Hypertension (BP goal <130/80) -controlled -Current treatment: . Metoprolol succinate 25 mg once daily  . Ranolazine 500 mg every 12 hours  -Medications previously tried: ramipril 5 mg once daily  -Current home readings: 100-110-60s  -Current dietary habits: low sodium -Current exercise habits: [employer] deep water aerobics multiple times per week.  -Denies hypotensive/hypertensive symptoms -Educated on BP goals and benefits of medications for prevention of heart attack, stroke and kidney damage; Daily salt intake goal < 2300 mg; Exercise goal of 150 minutes per week; Importance of home blood pressure monitoring; -Counseled to monitor BP at home at least once every 1-2 weeks, document, and provide log at future appointments -Counseled on diet and exercise extensively Recommended to continue current medication Counseled on signs of low blood pressure  Hyperlipidemia: (LDL goal < 70) -controlled -Current  treatment: . Repatha 140 mg injection every 2 weeks -Medications previously tried: statin intolerant/allergy  -Current dietary patterns: see diabetes -Current exercise habits: see diabetes -Educated on Cholesterol goals;  -Counseled on diet and exercise extensively Recommended to continue current medication  Diabetes (A1c goal <7%) -controlled -Current medications: . n/a -Medications previously tried: metformin  -Current home glucose readings . fasting glucose: n/a . post prandial glucose: n/a -Denies hypoglycemic/hyperglycemic symptoms -Current meal patterns:  . breakfast: cereal, maybe egg, small piece of bacon, oatmeal, pancakes  . lunch: toss salad  . dinner: minimal sandwiches. Fish, tuna. Green vegetables, raw fruits. . snacks: dry roasted peanuts, minimal chips  . drinks: no coffee, apple cider or orange juice -Current exercise: [employer] deep water aerobics three times -Educated onA1c and blood sugar goals; Complications of diabetes including kidney damage, retinal damage, and cardiovascular disease; Exercise goal of 150 minutes per week; -Counseled to check feet daily and get yearly eye exams -Counseled on diet and exercise extensively  Patient Goals/Self-Care Activities . Over the next 365 days, patient will:  - take medications as prescribed check blood pressure once every 1-2 weeks, document, and provide at future appointments  Follow Up Plan: Telephone follow up appointment with care management team member scheduled for: 05/2021     The patient verbalized understanding of instructions provided today and agreed to receive a MyChart copy of patient instruction and/or educational materials. Telephone follow up appointment with pharmacy team member scheduled for: See next appointment with "Care Management Staff"  under "What's Next" below.   Hypertension, Adult High blood pressure (hypertension) is when the force of blood pumping through the arteries is too strong. The  arteries are the blood vessels that carry blood from the heart throughout the body. Hypertension forces the heart to work harder to pump blood and may cause arteries to become narrow or stiff. Untreated or uncontrolled hypertension can cause a heart attack, heart failure, a stroke, kidney disease, and other problems. A blood pressure reading consists of a higher number over a lower number. Ideally, your blood pressure should be below 120/80. The first ("top") number is called the systolic pressure. It is a measure of the pressure in your arteries as your heart beats. The second ("bottom") number is called the diastolic pressure. It is a measure of the pressure in your arteries as the heart relaxes. What are the causes? The exact cause of this condition is not known. There are some conditions that result in or are related to high blood pressure. What increases the risk? Some risk factors for high blood pressure are under your control. The following factors may make you more likely to develop this condition:  Smoking.  Having type 2 diabetes mellitus, high cholesterol, or both.  Not getting enough exercise or physical activity.  Being overweight.  Having too much fat, sugar, calories, or salt (sodium) in your diet.  Drinking too much alcohol. Some risk factors for high blood pressure may be difficult or impossible to change. Some of these factors include:  Having chronic kidney disease.  Having a family history of high blood pressure.  Age. Risk increases with age.  Race. You may be at higher risk if you are African American.  Gender. Men are at higher risk than women before age 50. After age 64, women are at higher risk than men.  Having obstructive sleep apnea.  Stress. What are the signs or symptoms? High blood pressure may not cause symptoms. Very high blood pressure (hypertensive crisis) may cause:  Headache.  Anxiety.  Shortness of breath.  Nosebleed.  Nausea and  vomiting.  Vision changes.  Severe chest pain.  Seizures. How is this diagnosed? This condition is diagnosed by measuring your blood pressure while you are seated, with your arm resting on a flat surface, your legs uncrossed, and your feet flat on the floor. The cuff of the blood pressure monitor will be placed directly against the skin of your upper arm at the level of your heart. It should be measured at least twice using the same arm. Certain conditions can cause a difference in blood pressure between your right and left arms. Certain factors can cause blood pressure readings to be lower or higher than normal for a short period of time:  When your blood pressure is higher when you are in a health care provider's office than when you are at home, this is called white coat hypertension. Most people with this condition do not need medicines.  When your blood pressure is higher at home than when you are in a health care provider's office, this is called masked hypertension. Most people with this condition may need medicines to control blood pressure. If you have a high blood pressure reading during one visit or you have normal blood pressure with other risk factors, you may be asked to:  Return on a different day to have your blood pressure checked again.  Monitor your blood pressure at home for 1 week or longer. If you are diagnosed with  hypertension, you may have other blood or imaging tests to help your health care provider understand your overall risk for other conditions. How is this treated? This condition is treated by making healthy lifestyle changes, such as eating healthy foods, exercising more, and reducing your alcohol intake. Your health care provider may prescribe medicine if lifestyle changes are not enough to get your blood pressure under control, and if:  Your systolic blood pressure is above 130.  Your diastolic blood pressure is above 80. Your personal target blood  pressure may vary depending on your medical conditions, your age, and other factors. Follow these instructions at home: Eating and drinking  Eat a diet that is high in fiber and potassium, and low in sodium, added sugar, and fat. An example eating plan is called the DASH (Dietary Approaches to Stop Hypertension) diet. To eat this way: ? Eat plenty of fresh fruits and vegetables. Try to fill one half of your plate at each meal with fruits and vegetables. ? Eat whole grains, such as whole-wheat pasta, brown rice, or whole-grain bread. Fill about one fourth of your plate with whole grains. ? Eat or drink low-fat dairy products, such as skim milk or low-fat yogurt. ? Avoid fatty cuts of meat, processed or cured meats, and poultry with skin. Fill about one fourth of your plate with lean proteins, such as fish, chicken without skin, beans, eggs, or tofu. ? Avoid pre-made and processed foods. These tend to be higher in sodium, added sugar, and fat.  Reduce your daily sodium intake. Most people with hypertension should eat less than 1,500 mg of sodium a day.  Do not drink alcohol if: ? Your health care provider tells you not to drink. ? You are pregnant, may be pregnant, or are planning to become pregnant.  If you drink alcohol: ? Limit how much you use to:  0-1 drink a day for women.  0-2 drinks a day for men. ? Be aware of how much alcohol is in your drink. In the [place] one drink equals one 12 oz bottle of beer (355 mL), one 5 oz glass of wine (148 mL), or one 1 oz glass of hard liquor (44 mL).   Lifestyle  Work with your health care provider to maintain a healthy body weight or to lose weight. Ask what an ideal weight is for you.  Get at least 30 minutes of exercise most days of the week. Activities may include walking, swimming, or biking.  Include exercise to strengthen your muscles (resistance exercise), such as Pilates or lifting weights, as part of your weekly exercise routine. Try to  do these types of exercises for 30 minutes at least 3 days a week.  Do not use any products that contain nicotine or tobacco, such as cigarettes, e-cigarettes, and chewing tobacco. If you need help quitting, ask your health care provider.  Monitor your blood pressure at home as told by your health care provider.  Keep all follow-up visits as told by your health care provider. This is important.   Medicines  Take over-the-counter and prescription medicines only as told by your health care provider. Follow directions carefully. Blood pressure medicines must be taken as prescribed.  Do not skip doses of blood pressure medicine. Doing this puts you at risk for problems and can make the medicine less effective.  Ask your health care provider about side effects or reactions to medicines that you should watch for. Contact a health care provider if you:  Think  you are having a reaction to a medicine you are taking.  Have headaches that keep coming back (recurring).  Feel dizzy.  Have swelling in your ankles.  Have trouble with your vision. Get help right away if you:  Develop a severe headache or confusion.  Have unusual weakness or numbness.  Feel faint.  Have severe pain in your chest or abdomen.  Vomit repeatedly.  Have trouble breathing. Summary  Hypertension is when the force of blood pumping through your arteries is too strong. If this condition is not controlled, it may put you at risk for serious complications.  Your personal target blood pressure may vary depending on your medical conditions, your age, and other factors. For most people, a normal blood pressure is less than 120/80.  Hypertension is treated with lifestyle changes, medicines, or a combination of both. Lifestyle changes include losing weight, eating a healthy, low-sodium diet, exercising more, and limiting alcohol. This information is not intended to replace advice given to you by your health care provider.  Make sure you discuss any questions you have with your health care provider. Document Revised: 05/29/2018 Document Reviewed: 05/29/2018 Elsevier Patient Education  2021 [employer].

## 2020-11-09 ENCOUNTER — Telehealth: Payer: Self-pay

## 2020-11-09 NOTE — Chronic Care Management (AMB) (Signed)
° ° °  Chronic Care Management Pharmacy Assistant   Name: TALBOT MONARCH  MRN: [id number] DOB: Sep 22, 1946  Patient assistance applications completed for medications Ranexa and Repatha.  Ranexa - Rx Outreach Programmer, [employer]  April D Calhoun, [place] Pharmacist Assistant [phone]  Follow-Up:  Patient Assistance Coordination

## 2020-11-15 ENCOUNTER — Ambulatory Visit (INDEPENDENT_AMBULATORY_CARE_PROVIDER_SITE_OTHER): Payer: PPO

## 2020-11-15 ENCOUNTER — Other Ambulatory Visit: Payer: Self-pay

## 2020-11-15 VITALS — BP 120/66 | HR 66 | Temp 97.3°F | Wt 205.8 lb

## 2020-11-15 DIAGNOSIS — Z0000 Encounter for general adult medical examination without abnormal findings: Secondary | ICD-10-CM

## 2020-11-15 NOTE — Progress Notes (Signed)
  Radiation Oncology         [phone]) [phone] ________________________________  Name: Nicholas Maxwell MRN: [id number]  Date: 11/03/2020  DOB: 05-14-1946  End of Treatment Note  Diagnosis:   75 y.o. 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  Image guidance was performed with daily cone beam CT prior to each fraction to align to gold markers in the prostate and assure proper bladder and rectal fill volumes.  Immobilization was achieved with BodyFix custom mold.  Narrative: The patient tolerated radiation treatment relatively well.   The patient experienced some minor urinary irritation and modest fatigue.  No urinary hesitancy, straining, hematuria, or dysuria. He had nocturia x4.  Plan: The patient has completed radiation treatment. He will return to radiation oncology clinic for routine followup in one month. I advised him to call or return sooner if he has any questions or concerns related to his recovery or treatment. ________________________________  Sheral Apley. Tammi Klippel, M.D.

## 2020-11-15 NOTE — Progress Notes (Addendum)
Subjective:   Nicholas Maxwell is a 75 y.o. male who presents for Medicare Annual/Subsequent preventive examination.  Review of Systems     Cardiac Risk Factors include: advanced age (>69mn, >>72women);diabetes mellitus;obesity (BMI >30kg/m2);hypertension;dyslipidemia;male gender     Objective:    Today's Vitals   11/15/20 1347  BP: 120/66  Pulse: 66  Temp: (!) 97.3 F (36.3 C)  SpO2: 99%  Weight: 205 lb 12.8 oz (93.4 kg)   Body mass index is 30.39 kg/m.  Advanced Directives 11/15/2020 06/29/2020 10/21/2019 08/25/2019 08/21/2017 05/24/2017  Does Patient Have a Medical Advance Directive? Yes Yes Yes Yes No Yes  Type of AParamedicof ATowerLiving will Living will Living will;Healthcare Power of [place]illage [place] Does patient want to make changes to medical advance directive? - No - Patient declined - No - Patient declined - No - Patient declined  Copy of [place] Chart? Yes - validated most recent copy scanned in chart (See row information) No - copy requested - No - copy requested - No - copy requested  Would patient like information on creating a medical advance directive? - - - - No - Patient declined -    Current Medications (verified) Outpatient Encounter Medications as of 11/15/2020  Medication Sig  . alfuzosin (UROXATRAL) 10 MG 24 hr tablet Take 10 mg by mouth daily with supper.  .Marland Kitchenallopurinol (ZYLOPRIM) 300 MG tablet TAKE 1 TABLET (300 MG TOTAL) BY MOUTH DAILY WITH LUNCH.  .Marland KitchenASPIRIN 81 81 MG chewable tablet CHEW 1 TABLET(S) TWICE A DAY BY ORAL ROUTE FOR 4 WEEKS FOR 30 DAYS.  . Calcium Carb-Cholecalciferol (CALCIUM 600+D3 PO) Take by mouth.  . Cyanocobalamin (B-12) 2500 MCG TABS Take 1 tablet by mouth.  . metoprolol succinate (TOPROL-XL) 25 MG 24 hr tablet TAKE 1 TABLET BY MOUTH EVERY DAY  . nitroGLYCERIN (NITROSTAT) 0.4 MG SL tablet Place 1 tablet (0.4 mg total) under the  tongue every 5 (five) minutes x 3 doses as needed for chest pain.  .Marland KitchenPotassium Citrate 15 MEQ (1620 MG) TBCR Take 2 tablets by mouth 2 (two) times daily.  . ranolazine (RANEXA) 500 MG 12 hr tablet TAKE 2 TABLETS BY MOUTH TWICE A DAY  . REPATHA SURECLICK 1244MG/ML SOAJ INJECT 1ML EVERY 2 WEEKS  . amoxicillin (AMOXIL) 500 MG capsule TAKE 4 CAPSULES BY MOUTH 1 HOUR PRIOR TO DENTAL WORK (Patient not taking: No sig reported)   No facility-administered encounter medications on file as of 11/15/2020.    Allergies (verified) Crestor [rosuvastatin calcium] and Statins   History: Past Medical History:  Diagnosis Date  . History of kidney stones   . [place] [place]  . [place] 05[place]  . Statin intolerance    Used >2 different statins   Past Surgical History:  Procedure Laterality Date  . COLONOSCOPY     March of 2018   . CORONARY ANGIOPLASTY WITH STENT PLACEMENT  05/24/2017  . CORONARY STENT INTERVENTION N/A 05/24/2017   Procedure: CORONARY STENT INTERVENTION;  Surgeon: PNigel Mormon MD;  [facility];  Service: Cardiovascular;  Laterality: N/A;  MID Circumflex 4.0x22 Onyx MID LAD 2.75x15 Onyx  . CYSTOSCOPY WITH URETEROSCOPY, STONE BASKETRY AND STENT PLACEMENT  ~ 1990  . HERNIA REPAIR    . INTRAVASCULAR PRESSURE WIRE/FFR STUDY N/A 05/24/2017   Procedure: INTRAVASCULAR PRESSURE WIRE/FFR STUDY;  Surgeon: PNigel Mormon MD;  Location:  [facility];  Service: Cardiovascular;  Laterality: N/A;  MID LAD  . KNEE ARTHROSCOPY Right 2016  . LEFT HEART CATH AND CORONARY ANGIOGRAPHY N/A 05/24/2017   Procedure: LEFT HEART CATH AND CORONARY ANGIOGRAPHY;  Surgeon: Nigel Mormon, MD;  Location: [facility];  Service: Cardiovascular;  Laterality: N/A;  . 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 UMBILICAL HERNIA REPAIR  1992  . UPPER GASTROINTESTINAL ENDOSCOPY     Family History  Problem Relation Age of Onset  . Transient ischemic attack Father 35  . Bone cancer Father   . CAD Mother 82       CABG  . Diabetes Mother   . Breast cancer Neg Hx   . Colon cancer Neg Hx   . Pancreatic cancer Neg Hx   . Prostate cancer Neg Hx    Social History   Socioeconomic History  . Marital status: Married    Spouse name: Not on file  . Number of children: 2  . Years of education: Not on file  . Highest education level: Not on file  Occupational History  . Occupation: retired    Comment: retired  Tobacco Use  . Smoking status: Former Smoker    Packs/day: 1.00    Years: 30.00    Pack years: 30.00    Types: Cigarettes    Quit date: 2003    Years since quitting: 19.1  . Smokeless tobacco: Never Used  Vaping Use  . Vaping Use: Never used  Substance and Sexual Activity  . Alcohol use: Not Currently    Comment: has drank since 2014  . Drug use: No  . Sexual activity: Not Currently  Other Topics Concern  . Not on file  Social History Narrative   Married   Son and daughter   Lynnette Caffey   Worked in Research officer, trade union   Social Determinants of Health   Financial Resource Strain: Low Risk   . Difficulty of Paying Living Expenses: Not hard at all  Food Insecurity: No Food Insecurity  . Worried About Charity fundraiser in the Last Year: Never true  . Ran Out of Food in the Last Year: Never true  Transportation Needs: No Transportation Needs  . Lack of Transportation (Medical): No  . Lack of Transportation (Non-Medical): No  Physical Activity: Insufficiently Active  . Days of Exercise per Week: 2 days  . Minutes of Exercise per Session: 60 min  Stress: No Stress Concern Present  . Feeling of Stress : Not at all  Social Connections: Moderately Integrated  . Frequency of Communication with Friends and Family: More than three times a week  . Frequency of Social Gatherings  with Friends and Family: More than three times a week  . Attends Religious Services: More than 4 times per year  . Active Member of Clubs or Organizations: No  . Attends Archivist Meetings: Never  . Marital Status: Married    Tobacco Counseling Counseling given: Not Answered   Clinical Intake:  Pre-visit preparation completed: Yes  Pain : No/denies pain     BMI - recorded: 30.39 Nutritional Status: BMI > 30  Obese Nutritional Risks: None Diabetes: Yes CBG done?: No Did pt. bring in CBG monitor from home?: No  How often do you need to have someone help you when you read instructions, pamphlets, or other  written materials from your doctor or pharmacy?: 1 - Never  Diabetic?Nutrition Risk Assessment:  Has the patient had any N/V/D within the last 2 months?  No  Does the patient have any non-healing wounds?  No  Has the patient had any unintentional weight loss or weight gain?  No   Diabetes:  Is the patient diabetic?  Yes  If diabetic, was a CBG obtained today?  No  Did the patient bring in their glucometer from home?  No  How often do you monitor your CBG's? Once a month .   Financial Strains and Diabetes Management:  Are you having any financial strains with the device, your supplies or your medication? No .  Does the patient want to be seen by Chronic Care Management for management of their diabetes?  No  Would the patient like to be referred to a Nutritionist or for Diabetic Management?  No   Diabetic Exams:  Diabetic Eye Exam: Overdue for diabetic eye exam. Pt has been advised about the importance in completing this exam. Patient advised to call and schedule an eye exam. Diabetic Foot Exam: Overdue, Pt has been advised about the importance in completing this exam. Pt is scheduled for diabetic foot exam on next appt .   Interpreter Needed?: No  Information entered by :: Charlott Rakes, LPN   Activities of Daily Living In your present state of  health, do you have any difficulty performing the following activities: 11/15/2020  Hearing? N  Vision? N  Difficulty concentrating or making decisions? N  Walking or climbing stairs? N  Dressing or bathing? N  Doing errands, shopping? N  Preparing Food and eating ? N  Using the Toilet? N  In the past six months, have you accidently leaked urine? N  Do you have problems with loss of bowel control? N  Managing your Medications? N  Managing your Finances? N  Housekeeping or managing your Housekeeping? N  Some recent data might be hidden    Patient Care Team: [name], [place] as PCP - General (Physician Assistant) [name], MD as Attending Physician (Urology) Thalia Bloodgood, [place] as Referring Physician (Optometry) Newell Coral., MD as Referring Physician (Gastroenterology) Nigel Mormon, MD as Consulting Physician (Cardiology) Lenard Simmer, [facility] (Gastroenterology) Paralee Cancel, MD as Consulting Physician (Orthopedic Surgery) [name], MD as Attending Physician (Urology) Cira Rue, RN Nurse Navigator as Registered Nurse ([place]) Madelin Rear, [facility] as Pharmacist (Pharmacist)  Indicate any recent Medical Services you may have received from other than [facility] providers in the past year (date may be approximate).     Assessment:   This is a routine wellness examination for [place].  Hearing/Vision screen  Hearing Screening   _0  _1  _2  _3  _4  _5  _6  _7  _8   Right ear:           Left ear:           Comments: Pt denies any hearing issues   Vision Screening Comments: Pt follows up with [facility] in [place] for annual eye exams  Dietary issues and exercise activities discussed: Current Exercise Habits: Home exercise routine, Type of exercise: Other - see comments (water aerobics), Time (Minutes): 60, Frequency (Times/Week): 2, Weekly Exercise (Minutes/Week): 120  Goals    . DIET - EAT MORE FRUITS AND  VEGETABLES    . Lifestyle Change-Hypertension     Timeframe:  Long-Range Goal Priority:  Medium Start Date: 11/08/2020  Expected End Date: 11/08/2021  Follow Up Date 05/2021    - agree to work together to make changes - ask questions to understand    Why is this important?    The changes that you are asked to make may be hard to do.   This is especially true when the changes are life-long.   Knowing why it is important to you is the first step.   Working on the change with your family or support person helps you not feel alone.   Reward yourself and family or support person when goals are met. This can be an activity you choose like bowling, hiking, biking, swimming or shooting hoops.       . Patient Stated     Lose 10 lbs       Depression Screen PHQ 2/9 Scores 11/15/2020 09/07/2020 08/25/2019 05/28/2019 12/14/2017 08/31/2017  PHQ - 2 Score 0 0 0 0 0 0    Fall Risk Fall Risk  11/15/2020 09/07/2020 08/27/2019 08/25/2019 08/21/2017  Falls in the past year? 0 0 0 0 No  Comment - - Emmi Telephone Survey: data to providers prior to load - -  Number falls in past yr: 0 0 - - -  Injury with Fall? 0 0 - 0 -  Risk for fall due to : Impaired vision - - - -  Follow up Falls prevention discussed Falls evaluation completed - Falls evaluation completed;Education provided;Falls prevention discussed -    FALL RISK PREVENTION PERTAINING TO THE HOME:  Any stairs in or around the home? Yes  If so, are there any without handrails? No  Home free of loose throw rugs in walkways, pet beds, electrical cords, etc? Yes  Adequate lighting in your home to reduce risk of falls? Yes   ASSISTIVE DEVICES UTILIZED TO PREVENT FALLS:  Life alert? No  Use of a cane, walker or w/c? No  Grab bars in the bathroom? Yes  Shower chair or bench in shower? Yes  Elevated toilet seat or a handicapped toilet? No   TIMED UP AND GO:  Was the test performed? Yes .  Length of time to  ambulate 10 feet: 10 sec.   Gait steady and fast without use of assistive device  Cognitive Function:     6CIT Screen 11/15/2020 08/25/2019  What Year? 0 points 0 points  What month? 0 points 0 points  What time? - 0 points  Count back from 20 0 points 0 points  Months in reverse 0 points 0 points  Repeat phrase 0 points 0 points  Total Score - 0    Immunizations Immunization History  Administered Date(s) Administered  . Fluad Quad(high Dose 65+) 05/28/2019, 06/08/2020  . Influenza, High Dose Seasonal PF 07/10/2016, 06/27/2017, 06/11/2018  . Influenza-Unspecified 07/05/2010, 07/17/2011, 07/08/2012, 06/24/2013, 07/21/2014, 06/28/2015, 07/10/2016, 06/27/2017  . Pneumococcal Conjugate-13 11/09/2016  . Pneumococcal Polysaccharide-23 10/07/2012  . Tdap 02/22/2018  . Zoster 05/16/2013    TDAP status: Up to date  Flu Vaccine status: Up to date  Done 06/08/20 Pneumococcal vaccine status: Up to date  Covid-19 vaccine status: Declined, Education has been provided regarding the importance of this vaccine but patient still declined. Advised may receive this vaccine at local pharmacy or Health Dept.or vaccine clinic. Aware to provide a copy of the vaccination record if obtained from local pharmacy or Health Dept. Verbalized acceptance and understanding.  Qualifies for Shingles Vaccine? Yes   Zostavax completed Yes   Shingrix Completed?: No.    Education has  been provided regarding the importance of this vaccine. Patient has been advised to call insurance company to determine out of pocket expense if they have not yet received this vaccine. Advised may also receive vaccine at local pharmacy or Health Dept. Verbalized acceptance and understanding.  Screening Tests Health Maintenance  Topic Date Due  . OPHTHALMOLOGY EXAM  Never done  . URINE MICROALBUMIN  Never done  . FOOT EXAM  11/21/2019  . COVID-19 Vaccine (1) 12/01/2020 (Originally 02/21/1958)  . HEMOGLOBIN A1C  03/08/2021  .  COLONOSCOPY (Pts 45-32yr Insurance coverage will need to be confirmed)  12/05/2021  . TETANUS/TDAP  02/23/2028  . INFLUENZA VACCINE  Completed  . Hepatitis C Screening  Completed  . PNA vac Low Risk Adult  Completed    Health Maintenance  Health Maintenance Due  Topic Date Due  . OPHTHALMOLOGY EXAM  Never done  . URINE MICROALBUMIN  Never done  . FOOT EXAM  11/21/2019    Colorectal cancer screening: Type of screening: Colonoscopy. Completed 12/05/16. Repeat every 5 years   Additional Screening:  Hepatitis C Screening:  Completed 08/21/18  Vision Screening: Recommended annual ophthalmology exams for early detection of glaucoma and other disorders of the eye. Is the patient up to date with their annual eye exam?  Yes  Who is the provider or what is the name of the office in which the patient attends annual eye exams? [facility] If pt is not established with a provider, would they like to be referred to a provider to establish care? No .   Dental Screening: Recommended annual dental exams for proper oral hygiene  Community Resource Referral / Chronic Care Management: CRR required this visit?  No   CCM required this visit?  No      Plan:     I have personally reviewed and noted the following in the patient's chart:   . Medical and social history . Use of alcohol, tobacco or illicit drugs  . Current medications and supplements . Functional ability and status . Nutritional status . Physical activity . Advanced directives . List of other physicians . Hospitalizations, surgeries, and ER visits in previous 12 months . Vitals . Screenings to include cognitive, depression, and falls . Referrals and appointments  In addition, I have reviewed and discussed with patient certain preventive protocols, quality metrics, and best practice recommendations. A written personalized care plan for preventive services as well as general preventive health recommendations were provided to  patient.     TWillette Brace LPN   [birth year]24  Nurse Notes: None   I have reviewed documentation for AWV and Advance Care planning provided by [facility], I agree with documentation, I was immediately available for any questions. SInda Coke [place]

## 2020-11-15 NOTE — Patient Instructions (Signed)
[name] , Thank you for taking time to come for your Medicare Wellness Visit. I appreciate your ongoing commitment to your health goals. Please review the following plan we discussed and let me know if I can assist you in the future.   Screening recommendations/referrals: Colonoscopy: Done 12/05/16 Recommended yearly ophthalmology/optometry visit for glaucoma screening and checkup Recommended yearly dental visit for hygiene and checkup  Vaccinations: Influenza vaccine: Done 06/08/20 Up to date Pneumococcal vaccine: Up to date Tdap vaccine: Up to date Shingles vaccine: Shingrix discussed. Please contact your pharmacy for coverage information.    Covid-19: Declined at this time   Advanced directives: Copies in chart  Conditions/risks identified: Lose 10 lbs  Next appointment: Follow up in one year for your annual wellness visit.   Preventive Care 74 Years and Older, Male Preventive care refers to lifestyle choices and visits with your health care provider that can promote health and wellness. What does preventive care include?  A yearly physical exam. This is also called an annual well check.  Dental exams once or twice a year.  Routine eye exams. Ask your health care provider how often you should have your eyes checked.  Personal lifestyle choices, including:  Daily care of your teeth and gums.  Regular physical activity.  Eating a healthy diet.  Avoiding tobacco and drug use.  Limiting alcohol use.  Practicing safe sex.  Taking low doses of aspirin every day.  Taking vitamin and mineral supplements as recommended by your health care provider. What happens during an annual well check? The services and screenings done by your health care provider during your annual well check will depend on your age, overall health, lifestyle risk factors, and family history of disease. Counseling  Your health care provider may ask you questions about your:  Alcohol use.  Tobacco  use.  Drug use.  Emotional well-being.  Home and relationship well-being.  Sexual activity.  Eating habits.  History of falls.  Memory and ability to understand (cognition).  Work and work Statistician. Screening  You may have the following tests or measurements:  Height, weight, and BMI.  Blood pressure.  Lipid and cholesterol levels. These may be checked every 5 years, or more frequently if you are over 81 years old.  Skin check.  Lung cancer screening. You may have this screening every year starting at age 24 if you have a 30-pack-year history of smoking and currently smoke or have quit within the past 15 years.  Fecal occult blood test (FOBT) of the stool. You may have this test every year starting at age 50.  Flexible sigmoidoscopy or colonoscopy. You may have a sigmoidoscopy every 5 years or a colonoscopy every 10 years starting at age 16.  Prostate cancer screening. Recommendations will vary depending on your family history and other risks.  Hepatitis C blood test.  Hepatitis B blood test.  Sexually transmitted disease (STD) testing.  Diabetes screening. This is done by checking your blood sugar (glucose) after you have not eaten for a while (fasting). You may have this done every 1-3 years.  Abdominal aortic aneurysm (AAA) screening. You may need this if you are a current or former smoker.  Osteoporosis. You may be screened starting at age 60 if you are at high risk. Talk with your health care provider about your test results, treatment options, and if necessary, the need for more tests. Vaccines  Your health care provider may recommend certain vaccines, such as:  Influenza vaccine. This is recommended  every year.  Tetanus, diphtheria, and acellular pertussis (Tdap, Td) vaccine. You may need a Td booster every 10 years.  Zoster vaccine. You may need this after age 21.  Pneumococcal 13-valent conjugate (PCV13) vaccine. One dose is recommended after age  23.  Pneumococcal polysaccharide (PPSV23) vaccine. One dose is recommended after age 30. Talk to your health care provider about which screenings and vaccines you need and how often you need them. This information is not intended to replace advice given to you by your health care provider. Make sure you discuss any questions you have with your health care provider. Document Released: 10/15/2015 Document Revised: 06/07/2016 Document Reviewed: 07/20/2015 Elsevier Interactive Patient Education  2017 [place] Prevention in the Home Falls can cause injuries. They can happen to people of all ages. There are many things you can do to make your home safe and to help prevent falls. What can I do on the outside of my home?  Regularly fix the edges of walkways and driveways and fix any cracks.  Remove anything that might make you trip as you walk through a door, such as a raised step or threshold.  Trim any bushes or trees on the path to your home.  Use bright outdoor lighting.  Clear any walking paths of anything that might make someone trip, such as rocks or tools.  Regularly check to see if handrails are loose or broken. Make sure that both sides of any steps have handrails.  Any raised decks and porches should have guardrails on the edges.  Have any leaves, snow, or ice cleared regularly.  Use sand or salt on walking paths during winter.  Clean up any spills in your garage right away. This includes oil or grease spills. What can I do in the bathroom?  Use night lights.  Install grab bars by the toilet and in the tub and shower. Do not use towel bars as grab bars.  Use non-skid mats or decals in the tub or shower.  If you need to sit down in the shower, use a plastic, non-slip stool.  Keep the floor dry. Clean up any water that spills on the floor as soon as it happens.  Remove soap buildup in the tub or shower regularly.  Attach bath mats securely with double-sided  non-slip rug tape.  Do not have throw rugs and other things on the floor that can make you trip. What can I do in the bedroom?  Use night lights.  Make sure that you have a light by your bed that is easy to reach.  Do not use any sheets or blankets that are too big for your bed. They should not hang down onto the floor.  Have a firm chair that has side arms. You can use this for support while you get dressed.  Do not have throw rugs and other things on the floor that can make you trip. What can I do in the kitchen?  Clean up any spills right away.  Avoid walking on wet floors.  Keep items that you use a lot in easy-to-reach places.  If you need to reach something above you, use a strong step stool that has a grab bar.  Keep electrical cords out of the way.  Do not use floor polish or wax that makes floors slippery. If you must use wax, use non-skid floor wax.  Do not have throw rugs and other things on the floor that can make you trip. What  can I do with my stairs?  Do not leave any items on the stairs.  Make sure that there are handrails on both sides of the stairs and use them. Fix handrails that are broken or loose. Make sure that handrails are as long as the stairways.  Check any carpeting to make sure that it is firmly attached to the stairs. Fix any carpet that is loose or worn.  Avoid having throw rugs at the top or bottom of the stairs. If you do have throw rugs, attach them to the floor with carpet tape.  Make sure that you have a light switch at the top of the stairs and the bottom of the stairs. If you do not have them, ask someone to add them for you. What else can I do to help prevent falls?  Wear shoes that:  Do not have high heels.  Have rubber bottoms.  Are comfortable and fit you well.  Are closed at the toe. Do not wear sandals.  If you use a stepladder:  Make sure that it is fully opened. Do not climb a closed stepladder.  Make sure that both  sides of the stepladder are locked into place.  Ask someone to hold it for you, if possible.  Clearly mark and make sure that you can see:  Any grab bars or handrails.  First and last steps.  Where the edge of each step is.  Use tools that help you move around (mobility aids) if they are needed. These include:  Canes.  Walkers.  Scooters.  Crutches.  Turn on the lights when you go into a dark area. Replace any light bulbs as soon as they burn out.  Set up your furniture so you have a clear path. Avoid moving your furniture around.  If any of your floors are uneven, fix them.  If there are any pets around you, be aware of where they are.  Review your medicines with your doctor. Some medicines can make you feel dizzy. This can increase your chance of falling. Ask your doctor what other things that you can do to help prevent falls. This information is not intended to replace advice given to you by your health care provider. Make sure you discuss any questions you have with your health care provider. Document Released: 07/15/2009 Document Revised: 02/24/2016 Document Reviewed: 10/23/2014 Elsevier Interactive Patient Education  2017 [employer].

## 2020-11-17 ENCOUNTER — Other Ambulatory Visit: Payer: Self-pay | Admitting: Physician Assistant

## 2020-11-22 ENCOUNTER — Telehealth: Payer: Self-pay

## 2020-11-22 NOTE — Telephone Encounter (Signed)
Left patient voice mail message in regards to telephone visit appointment with Freeman Caldron PA on 12/01/20 @ 10:00am. Called to review medications, AUA and prostate questions. TM

## 2020-11-24 ENCOUNTER — Telehealth: Payer: Self-pay

## 2020-11-24 ENCOUNTER — Encounter: Payer: Self-pay | Admitting: Urology

## 2020-11-24 DIAGNOSIS — Z5111 Encounter for antineoplastic chemotherapy: Secondary | ICD-10-CM | POA: Diagnosis not present

## 2020-11-24 DIAGNOSIS — C61 Malignant neoplasm of prostate: Secondary | ICD-10-CM | POA: Diagnosis not present

## 2020-11-24 NOTE — Telephone Encounter (Signed)
Spoke with patient in regards to telephone appointment with Freeman Caldron PA on 12/01/20 @ 10:00am. Patient verbalized understanding of appointment date and time. Reviewed medications, AUA and prostate questions.

## 2020-11-24 NOTE — Progress Notes (Signed)
Patient has telephone visit with Ashlyn Bruning PA. Patient states nocturia 2 times per night. Patient denies dysuria. Patient states bowel movements are back to normal. Patient states urine stream is strong and steady. Patient states that he is emptying his bladder completely. Patient states urgency and is able to hold his urine. Patient denies pushing or straining at the start of urination. Patient denies fatigue. Patient denies leakage. Patient states that he is taking Alfuzosin as directed. Patient states that he is waiting for a follow-up appointment from [facility].

## 2020-11-29 ENCOUNTER — Other Ambulatory Visit: Payer: Self-pay | Admitting: Cardiology

## 2020-11-29 DIAGNOSIS — I2510 Atherosclerotic heart disease of native coronary artery without angina pectoris: Secondary | ICD-10-CM

## 2020-12-01 ENCOUNTER — Ambulatory Visit
Admission: RE | Admit: 2020-12-01 | Discharge: 2020-12-01 | Disposition: A | Discharge status: Home / Self Care | Payer: PPO | Source: Ambulatory Visit | Attending: Urology | Admitting: Urology

## 2020-12-01 ENCOUNTER — Other Ambulatory Visit: Payer: Self-pay

## 2020-12-01 DIAGNOSIS — C61 Malignant neoplasm of prostate: Secondary | ICD-10-CM

## 2020-12-01 NOTE — Progress Notes (Signed)
Radiation Oncology         [phone]) [phone] ________________________________  Name: Nicholas Maxwell MRN: [id number]  Date: 12/01/2020  DOB: Nov 30, 1945  Post Treatment Note  CC: [name], [place]  [name], MD  Diagnosis:   75 y.o. 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  He tolerated radiation treatment relatively well with only minor urinary irritation and modest fatigue.  He denied urinary hesitancy, straining, hematuria, or dysuria. He had nocturia x4, increased frequency, urgency and weaker flow of stream. He denied any bowel issues.                              On review of systems, the patient states that he is doing well in general.  He has noticed gradual improvement in his LUTS, currently with nocturia x2 and otherwise, without complaints.  His current IPSS score is 2, indicating mild urinary symptoms only.  He continues taking Uroxatrol daily as prescribed.  He specifically denies dysuria, gross hematuria, straining to void, incomplete bladder emptying or incontinence.  He continues with mild to moderate fatigue but feels that he is tolerating the ADT fairly well and is scheduled for his 6 monthly injection later this month.  Overall, he is quite pleased with his progress to date.  ALLERGIES:  is allergic to crestor [rosuvastatin calcium] and statins.  Meds: Current Outpatient Medications  Medication Sig Dispense Refill  . alfuzosin (UROXATRAL) 10 MG 24 hr tablet Take 10 mg by mouth daily with supper.    Marland Kitchen allopurinol (ZYLOPRIM) 300 MG tablet TAKE 1 TABLET (300 MG TOTAL) BY MOUTH DAILY WITH LUNCH. 90 tablet 1  . ASPIRIN 81 81 MG chewable  tablet CHEW 1 TABLET(S) TWICE A DAY BY ORAL ROUTE FOR 4 WEEKS FOR 30 DAYS.    . Calcium Carb-Cholecalciferol (CALCIUM 600+D3 PO) Take by mouth.    . Cyanocobalamin (B-12) 2500 MCG TABS Take 1 tablet by mouth.    . nitroGLYCERIN (NITROSTAT) 0.4 MG SL tablet Place 1 tablet (0.4 mg total) under the tongue every 5 (five) minutes x 3 doses as needed for chest pain. 30 tablet 2  . Potassium Citrate 15 MEQ (1620 MG) TBCR Take 2 tablets by mouth 2 (two) times daily.    . ranolazine (RANEXA) 500 MG 12 hr tablet TAKE 2 TABLETS BY MOUTH TWICE A DAY 360 tablet 0  . REPATHA SURECLICK 403 MG/ML SOAJ INJECT 1ML EVERY 2 WEEKS 2 mL 3  . amoxicillin (AMOXIL) 500 MG capsule TAKE 4 CAPSULES BY MOUTH 1 HOUR PRIOR TO DENTAL WORK (Patient not taking: No sig reported)    . metoprolol succinate (TOPROL-XL) 25 MG 24 hr tablet TAKE 1 TABLET BY MOUTH EVERY DAY 90 tablet 0   No current facility-administered medications for this encounter.    Physical Findings:  vitals were not taken for this visit.   /Unable to assess due to telephone follow-up visit format.  Lab Findings: Lab Results  Component Value Date   WBC 5.6 09/07/2020   HGB 12.1 (L) 09/07/2020   HCT 34.6 (L) 09/07/2020   MCV 99.4 09/07/2020   PLT 218  09/07/2020     Radiographic Findings: No results found.  Impression/Plan: 1. 75 y.o. 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 Jeffie Pollock to his knowledge. He continues to tolerate the ADT fairly well and understands what to expect with regards to PSA monitoring going forward.  He is scheduled for his 6 monthly ADT injection later this month.  He continues to take Uroxatrol as prescribed with well-controlled LUTS.  I will look forward to following his response to treatment via  correspondence with urology, and would be happy to continue to participate in his care if clinically indicated. I talked to the patient about what to expect in the future, including his risk for erectile dysfunction and rectal bleeding. I encouraged him to call or return to the office if he has any questions regarding his previous radiation or possible radiation side effects. He was comfortable with this plan and will follow up as needed.   Nicholos Johns, MMS, PA-C [place] [facility] [place]: [phone]  Fax: [phone]

## 2020-12-03 ENCOUNTER — Telehealth: Payer: Self-pay

## 2020-12-03 NOTE — Chronic Care Management (AMB) (Signed)
Chronic Care Management Pharmacy Assistant   Name: Nicholas Maxwell  MRN: [id number] DOB: [birth year]  Reason for Encounter: Medication Review  PCP : [name], PA  Allergies:   Allergies  Allergen Reactions  . Crestor [Rosuvastatin Calcium] Anaphylaxis and Swelling    Tongue swells/throat closes  . Statins Other (See Comments)    Muscle/joint pain with ALL other statin drug but Crestor is severe    Medications: Outpatient Encounter Medications as of 12/03/2020  Medication Sig  . alfuzosin (UROXATRAL) 10 MG 24 hr tablet Take 10 mg by mouth daily with supper.  Marland Kitchen allopurinol (ZYLOPRIM) 300 MG tablet TAKE 1 TABLET (300 MG TOTAL) BY MOUTH DAILY WITH LUNCH.  Marland Kitchen amoxicillin (AMOXIL) 500 MG capsule TAKE 4 CAPSULES BY MOUTH 1 HOUR PRIOR TO DENTAL WORK (Patient not taking: No sig reported)  . ASPIRIN 81 81 MG chewable tablet CHEW 1 TABLET(S) TWICE A DAY BY ORAL ROUTE FOR 4 WEEKS FOR 30 DAYS.  . Calcium Carb-Cholecalciferol (CALCIUM 600+D3 PO) Take by mouth.  . Cyanocobalamin (B-12) 2500 MCG TABS Take 1 tablet by mouth.  . metoprolol succinate (TOPROL-XL) 25 MG 24 hr tablet TAKE 1 TABLET BY MOUTH EVERY DAY  . nitroGLYCERIN (NITROSTAT) 0.4 MG SL tablet Place 1 tablet (0.4 mg total) under the tongue every 5 (five) minutes x 3 doses as needed for chest pain.  Marland Kitchen Potassium Citrate 15 MEQ (1620 MG) TBCR Take 2 tablets by mouth 2 (two) times daily.  . ranolazine (RANEXA) 500 MG 12 hr tablet TAKE 2 TABLETS BY MOUTH TWICE A DAY  . REPATHA SURECLICK 681 MG/ML SOAJ INJECT 1ML EVERY 2 WEEKS   No facility-administered encounter medications on file as of 12/03/2020.    Current Diagnosis: Patient Active Problem List   Diagnosis Date Noted  . Chronic kidney disease, stage 3b ([place]) 07/19/2020  . Malignant neoplasm of prostate ([place]) 06/11/2020  . S/P right TKA 10/23/2019  . COVID-19 09/07/2019  . Statin intolerance 11/21/2018  . 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  History of kidney stones 02/26/2018  . Osteoarthritis of right knee 02/26/2018  . Post PTCA 05/24/2017  . Myocardial infarction ([place]) 04/2017  . Polyp of colon, adenomatous 12/08/2016  . Arthritis of carpometacarpal [facility]) joint of left thumb 11/26/2016  . Essential hypertension 05/12/2015  . 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 Hypertrophic and atrophic condition of skin 05/12/2015  . Hyperlipidemia 05/12/2015  . Other seborrheic keratosis 05/12/2015  . Type 2 diabetes mellitus without complication, without long-term current use of insulin ([place]) 05/12/2015  . Hypertrophy of prostate without urinary obstruction and other lower urinary tract symptoms (LUTS) 04/08/2013    Per [employer] they have not yet received patient assistance application for medication Repatha.  I spoke with patient he states he still has both applications for medications Repatha and Ranexa. Patient states he is waiting to get his tax information together from last and will send to the prescribing provider once he is finished doing so.  Medication Cost Review performed for pharmacies [facility] vs [facility] vs [facility]  All three pharmacies are standard in- network with [employer] with [facility] being the more expensive of the 3. [facility] and [facility] about the same prices. However [facility] is the cheapest option.  I communicated these findings with the patient. He states he wants to think about what switching to [facility]. He states he will call to let us know if he decides to onboard with [facility].  April D  Governor Specking, [place] Pharmacist Assistant [phone]   Follow-Up:  Medication Cost Review, Patient Assistance Coordination and Pharmacist Review

## 2020-12-06 ENCOUNTER — Other Ambulatory Visit: Payer: Self-pay

## 2020-12-06 ENCOUNTER — Ambulatory Visit (INDEPENDENT_AMBULATORY_CARE_PROVIDER_SITE_OTHER): Payer: PPO | Admitting: Physician Assistant

## 2020-12-06 ENCOUNTER — Encounter: Payer: Self-pay | Admitting: Physician Assistant

## 2020-12-06 VITALS — BP 120/70 | HR 56 | Temp 97.5°F | Ht 69.0 in | Wt 205.0 lb

## 2020-12-06 DIAGNOSIS — E1122 Type 2 diabetes mellitus with diabetic chronic kidney disease: Secondary | ICD-10-CM | POA: Diagnosis not present

## 2020-12-06 DIAGNOSIS — N189 Chronic kidney disease, unspecified: Secondary | ICD-10-CM

## 2020-12-06 DIAGNOSIS — I10 Essential (primary) hypertension: Secondary | ICD-10-CM | POA: Diagnosis not present

## 2020-12-06 DIAGNOSIS — E119 Type 2 diabetes mellitus without complications: Secondary | ICD-10-CM

## 2020-12-06 NOTE — Progress Notes (Signed)
Nicholas Maxwell is a 75 y.o. male is here for 3 month follow up.  I acted as a Education administrator for [employer], PA-C Anselmo Pickler, LPN   History of Present Illness:   Chief Complaint  Patient presents with  . Diabetes  . Hypertension    HPI   Diabetes Pt here for 3 month follow-up. Current DM meds: none. Blood sugars at home averaging 130 in the morning. Denies: hypoglycemic or hyperglycemic episodes or symptoms. This patient's diabetes is complicated by HTN, CKD. Has had increased appetite for sweets. Still going to the [facility] two times a week.  Lab Results  Component Value Date   HGBA1C 6.1 (H) 09/07/2020    HTN Currently taking Metoprolol XL 25 mg daily. At home blood pressure readings are averaging systolic 810, diastolic 17-51. Pt denies headaches, dizziness, blurred vision, chest pain, SOB or lower leg edema. Denies excessive caffeine intake, stimulant usage, excessive alcohol intake or increase in salt consumption.  BP Readings from Last 3 Encounters:  12/06/20 120/70  11/15/20 120/66  09/07/20 110/60   Wt Readings from Last 4 Encounters:  12/06/20 205 lb (93 kg)  11/15/20 205 lb 12.8 oz (93.4 kg)  09/07/20 201 lb 4 oz (91.3 kg)  06/24/20 199 lb (90.3 kg)      Health Maintenance Due  Topic Date Due  . URINE MICROALBUMIN  Never done  . COVID-19 Vaccine (1) Never done  . FOOT EXAM  11/21/2019    Past Medical History:  Diagnosis Date  . History of kidney stones   . Myocardial infarction ([place]) 04/2017  . Prostate cancer ([place]) 05/2020  . Statin intolerance    Used >2 different statins     Social History   Tobacco Use  . Smoking status: Former Smoker    Packs/day: 1.00    Years: 30.00    Pack years: 30.00    Types: Cigarettes    Quit date: 2003    Years since quitting: 19.1  . Smokeless tobacco: Never Used  Vaping Use  . Vaping Use: Never used  Substance Use Topics  . Alcohol use: Not Currently    Comment: has drank since 2014  . Drug use: No     Past Surgical History:  Procedure Laterality Date  . COLONOSCOPY     March of 2018   . CORONARY ANGIOPLASTY WITH STENT PLACEMENT  05/24/2017  . CORONARY STENT INTERVENTION N/A 05/24/2017   Procedure: CORONARY STENT INTERVENTION;  Surgeon: Nigel Mormon, MD;  Location: [place] [facility];  Service: Cardiovascular;  Laterality: N/A;  MID Circumflex 4.0x22 Onyx MID LAD 2.75x15 Onyx  . CYSTOSCOPY WITH URETEROSCOPY, STONE BASKETRY AND STENT PLACEMENT  ~ 1990  . HERNIA REPAIR    . INTRAVASCULAR PRESSURE WIRE/FFR STUDY N/A 05/24/2017   Procedure: INTRAVASCULAR PRESSURE WIRE/FFR STUDY;  Surgeon: Nigel Mormon, MD;  Location: [place] [facility];  Service: Cardiovascular;  Laterality: N/A;  MID LAD  . KNEE ARTHROSCOPY Right 2016  . LEFT HEART CATH AND CORONARY ANGIOGRAPHY N/A 05/24/2017   Procedure: LEFT HEART CATH AND CORONARY ANGIOGRAPHY;  Surgeon: Nigel Mormon, MD;  Location: [place] [facility];  Service: Cardiovascular;  Laterality: N/A;  . 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   Procedure: TOTAL KNEE ARTHROPLASTY;  Surgeon: Paralee Cancel, MD;  Location: [facility];  Service: Orthopedics;  Laterality: Right;  70 mins  . UMBILICAL HERNIA REPAIR  1992  . UPPER GASTROINTESTINAL ENDOSCOPY      Family History  Problem Relation  Age of Onset  . Transient ischemic attack Father 44  . Bone cancer Father   . CAD Mother 38       CABG  . Diabetes Mother   . Breast cancer Neg Hx   . Colon cancer Neg Hx   . Pancreatic cancer Neg Hx   . Prostate cancer Neg Hx     PMHx, SurgHx, SocialHx, FamHx, Medications, and Allergies were reviewed in the Visit Navigator and updated as appropriate.   Patient Active Problem List   Diagnosis Date Noted  . Chronic kidney disease, stage 3b ([place]) 07/19/2020  . Malignant neoplasm of prostate ([place]) 06/11/2020  . S/P right TKA 10/23/2019  . COVID-19 09/07/2019  . Statin intolerance 11/21/2018  . Coronary artery disease without angina pectoris  02/26/2018  . History of kidney stones 02/26/2018  . Osteoarthritis of right knee 02/26/2018  . Post PTCA 05/24/2017  . Myocardial infarction ([place]) 04/2017  . Polyp of colon, adenomatous 12/08/2016  . Arthritis of carpometacarpal [facility]) joint of left thumb 11/26/2016  . Essential hypertension 05/12/2015  . 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 Hypertrophic and atrophic condition of skin 05/12/2015  . Hyperlipidemia 05/12/2015  . Other seborrheic keratosis 05/12/2015  . Type 2 diabetes mellitus without complication, without long-term current use of insulin ([place]) 05/12/2015  . Hypertrophy of prostate without urinary obstruction and other lower urinary tract symptoms (LUTS) 04/08/2013    Social History   Tobacco Use  . Smoking status: Former Smoker    Packs/day: 1.00    Years: 30.00    Pack years: 30.00    Types: Cigarettes    Quit date: 2003    Years since quitting: 19.1  . Smokeless tobacco: Never Used  Vaping Use  . Vaping Use: Never used  Substance Use Topics  . Alcohol use: Not Currently    Comment: has drank since 2014  . Drug use: No    Current Medications and Allergies:    Current Outpatient Medications:  .  alfuzosin (UROXATRAL) 10 MG 24 hr tablet, Take 10 mg by mouth daily with supper., Disp: , Rfl:  .  allopurinol (ZYLOPRIM) 300 MG tablet, TAKE 1 TABLET (300 MG TOTAL) BY MOUTH DAILY WITH LUNCH., Disp: 90 tablet, Rfl: 1 .  amoxicillin (AMOXIL) 500 MG capsule, TAKE 4 CAPSULES BY MOUTH 1 HOUR PRIOR TO DENTAL WORK, Disp: , Rfl:  .  ASPIRIN 81 81 MG chewable tablet, CHEW 1 TABLET(S) TWICE A DAY BY ORAL ROUTE FOR 4 WEEKS FOR 30 DAYS., Disp: , Rfl:  .  Calcium Carb-Cholecalciferol (CALCIUM 600+D3 PO), Take by mouth., Disp: , Rfl:  .  Cyanocobalamin (B-12) 2500 MCG TABS, Take 1 tablet by mouth., Disp: , Rfl:  .  metoprolol succinate (TOPROL-XL) 25 MG 24 hr tablet, TAKE 1 TABLET BY MOUTH EVERY DAY, Disp: 90 tablet, Rfl: 0 .  nitroGLYCERIN (NITROSTAT) 0.4 MG SL tablet, Place 1  tablet (0.4 mg total) under the tongue every 5 (five) minutes x 3 doses as needed for chest pain., Disp: 30 tablet, Rfl: 2 .  Potassium Citrate 15 MEQ (1620 MG) TBCR, Take 2 tablets by mouth 2 (two) times daily., Disp: , Rfl:  .  ranolazine (RANEXA) 500 MG 12 hr tablet, TAKE 2 TABLETS BY MOUTH TWICE A DAY, Disp: 360 tablet, Rfl: 0 .  REPATHA SURECLICK 809 MG/ML SOAJ, INJECT 1ML EVERY 2 WEEKS, Disp: 2 mL, Rfl: 3   Allergies  Allergen Reactions  . Crestor [Rosuvastatin Calcium] Anaphylaxis and Swelling    Tongue swells/throat closes  .  Statins Other (See Comments)    Muscle/joint pain with ALL other statin drug but Crestor is severe    Review of Systems   ROS  Negative unless otherwise specified per HPI.  Vitals:   Vitals:   12/06/20 0936  BP: 120/70  Pulse: (!) 56  Temp: (!) 97.5 F (36.4 C)  TempSrc: Temporal  SpO2: 97%  Weight: 205 lb (93 kg)  Height: 5\' 9"  (1.753 m)     Body mass index is 30.27 kg/m.   Physical Exam:    Physical Exam Vitals and nursing note reviewed.  Constitutional:      General: He is not in acute distress.    Appearance: He is well-developed. He is not ill-appearing, toxic-appearing or sickly-appearing.  Cardiovascular:     Rate and Rhythm: Normal rate and regular rhythm.     Pulses: Normal pulses.     Heart sounds: Normal heart sounds, S1 normal and S2 normal.     Comments: No LE edema Pulmonary:     Effort: Pulmonary effort is normal.     Breath sounds: Normal breath sounds.  Skin:    General: Skin is warm, dry and intact.  Neurological:     Mental Status: He is alert.     GCS: GCS eye subscore is 4. GCS verbal subscore is 5. GCS motor subscore is 6.  Psychiatric:        Mood and Affect: Mood and affect normal.        Speech: Speech normal.        Behavior: Behavior normal. Behavior is cooperative.      Assessment and Plan:    Indy was seen today for diabetes and hypertension.  Diagnoses and all orders for this  visit:  Type 2 diabetes mellitus without complication, without long-term current use of insulin (HCC) Overall doing well. Possible increase of HgbA1c expected due to recent radiation and hormonal treatment; has also had slight weight gain and increase in sugar cravings. He will return tomorrow for bloodwork (today is exactly 90 days from last a1c.) Continue healthy lifestyle as able. Follow-up and interventions based on results. -     Basic metabolic panel; Future -     Hemoglobin A1c; Future  Essential hypertension Well controlled. Continue toprol XL 25 mg daily. Follow-up with cardiology as recommended. -     Basic metabolic panel; Future  CMA or LPN served as scribe during this visit. History, Physical, and Plan performed by medical provider. The above documentation has been reviewed and is accurate and complete.   [name], PA-C [place], [place] 12/06/2020  Follow-up: No follow-ups on file.

## 2020-12-06 NOTE — Patient Instructions (Signed)
It was great to see you!  Please schedule a lab visit tomorrow (or any other day this week) to update your blood work.  I will be in touch when this result returns.  Take care,  [name] PA-C

## 2020-12-07 ENCOUNTER — Other Ambulatory Visit (INDEPENDENT_AMBULATORY_CARE_PROVIDER_SITE_OTHER): Payer: PPO

## 2020-12-07 DIAGNOSIS — E119 Type 2 diabetes mellitus without complications: Secondary | ICD-10-CM | POA: Diagnosis not present

## 2020-12-07 DIAGNOSIS — I10 Essential (primary) hypertension: Secondary | ICD-10-CM

## 2020-12-07 LAB — BASIC METABOLIC PANEL
BUN: 30 mg/dL — ABNORMAL HIGH (ref 6–23)
CO2: 28 mEq/L (ref 19–32)
Calcium: 9.6 mg/dL (ref 8.4–10.5)
Chloride: 101 mEq/L (ref 96–112)
Creatinine, Ser: 1.54 mg/dL — ABNORMAL HIGH (ref 0.40–1.50)
GFR: 44.38 mL/min — ABNORMAL LOW (ref >60.00)
Glucose, Bld: 164 mg/dL — ABNORMAL HIGH (ref 70–99)
Potassium: 4.8 mEq/L (ref 3.5–5.1)
Sodium: 137 mEq/L (ref 135–145)

## 2020-12-07 LAB — HEMOGLOBIN A1C: Hgb A1c MFr Bld: 6.6 % — ABNORMAL HIGH (ref 4.6–6.5)

## 2020-12-24 DIAGNOSIS — C61 Malignant neoplasm of prostate: Secondary | ICD-10-CM | POA: Diagnosis not present

## 2020-12-24 DIAGNOSIS — Z5111 Encounter for antineoplastic chemotherapy: Secondary | ICD-10-CM | POA: Diagnosis not present

## 2021-01-03 ENCOUNTER — Other Ambulatory Visit: Payer: Self-pay | Admitting: Cardiology

## 2021-01-09 ENCOUNTER — Other Ambulatory Visit: Payer: Self-pay | Admitting: Cardiology

## 2021-02-16 ENCOUNTER — Telehealth: Payer: Self-pay

## 2021-02-16 NOTE — Chronic Care Management (AMB) (Signed)
    Chronic Care Management Pharmacy Assistant   Name: Nicholas Maxwell  MRN: [id number] DOB: [birth year]  Reason for Encounter:General Adherence Call   Recent office visits:  12/06/20- [name], PA- seen for 3 month f/u for dm and htn, labs ordered, no medication changes, no follow up documented  Recent consult visits:  No visits noted  Hospital visits:  None in previous 6 months  Medications: Outpatient Encounter Medications as of 02/16/2021  Medication Sig  . alfuzosin (UROXATRAL) 10 MG 24 hr tablet Take 10 mg by mouth daily with supper.  Marland Kitchen allopurinol (ZYLOPRIM) 300 MG tablet TAKE 1 TABLET (300 MG TOTAL) BY MOUTH DAILY WITH LUNCH.  Marland Kitchen amoxicillin (AMOXIL) 500 MG capsule TAKE 4 CAPSULES BY MOUTH 1 HOUR PRIOR TO DENTAL WORK  . ASPIRIN 81 81 MG chewable tablet CHEW 1 TABLET(S) TWICE A DAY BY ORAL ROUTE FOR 4 WEEKS FOR 30 DAYS.  . Calcium Carb-Cholecalciferol (CALCIUM 600+D3 PO) Take by mouth.  . Cyanocobalamin (B-12) 2500 MCG TABS Take 1 tablet by mouth.  . metoprolol succinate (TOPROL-XL) 25 MG 24 hr tablet TAKE 1 TABLET BY MOUTH EVERY DAY  . nitroGLYCERIN (NITROSTAT) 0.4 MG SL tablet Place 1 tablet (0.4 mg total) under the tongue every 5 (five) minutes x 3 doses as needed for chest pain.  Marland Kitchen Potassium Citrate 15 MEQ (1620 MG) TBCR Take 2 tablets by mouth 2 (two) times daily.  . ranolazine (RANEXA) 500 MG 12 hr tablet TAKE 2 TABLETS BY MOUTH TWICE A DAY  . REPATHA SURECLICK 569 MG/ML SOAJ INJECT 1ML EVERY 2 WEEKS   No facility-administered encounter medications on file as of 02/16/2021.    I spoke with Nicholas Maxwell and he has been doing wonderfully. He was on a trip last week with his wife so he was unable to talk with me then. We did speak about his trip and he seemed to enjoy it very much. He spent all week at the beach and did a lot of things like riding bikes. He stated that he has been enjoying the weather since it's been nice. Since it is raining today he is staying in and  enjoying some soup. He has not had any recent changes in his health and has no concerns. Nicholas Maxwell has been doing well overall.   [employer] [employer] CPA, CMA

## 2021-02-19 ENCOUNTER — Other Ambulatory Visit: Payer: Self-pay | Admitting: Cardiology

## 2021-02-19 DIAGNOSIS — I2510 Atherosclerotic heart disease of native coronary artery without angina pectoris: Secondary | ICD-10-CM

## 2021-02-21 DIAGNOSIS — E119 Type 2 diabetes mellitus without complications: Secondary | ICD-10-CM | POA: Diagnosis not present

## 2021-02-21 DIAGNOSIS — I209 Angina pectoris, unspecified: Secondary | ICD-10-CM | POA: Diagnosis not present

## 2021-02-21 DIAGNOSIS — Z9079 Acquired absence of other genital organ(s): Secondary | ICD-10-CM | POA: Diagnosis not present

## 2021-02-21 DIAGNOSIS — C61 Malignant neoplasm of prostate: Secondary | ICD-10-CM | POA: Diagnosis not present

## 2021-02-21 DIAGNOSIS — Z923 Personal history of irradiation: Secondary | ICD-10-CM | POA: Diagnosis not present

## 2021-03-01 DIAGNOSIS — C61 Malignant neoplasm of prostate: Secondary | ICD-10-CM | POA: Diagnosis not present

## 2021-03-01 LAB — PSA: PSA: 0.092

## 2021-03-01 LAB — TESTOSTERONE: Testosterone: 25.4

## 2021-03-07 DIAGNOSIS — E349 Endocrine disorder, unspecified: Secondary | ICD-10-CM | POA: Diagnosis not present

## 2021-03-07 DIAGNOSIS — C61 Malignant neoplasm of prostate: Secondary | ICD-10-CM | POA: Diagnosis not present

## 2021-03-07 DIAGNOSIS — R351 Nocturia: Secondary | ICD-10-CM | POA: Diagnosis not present

## 2021-03-07 DIAGNOSIS — N403 Nodular prostate with lower urinary tract symptoms: Secondary | ICD-10-CM | POA: Diagnosis not present

## 2021-03-08 ENCOUNTER — Encounter: Payer: Self-pay | Admitting: Physician Assistant

## 2021-03-15 DIAGNOSIS — N1832 Chronic kidney disease, stage 3b: Secondary | ICD-10-CM | POA: Diagnosis not present

## 2021-03-21 DIAGNOSIS — M1000 Idiopathic gout, unspecified site: Secondary | ICD-10-CM | POA: Diagnosis not present

## 2021-03-21 DIAGNOSIS — I2510 Atherosclerotic heart disease of native coronary artery without angina pectoris: Secondary | ICD-10-CM | POA: Diagnosis not present

## 2021-03-21 DIAGNOSIS — I129 Hypertensive chronic kidney disease with stage 1 through stage 4 chronic kidney disease, or unspecified chronic kidney disease: Secondary | ICD-10-CM | POA: Diagnosis not present

## 2021-03-21 DIAGNOSIS — N1832 Chronic kidney disease, stage 3b: Secondary | ICD-10-CM | POA: Diagnosis not present

## 2021-03-21 DIAGNOSIS — C61 Malignant neoplasm of prostate: Secondary | ICD-10-CM | POA: Diagnosis not present

## 2021-03-21 DIAGNOSIS — Z87442 Personal history of urinary calculi: Secondary | ICD-10-CM | POA: Diagnosis not present

## 2021-03-21 DIAGNOSIS — E1122 Type 2 diabetes mellitus with diabetic chronic kidney disease: Secondary | ICD-10-CM | POA: Diagnosis not present

## 2021-03-31 ENCOUNTER — Other Ambulatory Visit: Payer: Self-pay | Admitting: Cardiology

## 2021-04-04 ENCOUNTER — Other Ambulatory Visit: Payer: Self-pay | Admitting: Cardiology

## 2021-04-11 ENCOUNTER — Telehealth: Payer: PPO

## 2021-04-25 LAB — HM DIABETES EYE EXAM

## 2021-05-06 ENCOUNTER — Encounter: Payer: Self-pay | Admitting: Physician Assistant

## 2021-05-10 ENCOUNTER — Ambulatory Visit (INDEPENDENT_AMBULATORY_CARE_PROVIDER_SITE_OTHER): Payer: PPO | Admitting: Pharmacist

## 2021-05-10 ENCOUNTER — Other Ambulatory Visit: Payer: Self-pay | Admitting: Physician Assistant

## 2021-05-10 ENCOUNTER — Telehealth: Payer: PPO

## 2021-05-10 DIAGNOSIS — E782 Mixed hyperlipidemia: Secondary | ICD-10-CM | POA: Diagnosis not present

## 2021-05-10 DIAGNOSIS — I10 Essential (primary) hypertension: Secondary | ICD-10-CM | POA: Diagnosis not present

## 2021-05-10 NOTE — Patient Instructions (Signed)
Visit Information   Goals Addressed   None    Patient Care Plan: [facility] AMB "PATIENT-SPECIFIC PROBLEM"     Problem Identified: CAD, HTN, HLD and DMII   Priority: High  Note:   Pharmacist Clinical Goal(s):  Over the next 365 days, patient will verbalize ability to afford treatment regimen through collaboration with PharmD and provider.   Interventions: 1:1 collaboration with [name], PA regarding development and update of comprehensive plan of care as evidenced by provider attestation and co-signature Inter-disciplinary care team collaboration (see longitudinal plan of care) Comprehensive medication review performed; medication list updated in electronic medical record  Hypertension (BP goal <130/80) -controlled -Current treatment: Metoprolol succinate 25 mg once daily  Ranolazine 500 mg every 12 hours  -Medications previously tried: ramipril 5 mg once daily  -Current home readings: 100-110-60s  -Current dietary habits: low sodium -Current exercise habits: [employer] deep water aerobics multiple times per week.  -Denies hypotensive/hypertensive symptoms -Educated on BP goals and benefits of medications for prevention of heart attack, stroke and kidney damage; Daily salt intake goal < 2300 mg; Exercise goal of 150 minutes per week; Importance of home blood pressure monitoring; -Counseled to monitor BP at home at least once every 1-2 weeks, document, and provide log at future appointments -Counseled on diet and exercise extensively Recommended to continue current medication Counseled on signs of low blood pressure  Hyperlipidemia: (LDL goal < 70) -controlled -Current treatment: Repatha 140 mg injection every 2 weeks -Medications previously tried: statin intolerant/allergy  -Current dietary patterns: see diabetes -Current exercise habits: see diabetes -Educated on Cholesterol goals;  -Counseled on diet and exercise extensively Recommended to continue current  medication  Diabetes (A1c goal <7%) -controlled -Current medications: n/a -Medications previously tried: metformin  -Current home glucose readings fasting glucose: n/a post prandial glucose: n/a -Denies hypoglycemic/hyperglycemic symptoms -Current meal patterns:  breakfast: cereal, maybe egg, small piece of bacon, oatmeal, pancakes  lunch: toss salad  dinner: minimal sandwiches. Fish, tuna. Green vegetables, raw fruits. snacks: dry roasted peanuts, minimal chips  drinks: no coffee, apple cider or orange juice -Current exercise: [employer] deep water aerobics three times -Educated onA1c and blood sugar goals; Complications of diabetes including kidney damage, retinal damage, and cardiovascular disease; Exercise goal of 150 minutes per week; -Counseled to check feet daily and get yearly eye exams -Counseled on diet and exercise extensively  Patient Goals/Self-Care Activities Over the next 365 days, patient will:  - take medications as prescribed check blood pressure once every 1-2 weeks, document, and provide at future appointments  Follow Up Plan: Telephone follow up appointment with care management team member scheduled for: 05/2021    Long-Range Goal: Patient Stated   Start Date: 11/08/2020  Expected End Date: 11/08/2021  This Visit's Progress: On track  Note:   Pharmacist Clinical Goal(s):  Over the next 365 days, patient will verbalize ability to afford treatment regimen through collaboration with PharmD and provider.   Interventions: 1:1 collaboration with [name], PA regarding development and update of comprehensive plan of care as evidenced by provider attestation and co-signature Inter-disciplinary care team collaboration (see longitudinal plan of care) Comprehensive medication review performed; medication list updated in electronic medical record  Hypertension (BP goal <130/80) -controlled -Current treatment: Metoprolol succinate 25 mg once daily  Ranolazine 500  mg every 12 hours  -Medications previously tried: ramipril 5 mg once daily  -Current home readings: 100-110-60s  -Current dietary habits: low sodium -Current exercise habits: [employer] deep water aerobics multiple times per week.  -Denies  hypotensive/hypertensive symptoms -Educated on BP goals and benefits of medications for prevention of heart attack, stroke and kidney damage; Daily salt intake goal < 2300 mg; Exercise goal of 150 minutes per week; Importance of home blood pressure monitoring; -Counseled to monitor BP at home at least once every 1-2 weeks, document, and provide log at future appointments -Counseled on diet and exercise extensively Recommended to continue current medication Counseled on signs of low blood pressure  Hyperlipidemia: (LDL goal < 70) -controlled -Current treatment: Repatha 140 mg injection every 2 weeks -Medications previously tried: statin intolerant/allergy  -Current dietary patterns: see diabetes -Current exercise habits: see diabetes -Educated on Cholesterol goals;  -Counseled on diet and exercise extensively Recommended to continue current medication  Diabetes (A1c goal <7%) -controlled -Current medications: n/a -Medications previously tried: metformin  -Current home glucose readings fasting glucose: n/a post prandial glucose: n/a -Denies hypoglycemic/hyperglycemic symptoms -Current meal patterns:  breakfast: cereal, maybe egg, small piece of bacon, oatmeal, pancakes  lunch: toss salad  dinner: minimal sandwiches. Fish, tuna. Green vegetables, raw fruits. snacks: dry roasted peanuts, minimal chips  drinks: no coffee, apple cider or orange juice -Current exercise: [employer] deep [employer] aer[employer] three times -Educated onA1c and blood sugar goals; Complications of diabetes including kidney damage, retinal damage, and cardiovascular disease; Exercise goal of 150 minutes per week; -Counseled to check feet daily and get yearly eye exams -Counseled on  diet and exercise extensively  Patient Goals/Self-Care Activities Over the next 365 days, patient will:  - take medications as prescribed check blood pressure once every 1-2 weeks, document, and provide at future appointments  Follow Up Plan: Telephone follow up appointment with care management team member scheduled for: 05/2021      Patient verbalizes understanding of instructions provided today and agrees to view in [place].  Telephone follow up appointment with pharmacy team member scheduled for: 6 months  Edythe Clarity, [place]

## 2021-05-10 NOTE — Progress Notes (Addendum)
Chronic Care Management Pharmacy Note  05/10/2021 Name:  Nicholas Maxwell MRN:  [id number] DOB:  10-09-1945  Subjective: Nicholas Maxwell is an 75 y.o. year old male who is a primary patient of Nicholas Maxwell, [place].  The CCM team was consulted for assistance with disease management and care coordination needs.    Engaged with patient by telephone for follow up visit in response to provider referral for pharmacy case management and/or care coordination services.   Consent to Services:  The patient was given the following information about Chronic Care Management services today, agreed to services, and gave verbal consent: 1. CCM service includes personalized support from designated clinical staff supervised by the primary care provider, including individualized plan of care and coordination with other care providers 2. 24/7 contact phone numbers for assistance for urgent and routine care needs. 3. Service will only be billed when office clinical staff spend 20 minutes or more in a month to coordinate care. 4. Only one practitioner may furnish and bill the service in a calendar month. 5.The patient may stop CCM services at any time (effective at the end of the month) by phone call to the office staff. 6. The patient will be responsible for cost sharing (co-pay) of up to 20% of the service fee (after annual deductible is met). Patient agreed to services and consent obtained.  Patient Care Team: Nicholas Maxwell, [place] as PCP - General (Physician Assistant) [name], MD as Attending Physician (Urology) Thalia Bloodgood, OD as Referring Physician (Optometry) Newell Coral., MD as Referring Physician (Gastroenterology) Nigel Mormon, MD as Consulting Physician (Cardiology) Lenard Simmer, [facility] (Gastroenterology) Paralee Cancel, MD as Consulting Physician (Orthopedic Surgery) [name], MD as Attending Physician (Urology) Madelin Rear, [facility] as Pharmacist (Pharmacist)  Recent  office visits:  12/06/20- Nicholas Coke, PA- seen for 3 month f/u for dm and htn, labs ordered, no medication changes, no follow up documented   Recent consult visits:  No visits noted   Hospital visits:  None in previous 6 months  Lab Results  Component Value Date   CREATININE 1.54 (H) 12/07/2020   BUN 30 (H) 12/07/2020   GFR 44.38 (L) 12/07/2020   GFRNONAA 58 (L) 10/21/2019   GFRAA >60 10/21/2019   NA 137 12/07/2020   K 4.8 12/07/2020   CALCIUM 9.6 12/07/2020   CO2 28 12/07/2020    Lab Results  Component Value Date/Time   HGBA1C 6.6 (H) 12/07/2020 09:15 AM   HGBA1C 6.1 (H) 09/07/2020 01:42 PM   HGBA1C 5.3 08/21/2018 12:00 AM   GFR 44.38 (L) 12/07/2020 09:15 AM   GFR 47.70 (L) 03/03/2020 09:04 AM    Last diabetic Eye exam:  Lab Results  Component Value Date/Time   HMDIABEYEEXA No Retinopathy 04/25/2021 12:00 AM    Last diabetic Foot exam: No results found for: HMDIABFOOTEX   Lab Results  Component Value Date   CHOL 132 09/07/2020   HDL 45 09/07/2020   LDLCALC 57 09/07/2020   LDLDIRECT 59.0 05/28/2019   TRIG 237 (H) 09/07/2020   CHOLHDL 2.9 09/07/2020    Hepatic Function Latest Ref Rng & Units 09/07/2020 10/08/2019 08/21/2018  Total Protein 6.1 - 8.1 g/dL 6.7 6.6 -  Albumin 3.5 - 5.2 g/dL - 3.9 -  AST 10 - 35 U/L _0 ALT 9 - 46 U/L _1 Alk Phosphatase 39 - 117 U/L - 60 61  Total Bilirubin 0.2 - 1.2 mg/dL 0.5 0.5 -  Lab Results  Component Value Date/Time   TSH 2.05 08/21/2018 12:00 AM    CBC Latest Ref Rng & Units 09/07/2020 02/06/2020 01/21/2020  WBC 3.8 - 10.8 Thousand/uL 5.6 5.6 6.2  Hemoglobin 13.2 - 17.1 g/dL 12.1(L) 12.8(L) 12.4(L)  Hematocrit 38.5 - 50.0 % 34.6(L) 37.8(L) 37.0(L)  Platelets 140 - 400 Thousand/uL 218 215.0 243.0    No results found for: VD25OH  Clinical ASCVD: Yes  The ASCVD Risk score Mikey Bussing DC Jr., et al., 2013) failed to calculate for the following reasons:   The patient has a prior MI or stroke diagnosis     Depression screen [facility] 2/9 11/15/2020 09/07/2020 08/25/2019  Decreased Interest 0 0 0  Down, Depressed, Hopeless 0 0 0  PHQ - 2 Score 0 0 0   Social History   Tobacco Use  Smoking Status Former   Packs/day: 1.00   Years: 30.00   Pack years: 30.00   Types: Cigarettes   Quit date: 2003   Years since quitting: 19.6  Smokeless Tobacco Never   BP Readings from Last 3 Encounters:  12/06/20 120/70  11/15/20 120/66  09/07/20 110/60   Pulse Readings from Last 3 Encounters:  12/06/20 (!) 56  11/15/20 66  09/07/20 (!) 57   Wt Readings from Last 3 Encounters:  12/06/20 205 lb (93 kg)  11/15/20 205 lb 12.8 oz (93.4 kg)  09/07/20 201 lb 4 oz (91.3 kg)   Assessment/Interventions: Review of patient past medical history, allergies, medications, health status, including review of consultants reports, laboratory and other test data, was performed as part of comprehensive evaluation and provision of chronic care management services.   SDOH:  (Social Determinants of Health) assessments and interventions performed:   CCM Care Plan  Allergies  Allergen Reactions   Crestor [Rosuvastatin Calcium] Anaphylaxis and Swelling    Tongue swells/throat closes   Statins Other (See Comments)    Muscle/joint pain with ALL other statin drug but Crestor is severe   Medications Reviewed Today     Reviewed by Edythe Clarity, [facility] (Pharmacist) on 05/10/21 at 1418  Med List Status: <None>   Medication Order Taking? Sig Documenting Provider Last Dose Status Informant  alfuzosin (UROXATRAL) 10 MG 24 hr tablet [id number] Yes Take 10 mg by mouth daily with supper. [provider] Taking Active Self  allopurinol (ZYLOPRIM) 300 MG tablet [id number] Yes TAKE 1 TABLET (300 MG TOTAL) BY MOUTH DAILY WITH LUNCH. Nicholas Maxwell, [place] Taking Active   amoxicillin (AMOXIL) 500 MG capsule [id number] Yes TAKE 4 CAPSULES BY MOUTH 1 HOUR PRIOR TO DENTAL WORK [provider] Taking Active   ASPIRIN 81 81  MG chewable tablet [id number] Yes CHEW 1 TABLET(S) TWICE A DAY BY ORAL ROUTE FOR 4 WEEKS FOR 30 DAYS. [provider] Taking Active   Calcium Carb-Cholecalciferol (CALCIUM 600+D3 PO) [id number] Yes Take by mouth. [provider] Taking Active   Cyanocobalamin (B-12) 2500 MCG TABS [id number] Yes Take 1 tablet by mouth. [provider] Taking Active   metoprolol succinate (TOPROL-XL) 25 MG 24 hr tablet [id number] Yes TAKE 1 TABLET BY MOUTH EVERY DAY Patwardhan, Manish J, MD Taking Active   nitroGLYCERIN (NITROSTAT) 0.4 MG SL tablet [id number] Yes Place 1 tablet (0.4 mg total) under the tongue every 5 (five) minutes x 3 doses as needed for chest pain. Patwardhan, Reynold Bowen, MD Taking Active   Potassium Citrate 15 MEQ (1620 MG) TBCR [id number] Yes Take 2 tablets by mouth 2 (two) times daily. [provider] Taking  Active   ranolazine (RANEXA) 500 MG 12 hr tablet [id number] Yes TAKE 2 TABLETS BY MOUTH TWICE A DAY Patwardhan, Reynold Bowen, MD Taking Active   REPATHA SURECLICK 607 MG/ML Darden Palmer [id number] Yes INJECT 1ML EVERY 2 WEEKS Patwardhan, Reynold Bowen, MD Taking Active            Patient Active Problem List   Diagnosis Date Noted   Chronic kidney disease, stage 3b ([place]) 07/19/2020   Malignant neoplasm of prostate ([place]) 06/11/2020   S/P right TKA 10/23/2019   COVID-19 09/07/2019   Statin intolerance 11/21/2018   Coronary artery disease without angina pectoris 02/26/2018   History of kidney stones 02/26/2018   Osteoarthritis of right knee 02/26/2018   Post PTCA 05/24/2017   Myocardial infarction ([place]) 04/2017   Polyp of colon, adenomatous 12/08/2016   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 11/26/2016   Essential hypertension 05/12/2015   Gouty arthropathy 05/12/2015   Hypertrophic and atrophic condition of skin 05/12/2015   Hyperlipidemia 05/12/2015   Other seborrheic keratosis 05/12/2015   Type 2 diabetes mellitus without complication, without long-term  current use of insulin ([employer]) 05/12/2015   Hypertrophy of prostate without urinary obstruction and other lower urinary tract symptoms (LUTS) 04/08/2013    Immunization History  Administered Date(s) Administered   Fluad Quad(high Dose 65+) 05/28/2019, 06/08/2020   Influenza, High Dose Seasonal PF 07/10/2016, 06/27/2017, 06/11/2018   Influenza-Unspecified 07/05/2010, 07/17/2011, 07/08/2012, 06/24/2013, 07/21/2014, 06/28/2015, 07/10/2016, 06/27/2017   Pneumococcal Conjugate-13 11/09/2016   Pneumococcal Polysaccharide-23 10/07/2012   Tdap 02/22/2018   Zoster, Live 05/16/2013   Conditions to be addressed/monitored:  CAD, HTN, HLD and DMII  Care Plan : CHL AMB "PATIENT-SPECIFIC PROBLEM"  Updates made by Edythe Clarity, RPH since 05/10/2021 12:00 AM     Problem: CAD, HTN, HLD and DMII   Priority: High  Note:   Pharmacist Clinical Goal(s):  Over the next 365 days, patient will verbalize ability to afford treatment regimen through collaboration with PharmD and provider.   Interventions: 1:1 collaboration with Nicholas Coke, PA regarding development and update of comprehensive plan of care as evidenced by provider attestation and co-signature Inter-disciplinary care team collaboration (see longitudinal plan of care) Comprehensive medication review performed; medication list updated in electronic medical record  Hypertension (BP goal <130/80) -controlled -Current treatment: Metoprolol succinate 25 mg once daily  Ranolazine 500 mg every 12 hours  -Medications previously tried: ramipril 5 mg once daily  -Current home readings: 100-110-60s  -Current dietary habits: low sodium -Current exercise habits: [employer] deep water aerobics multiple times per week.  -Denies hypotensive/hypertensive symptoms -Educated on BP goals and benefits of medications for prevention of heart attack, stroke and kidney damage; Daily salt intake goal < 2300 mg; Exercise goal of 150 minutes per week; Importance  of home blood pressure monitoring; -Counseled to monitor BP at home at least once every 1-2 weeks, document, and provide log at future appointments -Counseled on diet and exercise extensively Recommended to continue current medication Counseled on signs of low blood pressure  Hyperlipidemia: (LDL goal < 70) -controlled -Current treatment: Repatha 140 mg injection every 2 weeks -Medications previously tried: statin intolerant/allergy  -Current dietary patterns: see diabetes -Current exercise habits: see diabetes -Educated on Cholesterol goals;  -Counseled on diet and exercise extensively Recommended to continue current medication  Diabetes (A1c goal <7%) -controlled -Current medications: n/a -Medications previously tried: metformin  -Current home glucose readings fasting glucose: n/a post prandial glucose: n/a -Denies hypoglycemic/hyperglycemic symptoms -Current meal patterns:  breakfast: cereal,  maybe egg, small piece of bacon, oatmeal, pancakes  lunch: toss salad  dinner: minimal sandwiches. Fish, tuna. Green vegetables, raw fruits. snacks: dry roasted peanuts, minimal chips  drinks: no coffee, apple cider or orange juice -Current exercise: [employer] deep water aerob[employer] three times -Educated onA1c and blood sugar goals; Complications of diabetes including kidney damage, retinal damage, and cardiovascular disease; Exercise goal of 150 minutes per week; -Counseled to check feet daily and get yearly eye exams -Counseled on diet and exercise extensively  Patient Goals/Self-Care Activities Over the next 365 days, patient will:  - take medications as prescribed check blood pressure once every 1-2 weeks, document, and provide at future appointments  Follow Up Plan: Telephone follow up appointment with care management team member scheduled for: 05/2021    Long-Range Goal: Patient Stated   Start Date: 11/08/2020  Expected End Date: 11/08/2021  Recent Progress: On track  Note:    Pharmacist Clinical Goal(s):  Over the next 365 days, patient will verbalize ability to afford treatment regimen through collaboration with PharmD and provider.   Interventions: 1:1 collaboration with Nicholas Coke, PA regarding development and update of comprehensive plan of care as evidenced by provider attestation and co-signature Inter-disciplinary care team collaboration (see longitudinal plan of care) Comprehensive medication review performed; medication list updated in electronic medical record  Hypertension (BP goal <130/80) -controlled -Current treatment: Metoprolol succinate 25 mg once daily  Ranolazine 500 mg every 12 hours  -Medications previously tried: ramipril 5 mg once daily  -Current home readings: 100-110-60s  -Current dietary habits: low sodium -Current exercise habits: [employer] deep water aerobics multiple times per week.  -Denies hypotensive/hypertensive symptoms -Educated on BP goals and benefits of medications for prevention of heart attack, stroke and kidney damage; Daily salt intake goal < 2300 mg; Exercise goal of 150 minutes per week; Importance of home blood pressure monitoring; -Counseled to monitor BP at home at least once every 1-2 weeks, document, and provide log at future appointments -Counseled on diet and exercise extensively Recommended to continue current medication Counseled on signs of low blood pressure  Update 05/10/21 113/66 BP reading at home recently Denies any dizziness or HA's - continue current exercise  Continue current meds for now  Hyperlipidemia: (LDL goal < 70) -controlled -Current treatment: Repatha 140 mg injection every 2 weeks -Medications previously tried: statin intolerant/allergy  -Current dietary patterns: see diabetes -Current exercise habits: see diabetes -Educated on Cholesterol goals;  -Counseled on diet and exercise extensively Recommended to continue current medication  Update 05/10/21 Patient reports he  is in the donut hole which has caused his Repatha to go up about $30 per month.  He is still able to afford it and reports good adherence.  Was not able to apply for assistance due to income once he got tax information back from last year.  Will re-evaluate next year to see if he is able to qualify for the Calcium program through [employer].  Continue current meds - LDL looks excellent  Diabetes (A1c goal <7%) -controlled -Current medications: n/a -Medications previously tried: metformin  -Current home glucose readings fasting glucose: n/a post prandial glucose: n/a -Denies hypoglycemic/hyperglycemic symptoms -Current meal patterns:  breakfast: cereal, maybe egg, small piece of bacon, oatmeal, pancakes  lunch: toss salad  dinner: minimal sandwiches. Fish, tuna. Green vegetables, raw fruits. snacks: dry roasted peanuts, minimal chips  drinks: no coffee, apple cider or orange juice -Current exercise: [employer] deep [employer] aerob[employer] three times -Educated onA1c and blood sugar goals; Complications of  diabetes including kidney damage, retinal damage, and cardiovascular disease; Exercise goal of 150 minutes per week; -Counseled to check feet daily and get yearly eye exams -Counseled on diet and exercise extensively  Patient Goals/Self-Care Activities Over the next 365 days, patient will:  - take medications as prescribed check blood pressure once every 1-2 weeks, document, and provide at future appointments  Follow Up Plan: Telephone follow up appointment with care management team member scheduled for: 6 months      Medication Assistance: Patient in donut hole does not qualify for PAP due to income.  Will retry next year.  Patient's preferred pharmacy is:  [id number] [place], [place] - [address] [place] [id number]: [phone]: [phone] Uses pill box? Yes Pt endorses 100% compliance  We  discussed: Benefits of medication synchronization, packaging and delivery as well as enhanced pharmacist oversight with [employer]. Patient decided to: Continue current medication management strategy  Follow Up:  Patient agrees to Care Plan and Follow-up.  Plan: 6 month follow up   Future Appointments  Date Time Provider DLee 06/14/2021  9:00 AM [facility]Inda Maxwell [facility] 06/24/2021 10:30 AM Patwardhan, MReynold Bowen MD PCV-PCV None  11/21/2021  1:00 PM LB[facility]-HPC HEALTH COACH [facility] PEC       Encounter details: CCM Time Spent       Value Time User   Time spent with patient (minutes)  32 05/10/2021  2:27 PM [facility]  Time spent performing Chart review  11 05/10/2021  2:27 PM DEdythe Clarity [facility]  Total time (minutes)  43 05/10/2021  2:27 PM DRosana Hoes CDarrick MeigsL, RPH      Moderate to High Complex Decision Making     None      CCM Services: This encounter meets routine CCM services.  Prior to outreach and patient consent for Chronic Care Management, I referred this patient for services after reviewing the nominated patient list or from a personal encounter with the patient.  I have personally reviewed this encounter including the documentation in this note and have collaborated with the care management provider regarding care management and care coordination activities to include development and update of the comprehensive care plan. I am certifying that I agree with the content of this note and encounter as supervising physician.

## 2021-05-15 ENCOUNTER — Other Ambulatory Visit: Payer: Self-pay | Admitting: Cardiology

## 2021-05-15 DIAGNOSIS — I2510 Atherosclerotic heart disease of native coronary artery without angina pectoris: Secondary | ICD-10-CM

## 2021-06-07 DIAGNOSIS — C61 Malignant neoplasm of prostate: Secondary | ICD-10-CM | POA: Diagnosis not present

## 2021-06-07 LAB — TESTOSTERONE: Testosterone: 11.9

## 2021-06-07 LAB — PSA: PSA: 0.039

## 2021-06-10 DIAGNOSIS — Z789 Other specified health status: Secondary | ICD-10-CM

## 2021-06-10 NOTE — Progress Notes (Signed)
[facility])                                            [place] Team                                        Statin Quality Measure Assessment    06/10/2021  Nicholas Maxwell 29-Jun-1946 [id number]  Per review of chart and payor information, this patient has been flagged for non-adherence to the following CMS Quality Measure:   '[]'$  Statin Use in Persons with Diabetes  '[x]'$  Statin Use in Persons with Cardiovascular Disease  The ASCVD Risk score (Arnett DK, et al., 2019) failed to calculate for the following reasons:   The patient has a prior MI or stroke diagnosis  Currently prescribed statin:  '[]'$  Yes '[x]'$  No     Comments: N/A  History of statin use:            '[x]'$  Yes '[]'$  No   Comments: Per prior documentation statins caused muscle/joint pain. He has Repatha on file.   Please consider the following recommendations:  If deemed therapeutically appropriate, please consider associating G72.0 (drug induced myopathy), M60.9 (myositis), or M79.1 (myalgia) to the next encounter to close the CMS gap.     Thank you for your time,  Kristeen Miss, [place] Cell: [phone]

## 2021-06-13 DIAGNOSIS — C61 Malignant neoplasm of prostate: Secondary | ICD-10-CM | POA: Diagnosis not present

## 2021-06-13 DIAGNOSIS — R311 Benign essential microscopic hematuria: Secondary | ICD-10-CM | POA: Diagnosis not present

## 2021-06-13 DIAGNOSIS — R351 Nocturia: Secondary | ICD-10-CM | POA: Diagnosis not present

## 2021-06-13 DIAGNOSIS — N403 Nodular prostate with lower urinary tract symptoms: Secondary | ICD-10-CM | POA: Diagnosis not present

## 2021-06-13 DIAGNOSIS — E349 Endocrine disorder, unspecified: Secondary | ICD-10-CM | POA: Diagnosis not present

## 2021-06-14 ENCOUNTER — Ambulatory Visit (INDEPENDENT_AMBULATORY_CARE_PROVIDER_SITE_OTHER): Payer: PPO | Admitting: Physician Assistant

## 2021-06-14 ENCOUNTER — Encounter: Payer: Self-pay | Admitting: Physician Assistant

## 2021-06-14 ENCOUNTER — Other Ambulatory Visit: Payer: Self-pay

## 2021-06-14 VITALS — BP 110/60 | HR 52 | Temp 97.4°F | Ht 69.0 in | Wt 200.5 lb

## 2021-06-14 DIAGNOSIS — E119 Type 2 diabetes mellitus without complications: Secondary | ICD-10-CM

## 2021-06-14 DIAGNOSIS — Z23 Encounter for immunization: Secondary | ICD-10-CM

## 2021-06-14 DIAGNOSIS — M791 Myalgia, unspecified site: Secondary | ICD-10-CM

## 2021-06-14 DIAGNOSIS — N1832 Chronic kidney disease, stage 3b: Secondary | ICD-10-CM

## 2021-06-14 DIAGNOSIS — T466X5A Adverse effect of antihyperlipidemic and antiarteriosclerotic drugs, initial encounter: Secondary | ICD-10-CM

## 2021-06-14 LAB — BASIC METABOLIC PANEL
BUN: 31 mg/dL — ABNORMAL HIGH (ref 6–23)
CO2: 27 mEq/L (ref 19–32)
Calcium: 9.9 mg/dL (ref 8.4–10.5)
Chloride: 100 mEq/L (ref 96–112)
Creatinine, Ser: 1.57 mg/dL — ABNORMAL HIGH (ref 0.40–1.50)
GFR: 43.21 mL/min — ABNORMAL LOW (ref >60.00)
Glucose, Bld: 120 mg/dL — ABNORMAL HIGH (ref 70–99)
Potassium: 4.7 mEq/L (ref 3.5–5.1)
Sodium: 136 mEq/L (ref 135–145)

## 2021-06-14 LAB — MICROALBUMIN / CREATININE URINE RATIO
Creatinine,U: 99.3 mg/dL
Microalb Creat Ratio: 0.7 mg/g (ref 0.0–30.0)
Microalb, Ur: <0.7 mg/dL (ref 0.0–1.9)

## 2021-06-14 LAB — HEMOGLOBIN A1C: Hgb A1c MFr Bld: 6 % (ref 4.6–6.5)

## 2021-06-14 NOTE — Patient Instructions (Signed)
It was great to see you!  Keep up the good work  We will be in touch with your lab results.  Take care,  [name] PA-C

## 2021-06-14 NOTE — Progress Notes (Signed)
Nicholas Maxwell is a 75 y.o. male is here for follow up.  I acted as a Education administrator for [employer], PA-C Anselmo Pickler, LPN   History of Present Illness:   Chief Complaint  Patient presents with   Diabetes     HPI  Diabetes Pt here for 6 month f/u. Currently no taking any medications. He is checking blood sugars averaging 130-140. Denies signs of hypoglycemia or hyperglycemia. Denies neuropathy. Working out regularly at [employer].  Wt Readings from Last 3 Encounters:  06/14/21 200 lb 8 oz (90.9 kg)  12/06/20 205 lb (93 kg)  11/15/20 205 lb 12.8 oz (93.4 kg)   CKD Currently taking 30 mEq potassium citrate twice daily. Seeing Dr. Joylene Grapes at [facility]. Note reviewed -- he does state that if K consistently greater than 5.5 that we can consider decreasing supplement.  Myalgia due to statin Cannot tolerate statins due to myalgia. Taking repatha however very expensive.  Health Maintenance Due  Topic Date Due   URINE MICROALBUMIN  Never done   INFLUENZA VACCINE  05/02/2021   HEMOGLOBIN A1C  06/09/2021    Past Medical History:  Diagnosis Date   History of kidney stones    Myocardial infarction ([place]) 04/2017   Prostate cancer ([place]) 05/2020   Statin intolerance    Used >2 different statins     Social History   Tobacco Use   Smoking status: Former    Packs/day: 1.00    Years: 30.00    Pack years: 30.00    Types: Cigarettes    Quit date: 2003    Years since quitting: 19.7   Smokeless tobacco: Never  Vaping Use   Vaping Use: Never used  Substance Use Topics   Alcohol use: Not Currently    Comment: has drank since 2014   Drug use: No    Past Surgical History:  Procedure Laterality Date   COLONOSCOPY     March of 2018    CORONARY ANGIOPLASTY WITH STENT PLACEMENT  05/24/2017   CORONARY STENT INTERVENTION N/A 05/24/2017   Procedure: CORONARY STENT INTERVENTION;  Surgeon: Nigel Mormon, MD;  Location: [place] [facility];  Service: Cardiovascular;   Laterality: N/A;  MID Circumflex 4.0x22 Onyx MID LAD 2.75x15 Onyx   CYSTOSCOPY WITH URETEROSCOPY, STONE BASKETRY AND STENT PLACEMENT  ~ [place] PRESSURE WIRE/FFR STUDY N/A 05/24/2017   Procedure: INTRAVASCULAR PRESSURE WIRE/FFR STUDY;  Surgeon: Nigel Mormon, MD;  Location: [place] [facility];  Service: Cardiovascular;  Laterality: N/A;  MID LAD   KNEE ARTHROSCOPY Right 2016   LEFT HEART CATH AND CORONARY ANGIOGRAPHY N/A 05/24/2017   Procedure: LEFT HEART CATH AND CORONARY ANGIOGRAPHY;  Surgeon: Nigel Mormon, MD;  Location: [place] [facility];  Service: Cardiovascular;  Laterality: N/A;   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   Procedure: TOTAL KNEE ARTHROPLASTY;  Surgeon: Paralee Cancel, MD;  Location: [facility];  Service: Orthopedics;  Laterality: Right;  70 mins   UMBILICAL HERNIA REPAIR  1992   UPPER GASTROINTESTINAL ENDOSCOPY      Family History  Problem Relation Age of Onset   Transient ischemic attack Father 30   Bone cancer Father    CAD Mother 5       CABG   Diabetes Mother    Breast cancer Neg Hx    Colon cancer Neg Hx    Pancreatic cancer Neg Hx    Prostate cancer Neg Hx  PMHx, SurgHx, SocialHx, FamHx, Medications, and Allergies were reviewed in the Visit Navigator and updated as appropriate.   Patient Active Problem List   Diagnosis Date Noted   Chronic kidney disease, stage 3b ([place]) 07/19/2020   Malignant neoplasm of prostate ([place]) 06/11/2020   S/P right TKA 10/23/2019   COVID-19 09/07/2019   Statin intolerance 11/21/2018   Coronary artery disease without angina pectoris 02/26/2018   History of kidney stones 02/26/2018   Osteoarthritis of right knee 02/26/2018   Post PTCA 05/24/2017   Myocardial infarction ([place]) 04/2017   Polyp of colon, adenomatous 12/08/2016   Arthritis of carpometacarpal [facility]) joint of left thumb 11/26/2016   Essential hypertension 05/12/2015   Gouty arthropathy 05/12/2015   Hypertrophic and  atrophic condition of skin 05/12/2015   Hyperlipidemia 05/12/2015   Other seborrheic keratosis 05/12/2015   Type 2 diabetes mellitus without complication, without long-term current use of insulin ([place]) 05/12/2015   Hypertrophy of prostate without urinary obstruction and other lower urinary tract symptoms (LUTS) 04/08/2013    Social History   Tobacco Use   Smoking status: Former    Packs/day: 1.00    Years: 30.00    Pack years: 30.00    Types: Cigarettes    Quit date: 2003    Years since quitting: 19.7   Smokeless tobacco: Never  Vaping Use   Vaping Use: Never used  Substance Use Topics   Alcohol use: Not Currently    Comment: has drank since 2014   Drug use: No    Current Medications and Allergies:    Current Outpatient Medications:    alfuzosin (UROXATRAL) 10 MG 24 hr tablet, Take 10 mg by mouth daily with supper., Disp: , Rfl:    allopurinol (ZYLOPRIM) 300 MG tablet, TAKE 1 TABLET (300 MG TOTAL) BY MOUTH DAILY WITH LUNCH., Disp: 90 tablet, Rfl: 1   amoxicillin (AMOXIL) 500 MG capsule, TAKE 4 CAPSULES BY MOUTH 1 HOUR PRIOR TO DENTAL WORK, Disp: , Rfl:    ASPIRIN 81 81 MG chewable tablet, Chew 81 mg by mouth daily., Disp: , Rfl:    Calcium Carb-Cholecalciferol (CALCIUM 600+D3 PO), Take by mouth., Disp: , Rfl:    Cyanocobalamin (B-12) 2500 MCG TABS, Take 1 tablet by mouth., Disp: , Rfl:    metoprolol succinate (TOPROL-XL) 25 MG 24 hr tablet, TAKE 1 TABLET BY MOUTH EVERY DAY, Disp: 90 tablet, Rfl: 0   nitroGLYCERIN (NITROSTAT) 0.4 MG SL tablet, Place 1 tablet (0.4 mg total) under the tongue every 5 (five) minutes x 3 doses as needed for chest pain., Disp: 30 tablet, Rfl: 2   Potassium Citrate 15 MEQ (1620 MG) TBCR, Take 2 tablets by mouth 2 (two) times daily., Disp: , Rfl:    ranolazine (RANEXA) 500 MG 12 hr tablet, TAKE 2 TABLETS BY MOUTH TWICE A DAY, Disp: 360 tablet, Rfl: 0   REPATHA SURECLICK [id number] MG/ML SOAJ, INJECT 1ML EVERY 2 WEEKS, Disp: 2 mL, Rfl: 3   Allergies   Allergen Reactions   Crestor [Rosuvastatin Calcium] Anaphylaxis and Swelling    Tongue swells/throat closes   Statins Other (See Comments)    Muscle/joint pain with ALL other statin drug but Crestor is severe    Review of Systems   ROS Negative unless otherwise specified per HPI.  Vitals:   Vitals:   06/14/21 0902  BP: 110/60  Pulse: (!) 52  Temp: (!) 97.4 F (36.3 C)  TempSrc: Temporal  SpO2: 97%  Weight: 200 lb 8 oz (90.9 kg)  Height: 5'  9" (1.753 m)     Body mass index is 29.61 kg/m.   Physical Exam:    Physical Exam Vitals and nursing note reviewed.  Constitutional:      General: He is not in acute distress.    Appearance: He is well-developed. He is not ill-appearing or toxic-appearing.  Cardiovascular:     Rate and Rhythm: Normal rate and regular rhythm.     Pulses: Normal pulses.     Heart sounds: Normal heart sounds, S1 normal and S2 normal.     Comments: No LE edema Pulmonary:     Effort: Pulmonary effort is normal.     Breath sounds: Normal breath sounds.  Skin:    General: Skin is warm and dry.  Neurological:     Mental Status: He is alert.     GCS: GCS eye subscore is 4. GCS verbal subscore is 5. GCS motor subscore is 6.  Psychiatric:        Speech: Speech normal.        Behavior: Behavior normal. Behavior is cooperative.     Assessment and Plan:   1. Type 2 diabetes mellitus without complication, without long-term current use of insulin (HCC) Update A1c  Suspect good control  Consider addition of medication if indicated however patient would like to avoid additional medications Commended efforts  2. Chronic kidney disease, stage 3b ([place]) Update K Send results to Dr Joylene Grapes Further mgmt per renal  3. Myalgia due to statin Compliant with repatha  CMA or LPN served as scribe during this visit. History, Physical, and Plan performed by medical provider. The above documentation has been reviewed and is accurate and complete.  [name], PA-C [place], [place] 06/14/2021  Follow-up: No follow-ups on file.

## 2021-06-17 ENCOUNTER — Other Ambulatory Visit: Payer: Self-pay | Admitting: *Deleted

## 2021-06-17 MED ORDER — ACCU-CHEK AVIVA PLUS VI STRP: ORAL_STRIP | 12 refills | Status: DC

## 2021-06-22 ENCOUNTER — Encounter: Payer: Self-pay | Admitting: Physician Assistant

## 2021-06-24 ENCOUNTER — Ambulatory Visit: Payer: PPO | Admitting: Cardiology

## 2021-06-24 ENCOUNTER — Other Ambulatory Visit: Payer: Self-pay

## 2021-06-24 ENCOUNTER — Encounter: Payer: Self-pay | Admitting: Cardiology

## 2021-06-24 VITALS — BP 115/64 | HR 56 | Temp 98.3°F | Resp 16 | Ht 70.0 in | Wt 202.0 lb

## 2021-06-24 DIAGNOSIS — E782 Mixed hyperlipidemia: Secondary | ICD-10-CM

## 2021-06-24 DIAGNOSIS — I10 Essential (primary) hypertension: Secondary | ICD-10-CM | POA: Diagnosis not present

## 2021-06-24 DIAGNOSIS — E7800 Pure hypercholesterolemia, unspecified: Secondary | ICD-10-CM | POA: Diagnosis not present

## 2021-06-24 DIAGNOSIS — I2510 Atherosclerotic heart disease of native coronary artery without angina pectoris: Secondary | ICD-10-CM

## 2021-06-24 NOTE — Progress Notes (Signed)
Patient is here for follow up visit.  Subjective:   Nicholas Maxwell, male    DOB: [birth year], 75 y.o.   MRN: [id number]  Chief Complaint  Patient presents with   Coronary artery disease involving native coronary artery of   Follow-up   75 year old pleasant Caucasian male with hypertension, hyperlipidemia, CAD s/p LAD and Lcx PCI 2018, hypertension, type 2 DM, CKD 3, h/o prostate cancer  Patient is doing well, denies chest pain, shortness of breath, palpitations, leg edema, orthopnea, PND, TIA/syncope.  He underwent radiation treatment for prostate cancer in late 2021, now is in remission.  He is staying active with walking, swimming, biking without any complaints of chest pain or shortness of breath.  Blood pressures well controlled.  He has not had any recent lipid panel checked.  Current Outpatient Medications on File Prior to Visit  Medication Sig Dispense Refill   alfuzosin (UROXATRAL) 10 MG 24 hr tablet Take 10 mg by mouth daily with supper.     allopurinol (ZYLOPRIM) 300 MG tablet TAKE 1 TABLET (300 MG TOTAL) BY MOUTH DAILY WITH LUNCH. 90 tablet 1   amoxicillin (AMOXIL) 500 MG capsule TAKE 4 CAPSULES BY MOUTH 1 HOUR PRIOR TO DENTAL WORK     ASPIRIN 81 81 MG chewable tablet Chew 81 mg by mouth daily.     Calcium Carb-Cholecalciferol (CALCIUM 600+D3 PO) Take by mouth.     Cyanocobalamin (B-12) 2500 MCG TABS Take 1 tablet by mouth.     glucose blood (ACCU-CHEK AVIVA PLUS) test strip Use as instructed. E11.9 100 each 12   metoprolol succinate (TOPROL-XL) 25 MG 24 hr tablet TAKE 1 TABLET BY MOUTH EVERY DAY 90 tablet 0   nitroGLYCERIN (NITROSTAT) 0.4 MG SL tablet Place 1 tablet (0.4 mg total) under the tongue every 5 (five) minutes x 3 doses as needed for chest pain. 30 tablet 2   Potassium Citrate 15 MEQ (1620 MG) TBCR Take 2 tablets by mouth 2 (two) times daily.     ranolazine (RANEXA) 500 MG 12 hr tablet TAKE 2 TABLETS BY MOUTH TWICE A DAY 360 tablet 0   REPATHA SURECLICK 035  MG/ML SOAJ INJECT 1ML EVERY 2 WEEKS 2 mL 3   No current facility-administered medications on file prior to visit.    Cardiovascular studies:  EKG 06/24/2021: Sinus rhythm 55 bpm Nonspecific T-abnormality Low voltage precordial leads  Abdominal aortic duplex 09/11/2018: The maximum aorta diameter is 2.53 cm (mid). Diffuse heterogeneous plaque observed in the proximal, mid and distal aorta.  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  Normal flow velocities noted in aorta and iliac arteries. Rec: Rescan in 5 years if clinically indicated.  Cath 05/24/2017: LM: Normal LAD: 60% mid LAD calcific stenosis FFR +ve 0.77. Mild diffuse disease in small caliber distal LAD  Successful complex PTCA and DES placement Onyx 2.75 X 15 mm Mid LAD     (60%-->0%, TIMI III - TIMI III) Ramus intermedius: Small caliber vessel with 50-70% stenoses LCx: Large dominant vessel with mid circumflex subtotal occlusion with retrograde filling of distal circumflex and PDA Successful PTCA and DES placement Onyx 4.0 X 22 mm Mid left circumflex     (99%-->0%, TIMI I - TIMI III) RCA: Small caliber nondominant vessel with mild diffuse disease  Exercise myoview stress 05/18/2017: 1. The patient performed treadmill exercise using a Bruce protocol, completing 8 minutes. The patient completed an estimated workload of 10.12 METS achieving 85%  maximum predicted heart rate. Stress symptoms included 4/10 chest pain and dyspnea.  2. The stress electrocardiogram revealed 1-1.5 mm of horizontal ST depression in lead (s):II, III, aVF, V4, V5, V6 consistent with myocardial ischemia.   3. This is an abnormal myocardial perfusion imaging study demonstrating a mixture of scar plus ischemia in the basal inferoseptal, basal inferior, basal inferolateral, mid inferoseptal, mid inferior, mid inferolateral and apical inferior myocardial wall(s) (LCx/RCA territory) 4. Left ventricular systolic  function was abnormal with regional wall motion abnormalities.  The left ventricular ejection fraction was calculated to be 39%.   5. This is a high risk study  Echocardiogram 05/15/2017: Left ventricle cavity is normal in size. Left ventricle regional wall motion findings: Basal anterolateral, Basal inferolateral, Mid anterolateral and Mid inferolateral hypokinesis. Calculated EF 55%. Structurally normal mitral valve with moderate (Grade II) regurgitation.  Recent labs: 06/14/2021: Glucose 120, BUN/Cr 31/1.57. EGFR 43. Na/K 136/4.7.  HbA1C 6.0%  06/08/2020: Glucose 267, BUN/Cr 32/1.65. Na/K 137/5.0.  HbA1C 5.6%  03/03/2020: Glucose 228, BUN/Cr 28/1.45. Na/K 137/4.5.   05/28/2019: Chol 132, TG 274, HDL 43, LDL 59  2019: TSH 2.0 normal    Review of Systems  Cardiovascular:  Negative for chest pain, dyspnea on exertion, leg swelling, palpitations and syncope.      Objective:   Vitals:   06/24/21 1033  BP: 115/64  Pulse: (!) 56  Resp: 16  Temp: 98.3 F (36.8 C)  SpO2: 98%     Physical Exam Vitals and nursing note reviewed.  Constitutional:      General: He is not in acute distress. Neck:     Vascular: No JVD.  Cardiovascular:     Rate and Rhythm: Normal rate and regular rhythm.     Heart sounds: Normal heart sounds. No murmur heard. Pulmonary:     Effort: Pulmonary effort is normal.     Breath sounds: Normal breath sounds. No wheezing or rales.        Assessment & Recommendations:   75 year old pleasant Caucasian male with hypertension, hyperlipidemia, CAD s/p LAD and Lcx PCI 2018, hypertension, type 2 DM, CKD 3, h/o prostate cancer  CAD: Stable. Continue aspirin, Repatha, metoprolol, Ranexa. Not on ACEi/ARB. Defer to PCP  Mixed hyperlipidemia H/o statin induced myalgia. Well controlled on [place].  Check lipid panel  Essential hypertension Controlled  Type 2 diabetes mellitus without complication, without long-term current use of insulin  ([place]) Followed by PCP.  CKD: Avoid NSAIDS. Encourage hydration. F/u w/PCP  Aortic ectasia: Repeat aorta duplex in 2024  F/u in 1 year.   Nigel Mormon, MD [facility] Cardiovascular. PA Pager: [phone] Office: [phone] If no answer Cell [phone]

## 2021-06-25 ENCOUNTER — Other Ambulatory Visit: Payer: Self-pay | Admitting: Cardiology

## 2021-06-28 DIAGNOSIS — E782 Mixed hyperlipidemia: Secondary | ICD-10-CM | POA: Diagnosis not present

## 2021-06-28 DIAGNOSIS — I2510 Atherosclerotic heart disease of native coronary artery without angina pectoris: Secondary | ICD-10-CM | POA: Diagnosis not present

## 2021-06-29 LAB — LIPID PANEL
Chol/HDL Ratio: 3 ratio (ref 0.0–5.0)
Cholesterol, Total: 128 mg/dL (ref 100–199)
HDL: 43 mg/dL (ref >39)
LDL Chol Calc (NIH): 43 mg/dL (ref 0–99)
Triglycerides: 276 mg/dL — ABNORMAL HIGH (ref 0–149)
VLDL Cholesterol Cal: 42 mg/dL — ABNORMAL HIGH (ref 5–40)

## 2021-06-30 ENCOUNTER — Other Ambulatory Visit: Payer: Self-pay | Admitting: Cardiology

## 2021-07-05 ENCOUNTER — Other Ambulatory Visit: Payer: Self-pay | Admitting: Physician Assistant

## 2021-07-05 ENCOUNTER — Telehealth: Payer: Self-pay

## 2021-07-05 ENCOUNTER — Other Ambulatory Visit: Payer: Self-pay | Admitting: *Deleted

## 2021-07-05 MED ORDER — ONETOUCH ULTRA VI STRP: ORAL_STRIP | 4 refills | Status: AC

## 2021-07-05 NOTE — Telephone Encounter (Signed)
Please send 90 pills X 3 refills of Ranexa 100 mg bid.  Thanks MJP

## 2021-07-06 ENCOUNTER — Other Ambulatory Visit: Payer: Self-pay

## 2021-07-06 MED ORDER — RANOLAZINE ER 1000 MG PO TB12: 1000.0000 mg | ORAL_TABLET | Freq: Two times a day (BID) | ORAL | 3 refills | Status: DC

## 2021-07-06 NOTE — Telephone Encounter (Signed)
Ok I have send the medication to pt pharmacy

## 2021-07-21 DIAGNOSIS — Z96651 Presence of right artificial knee joint: Secondary | ICD-10-CM | POA: Diagnosis not present

## 2021-07-25 ENCOUNTER — Other Ambulatory Visit: Payer: Self-pay | Admitting: Cardiology

## 2021-07-25 ENCOUNTER — Telehealth: Payer: Self-pay | Admitting: Pharmacist

## 2021-07-25 NOTE — Chronic Care Management (AMB) (Signed)
Chronic Care Management Pharmacy Assistant   Name: Nicholas Maxwell  MRN: [id number] DOB: 08/08/1946    Reason for Encounter: Hypertension Adherence Call    Recent office visits:  06/14/2021 [facility] (PCP) [name], PA; ; no medication changes indicated.  Recent consult visits:  06/24/2021 [facility] (cardiology) Allyn Kenner, MD; no medication changes indicated.  Hospital visits:  None in previous 6 months  Medications: Outpatient Encounter Medications as of 07/25/2021  Medication Sig   alfuzosin (UROXATRAL) 10 MG 24 hr tablet Take 10 mg by mouth daily with supper.   allopurinol (ZYLOPRIM) 300 MG tablet TAKE 1 TABLET (300 MG TOTAL) BY MOUTH DAILY WITH LUNCH.   amoxicillin (AMOXIL) 500 MG capsule TAKE 4 CAPSULES BY MOUTH 1 HOUR PRIOR TO DENTAL WORK   ASPIRIN 81 81 MG chewable tablet Chew 81 mg by mouth daily.   Blood Glucose Monitoring Suppl (ONE TOUCH ULTRA 2) w/Device KIT USE TO CHECK BLOOD SUGAR   Calcium Carb-Cholecalciferol (CALCIUM 600+D3 PO) Take by mouth.   Cyanocobalamin (B-12) 2500 MCG TABS Take 1 tablet by mouth.   glucose blood (ONETOUCH ULTRA) test strip USE TO CHECK BLOOD SUGAR DAILY AND PRN   metoprolol succinate (TOPROL-XL) 25 MG 24 hr tablet TAKE 1 TABLET BY MOUTH EVERY DAY   nitroGLYCERIN (NITROSTAT) 0.4 MG SL tablet Place 1 tablet (0.4 mg total) under the tongue every 5 (five) minutes x 3 doses as needed for chest pain.   Potassium Citrate 15 MEQ (1620 MG) TBCR Take 2 tablets by mouth 2 (two) times daily.   ranolazine (RANEXA) 1000 MG SR tablet Take 1 tablet (1,000 mg total) by mouth 2 (two) times daily.   ranolazine (RANEXA) 500 MG 12 hr tablet TAKE 2 TABLETS BY MOUTH TWICE A DAY   REPATHA SURECLICK 503 MG/ML SOAJ INJECT 1ML EVERY 2 WEEKS   No facility-administered encounter medications on file as of 07/25/2021.   Reviewed chart prior to disease state call. Spoke with patient regarding BP  Recent Office Vitals: BP Readings from Last 3 Encounters:   06/24/21 115/64  06/14/21 110/60  12/06/20 120/70   Pulse Readings from Last 3 Encounters:  06/24/21 (!) 56  06/14/21 (!) 52  12/06/20 (!) 56    Wt Readings from Last 3 Encounters:  06/24/21 202 lb (91.6 kg)  06/14/21 200 lb 8 oz (90.9 kg)  12/06/20 205 lb (93 kg)     Kidney Function Lab Results  Component Value Date/Time   CREATININE 1.57 (H) 06/14/2021 09:37 AM   CREATININE 1.54 (H) 12/07/2020 09:15 AM   CREATININE 1.41 (H) 09/07/2020 01:42 PM   CREATININE 1.65 (H) 06/08/2020 09:41 AM   GFR 43.21 (L) 06/14/2021 09:37 AM   GFRNONAA 58 (L) 10/21/2019 11:56 AM   GFRNONAA 49 08/21/2018 12:00 AM   GFRAA >60 10/21/2019 11:56 AM    BMP Latest Ref Rng & Units 06/14/2021 12/07/2020 09/07/2020  Glucose 70 - 99 mg/dL 120(H) 164(H) 95  BUN 6 - 23 mg/dL 31(H) 30(H) 31(H)  Creatinine 0.40 - 1.50 mg/dL 1.57(H) 1.54(H) 1.41(H)  BUN/Creat Ratio 6 - 22 (calc) - - 22  Sodium 135 - 145 mEq/L 136 137 139  Potassium 3.5 - 5.1 mEq/L 4.7 4.8 4.8  Chloride 96 - 112 mEq/L 100 101 103  CO2 19 - 32 mEq/L '27 28 29  ' Calcium 8.4 - 10.5 mg/dL 9.9 9.6 9.5    Current antihypertensive regimen:  How often are you checking your Blood Pressure?  Current home BP readings:  What  recent interventions/DTPs have been made by any provider to improve Blood Pressure control since last CPP Visit:  Any recent hospitalizations or ED visits since last visit with CPP?  What diet changes have been made to improve Blood Pressure Control?   What exercise is being done to improve your Blood Pressure Control?    Adherence Review: Is the patient currently on ACE/ARB medication?  Does the patient have >5 day gap between last estimated fill dates?   **Several unsuccessful attempts at reaching patient to complete this call.**  Care Gaps: Medicare Annual Wellness: Completed Ophthalmology Exam: Next due on 04/25/2022 Foot Exam: Postponed until 12/12/2021 Hemoglobin A1C: 6.0% on 06/14/2021 Colonoscopy: Next due on  12/05/2021   Future Appointments  Date Time Provider [place]  11/21/2021  1:00 PM [facility] HEALTH COACH [facility]  11/22/2021  1:30 PM [facility] CCM PHARMACIST [facility]  06/23/2022 10:30 AM Patwardhan, Reynold Bowen, MD [facility] None    Star Rating Drugs: None  April D Calhoun, [place] Pharmacist Assistant [phone]

## 2021-08-13 ENCOUNTER — Other Ambulatory Visit: Payer: Self-pay | Admitting: Cardiology

## 2021-08-13 DIAGNOSIS — I2510 Atherosclerotic heart disease of native coronary artery without angina pectoris: Secondary | ICD-10-CM

## 2021-08-17 ENCOUNTER — Other Ambulatory Visit: Payer: Self-pay | Admitting: Cardiology

## 2021-08-30 DIAGNOSIS — N1832 Chronic kidney disease, stage 3b: Secondary | ICD-10-CM | POA: Diagnosis not present

## 2021-08-30 LAB — BASIC METABOLIC PANEL
BUN: 22 — AB (ref 4–21)
CO2: 24 — AB (ref 13–22)
Chloride: 100 (ref 99–108)
Creatinine: 1.4 — AB (ref <=1.3)
Glucose: 123
Potassium: 5.1 (ref 3.4–5.3)
Sodium: 138 (ref 137–147)

## 2021-08-30 LAB — COMPREHENSIVE METABOLIC PANEL
Albumin: 4.4 (ref 3.5–5.0)
Calcium: 9.7 (ref 8.7–10.7)

## 2021-09-06 DIAGNOSIS — M1000 Idiopathic gout, unspecified site: Secondary | ICD-10-CM | POA: Diagnosis not present

## 2021-09-06 DIAGNOSIS — E1122 Type 2 diabetes mellitus with diabetic chronic kidney disease: Secondary | ICD-10-CM | POA: Diagnosis not present

## 2021-09-06 DIAGNOSIS — E785 Hyperlipidemia, unspecified: Secondary | ICD-10-CM | POA: Diagnosis not present

## 2021-09-06 DIAGNOSIS — I2510 Atherosclerotic heart disease of native coronary artery without angina pectoris: Secondary | ICD-10-CM | POA: Diagnosis not present

## 2021-09-06 DIAGNOSIS — C61 Malignant neoplasm of prostate: Secondary | ICD-10-CM | POA: Diagnosis not present

## 2021-09-06 DIAGNOSIS — I129 Hypertensive chronic kidney disease with stage 1 through stage 4 chronic kidney disease, or unspecified chronic kidney disease: Secondary | ICD-10-CM | POA: Diagnosis not present

## 2021-09-06 DIAGNOSIS — N1832 Chronic kidney disease, stage 3b: Secondary | ICD-10-CM | POA: Diagnosis not present

## 2021-09-06 DIAGNOSIS — Z87442 Personal history of urinary calculi: Secondary | ICD-10-CM | POA: Diagnosis not present

## 2021-09-14 ENCOUNTER — Encounter: Payer: Self-pay | Admitting: Physician Assistant

## 2021-09-29 ENCOUNTER — Other Ambulatory Visit: Payer: Self-pay | Admitting: Cardiology

## 2021-10-04 DIAGNOSIS — C61 Malignant neoplasm of prostate: Secondary | ICD-10-CM | POA: Diagnosis not present

## 2021-10-04 LAB — PSA: PSA: 0.024

## 2021-10-10 DIAGNOSIS — N200 Calculus of kidney: Secondary | ICD-10-CM | POA: Diagnosis not present

## 2021-10-10 DIAGNOSIS — R3121 Asymptomatic microscopic hematuria: Secondary | ICD-10-CM | POA: Diagnosis not present

## 2021-10-10 DIAGNOSIS — N403 Nodular prostate with lower urinary tract symptoms: Secondary | ICD-10-CM | POA: Diagnosis not present

## 2021-10-10 DIAGNOSIS — C61 Malignant neoplasm of prostate: Secondary | ICD-10-CM | POA: Diagnosis not present

## 2021-10-10 DIAGNOSIS — R351 Nocturia: Secondary | ICD-10-CM | POA: Diagnosis not present

## 2021-11-03 ENCOUNTER — Other Ambulatory Visit: Payer: Self-pay | Admitting: Physician Assistant

## 2021-11-09 NOTE — Progress Notes (Signed)
Chronic Care Management Pharmacy Note  11/22/2021 Name:  [name] MRN:  [id number] DOB:  01-12-1946  Subjective: Nicholas Maxwell is an 76 y.o. year old male who is a primary patient of [name], [place].  The CCM team was consulted for assistance with disease management and care coordination needs.    Engaged with patient by telephone for follow up visit in response to provider referral for pharmacy case management and/or care coordination services.   Consent to Services:  The patient was given the following information about Chronic Care Management services today, agreed to services, and gave verbal consent: 1. CCM service includes personalized support from designated clinical staff supervised by the primary care provider, including individualized plan of care and coordination with other care providers 2. 24/7 contact phone numbers for assistance for urgent and routine care needs. 3. Service will only be billed when office clinical staff spend 20 minutes or more in a month to coordinate care. 4. Only one practitioner may furnish and bill the service in a calendar month. 5.The patient may stop CCM services at any time (effective at the end of the month) by phone call to the office staff. 6. The patient will be responsible for cost sharing (co-pay) of up to 20% of the service fee (after annual deductible is met). Patient agreed to services and consent obtained.  Patient Care Team: [name], [place] as PCP - General (Physician Assistant) [name], MD as Attending Physician (Urology) Thalia Bloodgood, OD as Referring Physician (Optometry) Newell Coral., MD as Referring Physician (Gastroenterology) Nigel Mormon, MD as Consulting Physician (Cardiology) Lenard Simmer, [facility] (Gastroenterology) Paralee Cancel, MD as Consulting Physician (Orthopedic Surgery) [name], MD as Attending Physician (Urology) Edythe Clarity, [facility] (Pharmacist)  Recent office  visits:  12/06/20- [name], PA- seen for 3 month f/u for dm and htn, labs ordered, no medication changes, no follow up documented   Recent consult visits:  No visits noted   Hospital visits:  None in previous 6 months  Lab Results  Component Value Date   CREATININE 1.4 (A) 08/30/2021   BUN 22 (A) 08/30/2021   GFR 43.21 (L) 06/14/2021   GFRNONAA 58 (L) 10/21/2019   GFRAA >60 10/21/2019   NA 138 08/30/2021   K 5.1 08/30/2021   CALCIUM 9.7 08/30/2021   CO2 24 (A) 08/30/2021    Lab Results  Component Value Date/Time   HGBA1C 6.0 06/14/2021 09:37 AM   HGBA1C 6.6 (H) 12/07/2020 09:15 AM   HGBA1C 5.3 08/21/2018 12:00 AM   GFR 43.21 (L) 06/14/2021 09:37 AM   GFR 44.38 (L) 12/07/2020 09:15 AM   MICROALBUR <0.7 06/14/2021 09:37 AM    Last diabetic Eye exam:  Lab Results  Component Value Date/Time   HMDIABEYEEXA No Retinopathy 04/25/2021 12:00 AM    Last diabetic Foot exam: No results found for: HMDIABFOOTEX   Lab Results  Component Value Date   CHOL 128 06/28/2021   HDL 43 06/28/2021   LDLCALC 43 06/28/2021   LDLDIRECT 59.0 05/28/2019   TRIG 276 (H) 06/28/2021   CHOLHDL 3.0 06/28/2021    Hepatic Function Latest Ref Rng & Units 08/30/2021 09/07/2020 10/08/2019  Total Protein 6.1 - 8.1 g/dL - 6.7 6.6  Albumin 3.5 - 5.0 4.4 - 3.9  AST 10 - 35 U/L - 14 15  ALT 9 - 46 U/L - 14 16  Alk Phosphatase 39 - 117 U/L - - 60  Total Bilirubin 0.2 - 1.2 mg/dL -  0.5 0.5    Lab Results  Component Value Date/Time   TSH 2.05 08/21/2018 12:00 AM    CBC Latest Ref Rng & Units 09/07/2020 02/06/2020 01/21/2020  WBC 3.8 - 10.8 Thousand/uL 5.6 5.6 6.2  Hemoglobin 13.2 - 17.1 g/dL 12.1(L) 12.8(L) 12.4(L)  Hematocrit 38.5 - 50.0 % 34.6(L) 37.8(L) 37.0(L)  Platelets 140 - 400 Thousand/uL 218 215.0 243.0    No results found for: VD25OH  Clinical ASCVD: Yes  The ASCVD Risk score (Arnett DK, et al., 2019) failed to calculate for the following reasons:   The patient has a prior MI or  stroke diagnosis    Depression screen [facility] 2/9 11/21/2021 11/15/2020 09/07/2020  Decreased Interest 0 0 0  Down, Depressed, Hopeless 0 0 0  PHQ - 2 Score 0 0 0   Social History   Tobacco Use  Smoking Status Former   Packs/day: 1.00   Years: 30.00   Pack years: 30.00   Types: Cigarettes   Quit date: 2003   Years since quitting: 20.1  Smokeless Tobacco Never   BP Readings from Last 3 Encounters:  11/21/21 120/68  06/24/21 115/64  06/14/21 110/60   Pulse Readings from Last 3 Encounters:  11/21/21 (!) 58  06/24/21 (!) 56  06/14/21 (!) 52   Wt Readings from Last 3 Encounters:  11/21/21 205 lb 6.4 oz (93.2 kg)  06/24/21 202 lb (91.6 kg)  06/14/21 200 lb 8 oz (90.9 kg)   Assessment/Interventions: Review of patient past medical history, allergies, medications, health status, including review of consultants reports, laboratory and other test data, was performed as part of comprehensive evaluation and provision of chronic care management services.   SDOH:  (Social Determinants of Health) assessments and interventions performed:   CCM Care Plan  Allergies  Allergen Reactions   Crestor [Rosuvastatin Calcium] Anaphylaxis and Swelling    Tongue swells/throat closes   Statins Other (See Comments)    Muscle/joint pain with ALL other statin drug but Crestor is severe   Medications Reviewed Today     Reviewed by Edythe Clarity, [facility] (Pharmacist) on 11/22/21 at 1350  Med List Status: <None>   Medication Order Taking? Sig Documenting Provider Last Dose Status Informant  alfuzosin (UROXATRAL) 10 MG 24 hr tablet [id number] Yes Take 10 mg by mouth daily with supper. [provider] Taking Active Self  allopurinol (ZYLOPRIM) 300 MG tablet [id number] Yes TAKE 1 TABLET (300 MG TOTAL) BY MOUTH DAILY WITH LUNCH. [name], [place] Taking Active   amoxicillin (AMOXIL) 500 MG capsule [id number] Yes TAKE 4 CAPSULES BY MOUTH 1 HOUR PRIOR TO DENTAL WORK [provider] Taking  Active   ASPIRIN 81 81 MG chewable tablet [id number] Yes Chew 81 mg by mouth daily. [provider] Taking Active   Blood Glucose Monitoring Suppl (ONE TOUCH ULTRA 2) w/Device KIT [id number] Yes USE TO CHECK BLOOD SUGAR [name], PA Taking Active   Calcium Carb-Cholecalciferol (CALCIUM 600+D3 PO) [id number] Yes Take by mouth. [provider] Taking Active   Cyanocobalamin (B-12) 2500 MCG TABS [id number] Yes Take 1 tablet by mouth. [provider] Taking Active   glucose blood (ONETOUCH ULTRA) test strip [id number] Yes USE TO CHECK BLOOD SUGAR DAILY AND PRN [name], PA Taking Active   metoprolol succinate (TOPROL-XL) 25 MG 24 hr tablet [id number] Yes TAKE 1 TABLET BY MOUTH EVERY DAY Patwardhan, Manish J, MD Taking Active   nitroGLYCERIN (NITROSTAT) 0.4 MG SL tablet [id number] Yes Place 1 tablet (0.4 mg total) under the  tongue every 5 (five) minutes x 3 doses as needed for chest pain. Patwardhan, Reynold Bowen, MD Taking Active   Potassium Citrate 15 MEQ (1620 MG) TBCR [id number]  Take 2 tablets by mouth 2 (two) times daily. [provider]  Active   ranolazine (RANEXA) 1000 MG SR tablet [id number] Yes Take 1 tablet (1,000 mg total) by mouth 2 (two) times daily. Nigel Mormon, MD Taking Active   REPATHA SURECLICK 923 MG/ML Darden Palmer [id number] Yes INJECT 1ML EVERY 2 WEEKS Patwardhan, Reynold Bowen, MD Taking Active            Patient Active Problem List   Diagnosis Date Noted   Chronic kidney disease, stage 3b ([place]) 07/19/2020   Malignant neoplasm of prostate ([place]) 06/11/2020   S/P right TKA 10/23/2019   COVID-19 09/07/2019   Statin intolerance 11/21/2018   Coronary artery disease involving native coronary artery of native heart without angina pectoris 02/26/2018   History of kidney stones 02/26/2018   Osteoarthritis of right knee 02/26/2018   Post PTCA 05/24/2017   Myocardial infarction ([place]) 04/2017   Polyp of colon, adenomatous 12/08/2016   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 11/26/2016   Essential hypertension 05/12/2015   Gouty arthropathy 05/12/2015   Hypertrophic and atrophic condition of skin 05/12/2015   Hyperlipidemia 05/12/2015   Other seborrheic keratosis 05/12/2015   Type 2 diabetes mellitus without complication, without long-term current use of insulin ([place]) 05/12/2015   Hypertrophy of prostate without urinary obstruction and other lower urinary tract symptoms (LUTS) 04/08/2013    Immunization History  Administered Date(s) Administered   Fluad Quad(high Dose 65+) 05/28/2019, 06/08/2020, 06/14/2021   Influenza, High Dose Seasonal PF 07/10/2016, 06/27/2017, 06/11/2018   Influenza-Unspecified 07/05/2010, 07/17/2011, 07/08/2012, 06/24/2013, 07/21/2014, 06/28/2015, 07/10/2016, 06/27/2017   Pneumococcal Conjugate-13 11/09/2016   Pneumococcal Polysaccharide-23 10/07/2012   Tdap 02/22/2018   Zoster, Live 05/16/2013   Conditions to be addressed/monitored:  CAD, HTN, HLD and DMII  Care Plan : CHL AMB "PATIENT-SPECIFIC PROBLEM"  Updates made by Edythe Clarity, RPH since 11/22/2021 12:00 AM     Problem: CAD, HTN, HLD and DMII   Priority: High  Note:   Pharmacist Clinical Goal(s):  Over the next 365 days, patient will verbalize ability to afford treatment regimen through collaboration with PharmD and provider.   Interventions: 1:1 collaboration with [name], PA regarding development and update of comprehensive plan of care as evidenced by provider attestation and co-signature Inter-disciplinary care team collaboration (see longitudinal plan of care) Comprehensive medication review performed; medication list updated in electronic medical record  Hypertension (BP goal <130/80) -controlled -Current treatment: Metoprolol succinate 25 mg once daily  Ranolazine 500 mg every 12 hours  -Medications previously tried: ramipril 5 mg once daily  -Current home readings: 100-110-60s  -Current dietary  habits: low sodium -Current exercise habits: [employer] deep water aerobics multiple times per week.  -Denies hypotensive/hypertensive symptoms -Educated on BP goals and benefits of medications for prevention of heart attack, stroke and kidney damage; Daily salt intake goal < 2300 mg; Exercise goal of 150 minutes per week; Importance of home blood pressure monitoring; -Counseled to monitor BP at home at least once every 1-2 weeks, document, and provide log at future appointments -Counseled on diet and exercise extensively Recommended to continue current medication Counseled on signs of low blood pressure  Hyperlipidemia: (LDL goal < 70) -controlled -Current treatment: Repatha 140 mg injection every 2 weeks -Medications previously tried: statin intolerant/allergy  -Current dietary patterns: see diabetes -Current  exercise habits: see diabetes -Educated on Cholesterol goals;  -Counseled on diet and exercise extensively Recommended to continue current medication  Diabetes (A1c goal <7%) -controlled -Current medications: n/a -Medications previously tried: metformin  -Current home glucose readings fasting glucose: n/a post prandial glucose: n/a -Denies hypoglycemic/hyperglycemic symptoms -Current meal patterns:  breakfast: cereal, maybe egg, small piece of bacon, oatmeal, pancakes  lunch: toss salad  dinner: minimal sandwiches. Fish, tuna. Green vegetables, raw fruits. snacks: dry roasted peanuts, minimal chips  drinks: no coffee, apple cider or orange juice -Current exercise: [employer] deep water aerobics three times -Educated onA1c and blood sugar goals; Complications of diabetes including kidney damage, retinal damage, and cardiovascular disease; Exercise goal of 150 minutes per week; -Counseled to check feet daily and get yearly eye exams -Counseled on diet and exercise extensively  Patient Goals/Self-Care Activities Over the next 365 days, patient will:  - take medications as  prescribed check blood pressure once every 1-2 weeks, document, and provide at future appointments  Follow Up Plan: Telephone follow up appointment with care management team member scheduled for: 05/2021    Long-Range Goal: Patient Stated   Start Date: 11/08/2020  Expected End Date: 11/08/2021  Recent Progress: On track  Note:   Pharmacist Clinical Goal(s):  Over the next 365 days, patient will verbalize ability to afford treatment regimen through collaboration with PharmD and provider.   Interventions: 1:1 collaboration with [name], PA regarding development and update of comprehensive plan of care as evidenced by provider attestation and co-signature Inter-disciplinary care team collaboration (see longitudinal plan of care) Comprehensive medication review performed; medication list updated in electronic medical record  Hypertension (BP goal <130/80) -controlled -Current treatment: Metoprolol succinate 25 mg once daily Appropriate, Effective, Safe, Accessible Ranolazine 500 mg every 12 hours Appropriate, Effective, Safe, Accessible -Medications previously tried: ramipril 5 mg once daily  -Current home readings: 100-110-60s  -Current dietary habits: low sodium -Current exercise habits: [employer] deep water aerobics multiple times per week.  -Denies hypotensive/hypertensive symptoms -Educated on BP goals and benefits of medications for prevention of heart attack, stroke and kidney damage; Daily salt intake goal < 2300 mg; Exercise goal of 150 minutes per week; Importance of home blood pressure monitoring; -Counseled to monitor BP at home at least once every 1-2 weeks, document, and provide log at future appointments -Counseled on diet and exercise extensively Recommended to continue current medication Counseled on signs of low blood pressure  Update 05/10/21 113/66 BP reading at home recently Denies any dizziness or HA's - continue current exercise Continue current meds for  now  Update 11/22/21 BP controlled at home, no dizziness or HA Continues same meds as previous No changes needed continue as previous FU in 1 year.  Hyperlipidemia: (LDL goal < 70) -controlled -Current treatment: Repatha 140 mg injection every 2 weeks Appropriate, Effective, Safe, Accessible -Medications previously tried: statin intolerant/allergy  -Current dietary patterns: see diabetes -Current exercise habits: see diabetes -Educated on Cholesterol goals;  -Counseled on diet and exercise extensively Recommended to continue current medication  Update 05/10/21 Patient reports he is in the donut hole which has caused his Repatha to go up about $30 per month.  He is still able to afford it and reports good adherence.  Was not able to apply for assistance due to income once he got tax information back from last year.  Will re-evaluate next year to see if he is able to qualify for the [place] program through [place]. Continue current meds - LDL looks  excellent  Update 11/22/21 LDL excellent, continues to be adherent with Repatha. [place] is open for hypercholesterolemia, will assist in application to avoid donut hole this year. Continues to be active with no concerns.  Diabetes (A1c goal <7%) -controlled -Current medications: n/a -Medications previously tried: metformin  -Current home glucose readings fasting glucose: n/a post prandial glucose: n/a -Denies hypoglycemic/hyperglycemic symptoms -Current meal patterns:  breakfast: cereal, maybe egg, small piece of bacon, oatmeal, pancakes  lunch: toss salad  dinner: minimal sandwiches. Fish, tuna. Green vegetables, raw fruits. snacks: dry roasted peanuts, minimal chips  drinks: no coffee, apple cider or orange juice -Current exercise: [employer] deep water aerob[employer] three times -Educated onA1c and blood sugar goals; Complications of diabetes including kidney damage, retinal damage, and cardiovascular  disease; Exercise goal of 150 minutes per week; -Counseled to check feet daily and get yearly eye exams -Counseled on diet and exercise extensively  Patient Goals/Self-Care Activities Over the next 365 days, patient will:  - take medications as prescribed check blood pressure once every 1-2 weeks, document, and provide at future appointments  Follow Up Plan: Telephone follow up appointment with care management team member scheduled for: 6 months          Medication Assistance: Patient in donut hole does not qualify for PAP due to income.  Will retry next year.  Patient's preferred pharmacy is:  [id number] [place], [place] - [address] [place] [id number]: [phone]: [phone]  Uses pill box? Yes Pt endorses 100% compliance  We discussed: Benefits of medication synchronization, packaging and delivery as well as enhanced pharmacist oversight with [employer]. Patient decided to: Continue current medication management strategy  Follow Up:  Patient agrees to Care Plan and Follow-up.  Plan: 6 month follow up   Future Appointments  Date Time Provider [place] 06/23/2022 10:30 AM PNigel Mormon MD [facility] None  12/04/2022  1:00 PM [facility] HEALTH COACH [facility]

## 2021-11-16 ENCOUNTER — Other Ambulatory Visit: Payer: Self-pay | Admitting: Cardiology

## 2021-11-21 ENCOUNTER — Ambulatory Visit (INDEPENDENT_AMBULATORY_CARE_PROVIDER_SITE_OTHER): Payer: PPO

## 2021-11-21 ENCOUNTER — Other Ambulatory Visit: Payer: Self-pay

## 2021-11-21 VITALS — BP 120/68 | HR 58 | Temp 97.6°F | Wt 205.4 lb

## 2021-11-21 DIAGNOSIS — Z0000 Encounter for general adult medical examination without abnormal findings: Secondary | ICD-10-CM

## 2021-11-21 NOTE — Patient Instructions (Signed)
Nicholas Maxwell , Thank you for taking time to come for your Medicare Wellness Visit. I appreciate your ongoing commitment to your health goals. Please review the following plan we discussed and let me know if I can assist you in the future.   Screening recommendations/referrals: Colonoscopy: Done 07/01/19 repeat every 5 years  Recommended yearly ophthalmology/optometry visit for glaucoma screening and checkup Recommended yearly dental visit for hygiene and checkup  Vaccinations: Influenza vaccine: Done 06/14/21 repeat every year  Pneumococcal vaccine: Up to date Tdap vaccine: Done 02/22/18 repeat every 10 years  Shingles vaccine: Shingrix discussed. Please contact your pharmacy for coverage information.    Covid-19: Declined and discussed  Advanced directives: Copies in chart  Conditions/risks identified: Lose a few lbs   Next appointment: Follow up in one year for your annual wellness visit.   Preventive Care 75 Years and Older, Male Preventive care refers to lifestyle choices and visits with your health care provider that can promote health and wellness. What does preventive care include? A yearly physical exam. This is also called an annual well check. Dental exams once or twice a year. Routine eye exams. Ask your health care provider how often you should have your eyes checked. Personal lifestyle choices, including: Daily care of your teeth and gums. Regular physical activity. Eating a healthy diet. Avoiding tobacco and drug use. Limiting alcohol use. Practicing safe sex. Taking low doses of aspirin every day. Taking vitamin and mineral supplements as recommended by your health care provider. What happens during an annual well check? The services and screenings done by your health care provider during your annual well check will depend on your age, overall health, lifestyle risk factors, and family history of disease. Counseling  Your health care provider may ask you questions  about your: Alcohol use. Tobacco use. Drug use. Emotional well-being. Home and relationship well-being. Sexual activity. Eating habits. History of falls. Memory and ability to understand (cognition). Work and work Statistician. Screening  You may have the following tests or measurements: Height, weight, and BMI. Blood pressure. Lipid and cholesterol levels. These may be checked every 5 years, or more frequently if you are over 75 years old. Skin check. Lung cancer screening. You may have this screening every year starting at age 75 if you have a 30-pack-year history of smoking and currently smoke or have quit within the past 15 years. Fecal occult blood test (FOBT) of the stool. You may have this test every year starting at age 75. Flexible sigmoidoscopy or colonoscopy. You may have a sigmoidoscopy every 5 years or a colonoscopy every 10 years starting at age 75. Prostate cancer screening. Recommendations will vary depending on your family history and other risks. Hepatitis C blood test. Hepatitis B blood test. Sexually transmitted disease (STD) testing. Diabetes screening. This is done by checking your blood sugar (glucose) after you have not eaten for a while (fasting). You may have this done every 1-3 years. Abdominal aortic aneurysm (AAA) screening. You may need this if you are a current or former smoker. Osteoporosis. You may be screened starting at age 75 if you are at high risk. Talk with your health care provider about your test results, treatment options, and if necessary, the need for more tests. Vaccines  Your health care provider may recommend certain vaccines, such as: Influenza vaccine. This is recommended every year. Tetanus, diphtheria, and acellular pertussis (Tdap, Td) vaccine. You may need a Td booster every 10 years. Zoster vaccine. You may need this after age  75. Pneumococcal 13-valent conjugate (PCV13) vaccine. One dose is recommended after age 75. Pneumococcal  polysaccharide (PPSV23) vaccine. One dose is recommended after age 75. Talk to your health care provider about which screenings and vaccines you need and how often you need them. This information is not intended to replace advice given to you by your health care provider. Make sure you discuss any questions you have with your health care provider. Document Released: 10/15/2015 Document Revised: 06/07/2016 Document Reviewed: 07/20/2015 Elsevier Interactive Patient Education  2017 [place] Prevention in the Home Falls can cause injuries. They can happen to people of all ages. There are many things you can do to make your home safe and to help prevent falls. What can I do on the outside of my home? Regularly fix the edges of walkways and driveways and fix any cracks. Remove anything that might make you trip as you walk through a door, such as a raised step or threshold. Trim any bushes or trees on the path to your home. Use bright outdoor lighting. Clear any walking paths of anything that might make someone trip, such as rocks or tools. Regularly check to see if handrails are loose or broken. Make sure that both sides of any steps have handrails. Any raised decks and porches should have guardrails on the edges. Have any leaves, snow, or ice cleared regularly. Use sand or salt on walking paths during winter. Clean up any spills in your garage right away. This includes oil or grease spills. What can I do in the bathroom? Use night lights. Install grab bars by the toilet and in the tub and shower. Do not use towel bars as grab bars. Use non-skid mats or decals in the tub or shower. If you need to sit down in the shower, use a plastic, non-slip stool. Keep the floor dry. Clean up any water that spills on the floor as soon as it happens. Remove soap buildup in the tub or shower regularly. Attach bath mats securely with double-sided non-slip rug tape. Do not have throw rugs and other  things on the floor that can make you trip. What can I do in the bedroom? Use night lights. Make sure that you have a light by your bed that is easy to reach. Do not use any sheets or blankets that are too big for your bed. They should not hang down onto the floor. Have a firm chair that has side arms. You can use this for support while you get dressed. Do not have throw rugs and other things on the floor that can make you trip. What can I do in the kitchen? Clean up any spills right away. Avoid walking on wet floors. Keep items that you use a lot in easy-to-reach places. If you need to reach something above you, use a strong step stool that has a grab bar. Keep electrical cords out of the way. Do not use floor polish or wax that makes floors slippery. If you must use wax, use non-skid floor wax. Do not have throw rugs and other things on the floor that can make you trip. What can I do with my stairs? Do not leave any items on the stairs. Make sure that there are handrails on both sides of the stairs and use them. Fix handrails that are broken or loose. Make sure that handrails are as long as the stairways. Check any carpeting to make sure that it is firmly attached to the stairs. Fix  any carpet that is loose or worn. Avoid having throw rugs at the top or bottom of the stairs. If you do have throw rugs, attach them to the floor with carpet tape. Make sure that you have a light switch at the top of the stairs and the bottom of the stairs. If you do not have them, ask someone to add them for you. What else can I do to help prevent falls? Wear shoes that: Do not have high heels. Have rubber bottoms. Are comfortable and fit you well. Are closed at the toe. Do not wear sandals. If you use a stepladder: Make sure that it is fully opened. Do not climb a closed stepladder. Make sure that both sides of the stepladder are locked into place. Ask someone to hold it for you, if possible. Clearly  mark and make sure that you can see: Any grab bars or handrails. First and last steps. Where the edge of each step is. Use tools that help you move around (mobility aids) if they are needed. These include: Canes. Walkers. Scooters. Crutches. Turn on the lights when you go into a dark area. Replace any light bulbs as soon as they burn out. Set up your furniture so you have a clear path. Avoid moving your furniture around. If any of your floors are uneven, fix them. If there are any pets around you, be aware of where they are. Review your medicines with your doctor. Some medicines can make you feel dizzy. This can increase your chance of falling. Ask your doctor what other things that you can do to help prevent falls. This information is not intended to replace advice given to you by your health care provider. Make sure you discuss any questions you have with your health care provider. Document Released: 07/15/2009 Document Revised: 02/24/2016 Document Reviewed: 10/23/2014 Elsevier Interactive Patient Education  2017 [employer].

## 2021-11-21 NOTE — Progress Notes (Signed)
Subjective:   Nicholas Maxwell is a 76 y.o. male who presents for Medicare Annual/Subsequent preventive examination.  Review of Systems     Cardiac Risk Factors include: advanced age (>109mn, >>100women);dyslipidemia;male gender;hypertension;diabetes mellitus     Objective:    Today's Vitals   11/21/21 1304  BP: 120/68  Pulse: (!) 58  Temp: 97.6 F (36.4 C)  SpO2: 98%  Weight: 205 lb 6.4 oz (93.2 kg)   Body mass index is 29.47 kg/m.  Advanced Directives 11/21/2021 11/24/2020 11/15/2020 06/29/2020 10/21/2019 08/25/2019 08/21/2017  Does Patient Have a Medical Advance Directive? Yes Yes Yes Yes Yes Yes No  Type of [place] Healthcare Power of [place] Living will Living will;Healthcare Power of Attorney -  Does patient want to make changes to medical advance directive? - - - No - Patient declined - No - Patient declined -  Copy of [place] Chart? Yes - validated most recent copy scanned in chart (See row information) - Yes - validated most recent copy scanned in chart (See row information) No - copy requested - No - copy requested -  Would patient like information on creating a medical advance directive? - - - - - - No - Patient declined    Current Medications (verified) Outpatient Encounter Medications as of 11/21/2021  Medication Sig   alfuzosin (UROXATRAL) 10 MG 24 hr tablet Take 10 mg by mouth daily with supper.   allopurinol (ZYLOPRIM) 300 MG tablet TAKE 1 TABLET (300 MG TOTAL) BY MOUTH DAILY WITH LUNCH.   ASPIRIN 81 81 MG chewable tablet Chew 81 mg by mouth daily.   Blood Glucose Monitoring Suppl (ONE TOUCH ULTRA 2) w/Device KIT USE TO CHECK BLOOD SUGAR   Calcium Carb-Cholecalciferol (CALCIUM 600+D3 PO) Take by mouth.   Cyanocobalamin (B-12) 2500 MCG TABS Take 1 tablet by mouth.   glucose blood (ONETOUCH ULTRA) test strip USE TO CHECK  BLOOD SUGAR DAILY AND PRN   metoprolol succinate (TOPROL-XL) 25 MG 24 hr tablet TAKE 1 TABLET BY MOUTH EVERY DAY   nitroGLYCERIN (NITROSTAT) 0.4 MG SL tablet Place 1 tablet (0.4 mg total) under the tongue every 5 (five) minutes x 3 doses as needed for chest pain.   ranolazine (RANEXA) 1000 MG SR tablet Take 1 tablet (1,000 mg total) by mouth 2 (two) times daily.   REPATHA SURECLICK 1235MG/ML SOAJ INJECT 1ML EVERY 2 WEEKS   amoxicillin (AMOXIL) 500 MG capsule TAKE 4 CAPSULES BY MOUTH 1 HOUR PRIOR TO DENTAL WORK   Potassium Citrate 15 MEQ (1620 MG) TBCR Take 2 tablets by mouth 2 (two) times daily.   [DISCONTINUED] ranolazine (RANEXA) 500 MG 12 hr tablet TAKE 2 TABLETS BY MOUTH TWICE A DAY   No facility-administered encounter medications on file as of 11/21/2021.    Allergies (verified) Crestor [rosuvastatin calcium] and Statins   History: Past Medical History:  Diagnosis Date   History of kidney stones    Myocardial infarction ([place] 04/2017   Prostate cancer ([place] 05/2020   Statin intolerance    Used >2 different statins   Past Surgical History:  Procedure Laterality Date   COLONOSCOPY     March of 2018    CORONARY ANGIOPLASTY WITH STENT PLACEMENT  05/24/2017   CORONARY STENT INTERVENTION N/A 05/24/2017   Procedure: CORONARY STENT INTERVENTION;  Surgeon: PNigel Mormon MD;  [facility];  Service: Cardiovascular;  Laterality:  N/A;  MID Circumflex 4.0x22 Onyx MID LAD 2.75x15 Onyx   CYSTOSCOPY WITH URETEROSCOPY, STONE BASKETRY AND STENT PLACEMENT  ~ [place] PRESSURE WIRE/FFR STUDY N/A 05/24/2017   Procedure: INTRAVASCULAR PRESSURE WIRE/FFR STUDY;  Surgeon: Nigel Mormon, MD;  Location: [facility];  Service: Cardiovascular;  Laterality: N/A;  MID LAD   KNEE ARTHROSCOPY Right 2016   LEFT HEART CATH AND CORONARY ANGIOGRAPHY N/A 05/24/2017   Procedure: LEFT HEART CATH AND CORONARY ANGIOGRAPHY;  Surgeon: Nigel Mormon, MD;  Location: [facility];  Service: Cardiovascular;  Laterality: N/A;   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   Procedure: TOTAL KNEE ARTHROPLASTY;  Surgeon: Paralee Cancel, MD;  Location: [facility];  Service: Orthopedics;  Laterality: Right;  70 mins   UMBILICAL HERNIA REPAIR  1992   UPPER GASTROINTESTINAL ENDOSCOPY     Family History  Problem Relation Age of Onset   Transient ischemic attack Father 61   Bone cancer Father    CAD Mother 18       CABG   Diabetes Mother    Breast cancer Neg Hx    Colon cancer Neg Hx    Pancreatic cancer Neg Hx    Prostate cancer Neg Hx    Social History   Socioeconomic History   Marital status: Married    Spouse name: Not on file   Number of children: 2   Years of education: Not on file   Highest education level: Not on file  Occupational History   Occupation: retired    Comment: retired  Tobacco Use   Smoking status: Former    Packs/day: 1.00    Years: 30.00    Pack years: 30.00    Types: Cigarettes    Quit date: 2003    Years since quitting: 20.1   Smokeless tobacco: Never  Vaping Use   Vaping Use: Never used  Substance and Sexual Activity   Alcohol use: Not Currently    Comment: has drank since 2014   Drug use: No   Sexual activity: Not Currently  Other Topics Concern   Not on file  Social History Narrative   Married   Son and daughter   Lynnette Caffey   Worked in Research officer, trade union   Social Determinants of Health   Financial Resource Strain: Low Risk    Difficulty of Paying Living Expenses: Not hard at all  Food Insecurity: No Food Insecurity   Worried About Charity fundraiser in the Last Year: Never true   Arboriculturist in the Last Year: Never true  Transportation Needs: No Transportation Needs   Lack of Transportation (Medical): No   Lack of Transportation (Non-Medical): No  Physical Activity: Insufficiently Active   Days of Exercise per Week: 2 days   Minutes of Exercise per Session: 60 min  Stress: No  Stress Concern Present   Feeling of Stress : Not at all  Social Connections: Moderately Integrated   Frequency of Communication with Friends and Family: More than three times a week   Frequency of Social Gatherings with Friends and Family: More than three times a week   Attends Religious Services: More than 4 times per year   Active Member of [employer] or Organizations: No   Attends Archivist Meetings: Never   Marital Status: Married    Tobacco Counseling Counseling given: Not Answered   Clinical Intake:  Pre-visit preparation completed: Yes  Pain : No/denies pain  BMI - recorded: 29.47 Nutritional Status: BMI 25 -29 Overweight Nutritional Risks: None Diabetes: Yes CBG done?: No Did pt. bring in CBG monitor from home?: No  How often do you need to have someone help you when you read instructions, pamphlets, or other written materials from your doctor or pharmacy?: 1 - Never  Diabetic?Nutrition Risk Assessment:  Has the patient had any N/V/D within the last 2 months?  No  Does the patient have any non-healing wounds?  No  Has the patient had any unintentional weight loss or weight gain?  No   Diabetes:  Is the patient diabetic?  Yes  If diabetic, was a CBG obtained today?  No  Did the patient bring in their glucometer from home?  No  How often do you monitor your CBG's? N/A.   Financial Strains and Diabetes Management:  Are you having any financial strains with the device, your supplies or your medication? No .  Does the patient want to be seen by Chronic Care Management for management of their diabetes?  No  Would the patient like to be referred to a Nutritionist or for Diabetic Management?  No   Diabetic Exams:  Diabetic Eye Exam: Completed 04/25/21 Diabetic Foot Exam: Overdue, Pt has been advised about the importance in completing this exam. Pt is scheduled for diabetic foot exam on next appt .   Interpreter Needed?: No  Information entered by ::  Charlott Rakes, LPN   Activities of Daily Living In your present state of health, do you have any difficulty performing the following activities: 11/21/2021  Hearing? N  Vision? N  Difficulty concentrating or making decisions? N  Walking or climbing stairs? N  Dressing or bathing? N  Doing errands, shopping? N  Preparing Food and eating ? N  Using the Toilet? N  In the past six months, have you accidently leaked urine? N  Do you have problems with loss of bowel control? N  Managing your Medications? N  Managing your Finances? N  Housekeeping or managing your Housekeeping? N  Some recent data might be hidden    Patient Care Team: [name], [place] as PCP - General (Physician Assistant) [name], MD as Attending Physician (Urology) Thalia Bloodgood, [place] as Referring Physician (Optometry) Newell Coral., MD as Referring Physician (Gastroenterology) Nigel Mormon, MD as Consulting Physician (Cardiology) Lenard Simmer, [facility] (Gastroenterology) Paralee Cancel, MD as Consulting Physician (Orthopedic Surgery) [name], MD as Attending Physician (Urology) Madelin Rear, [facility] as Pharmacist (Pharmacist)  Indicate any recent Medical Services you may have received from other than [facility] providers in the past year (date may be approximate).     Assessment:   This is a routine wellness examination for [place].  Hearing/Vision screen Hearing Screening - Comments:: Denies any hearing issues  Vision Screening - Comments:: Pt follows up with [facility] Dr Kenton Kingfisher for annual eye exams   Dietary issues and exercise activities discussed: Current Exercise Habits: Structured exercise class, Type of exercise: Other - see comments (water aerobics), Time (Minutes): 60, Frequency (Times/Week): 2, Weekly Exercise (Minutes/Week): 120   Goals Addressed             This Visit's Progress    Patient Stated       Get some weight off       Depression Screen PHQ 2/9 Scores  11/21/2021 11/15/2020 09/07/2020 08/25/2019 05/28/2019 12/14/2017 08/31/2017  PHQ - 2 Score 0 0 0 0 0 0 0    Fall Risk Fall Risk  11/21/2021 11/15/2020 09/07/2020 08/27/2019 08/25/2019  Falls in the past year? 0 0 0 0 0  Comment - - - Emmi Telephone Survey: data to providers prior to load -  Number falls in past yr: 0 0 0 - -  Injury with Fall? 0 0 0 - 0  Risk for fall due to : Impaired vision Impaired vision - - -  Follow up Falls prevention discussed Falls prevention discussed Falls evaluation completed - Falls evaluation completed;Education provided;Falls prevention discussed    FALL RISK PREVENTION PERTAINING TO THE HOME:  Any stairs in or around the home? Yes  If so, are there any without handrails? No  Home free of loose throw rugs in walkways, pet beds, electrical cords, etc? Yes  Adequate lighting in your home to reduce risk of falls? Yes   ASSISTIVE DEVICES UTILIZED TO PREVENT FALLS:  Life alert? No  Use of a cane, walker or w/c? No  Grab bars in the bathroom? No  Shower chair or bench in shower? Yes  Elevated toilet seat or a handicapped toilet? No   TIMED UP AND GO:  Was the test performed? Yes .  Length of time to ambulate 10 feet: 10 sec.   Gait steady and fast without use of assistive device  Cognitive Function:     6CIT Screen 11/21/2021 11/15/2020 08/25/2019  What Year? 0 points 0 points 0 points  What month? 0 points 0 points 0 points  What time? 0 points - 0 points  Count back from 20 0 points 0 points 0 points  Months in reverse 0 points 0 points 0 points  Repeat phrase 0 points 0 points 0 points  Total Score 0 - 0    Immunizations Immunization History  Administered Date(s) Administered   Fluad Quad(high Dose 65+) 05/28/2019, 06/08/2020, 06/14/2021   Influenza, High Dose Seasonal PF 07/10/2016, 06/27/2017, 06/11/2018   Influenza-Unspecified 07/05/2010, 07/17/2011, 07/08/2012, 06/24/2013, 07/21/2014, 06/28/2015, 07/10/2016, 06/27/2017   Pneumococcal  Conjugate-13 11/09/2016   Pneumococcal Polysaccharide-23 10/07/2012   Tdap 02/22/2018   Zoster, Live 05/16/2013    TDAP status: Up to date  Flu Vaccine status: Up to date  Pneumococcal vaccine status: Up to date  Covid-19 vaccine status: Declined, Education has been provided regarding the importance of this vaccine but patient still declined. Advised may receive this vaccine at local pharmacy or Health Dept.or vaccine clinic. Aware to provide a copy of the vaccination record if obtained from local pharmacy or Health Dept. Verbalized acceptance and understanding.  Qualifies for Shingles Vaccine? Yes   Zostavax completed No   Shingrix Completed?: No.    Education has been provided regarding the importance of this vaccine. Patient has been advised to call insurance company to determine out of pocket expense if they have not yet received this vaccine. Advised may also receive vaccine at local pharmacy or Health Dept. Verbalized acceptance and understanding.  Screening Tests Health Maintenance  Topic Date Due   Zoster Vaccines- Shingrix (1 of 2) Never done   FOOT EXAM  12/12/2021 (Originally 11/21/2019)   COLONOSCOPY (Pts 45-26yr Insurance coverage will need to be confirmed)  12/05/2021   HEMOGLOBIN A1C  12/12/2021   OPHTHALMOLOGY EXAM  04/25/2022   URINE MICROALBUMIN  06/14/2022   TETANUS/TDAP  02/23/2028   Pneumonia Vaccine 75 Years old  Completed   INFLUENZA VACCINE  Completed   Hepatitis C Screening  Completed   HPV VACCINES  Aged Out   COVID-19 Vaccine  Discontinued    Health Maintenance  Health Maintenance  Due  Topic Date Due   Zoster Vaccines- Shingrix (1 of 2) Never done    Colorectal cancer screening: Type of screening: Colonoscopy. Completed 12/05/16. Repeat every 5 years   Additional Screening:  Hepatitis C Screening:  Completed 08/21/18  Vision Screening: Recommended annual ophthalmology exams for early detection of glaucoma and other disorders of the eye. Is  the patient up to date with their annual eye exam?  Yes  Who is the provider or what is the name of the office in which the patient attends annual eye exams? Dr Kenton Kingfisher  If pt is not established with a provider, would they like to be referred to a provider to establish care? No .   Dental Screening: Recommended annual dental exams for proper oral hygiene  Community Resource Referral / Chronic Care Management: CRR required this visit?  No   CCM required this visit?  No      Plan:     I have personally reviewed and noted the following in the patients chart:   Medical and social history Use of alcohol, tobacco or illicit drugs  Current medications and supplements including opioid prescriptions. Patient is not currently taking opioid prescriptions. Functional ability and status Nutritional status Physical activity Advanced directives List of other physicians Hospitalizations, surgeries, and ER visits in previous 12 months Vitals Screenings to include cognitive, depression, and falls Referrals and appointments  In addition, I have reviewed and discussed with patient certain preventive protocols, quality metrics, and best practice recommendations. A written personalized care plan for preventive services as well as general preventive health recommendations were provided to patient.     Willette Brace, LPN   [id number]   Nurse Notes: None

## 2021-11-22 ENCOUNTER — Ambulatory Visit (INDEPENDENT_AMBULATORY_CARE_PROVIDER_SITE_OTHER): Payer: PPO | Admitting: Pharmacist

## 2021-11-22 DIAGNOSIS — E782 Mixed hyperlipidemia: Secondary | ICD-10-CM

## 2021-11-22 DIAGNOSIS — I10 Essential (primary) hypertension: Secondary | ICD-10-CM

## 2021-11-22 NOTE — Patient Instructions (Addendum)
Visit Information   Goals Addressed             This Visit's Progress    Track and Manage My Blood Pressure-Hypertension       Timeframe:  Long-Range Goal Priority:  High Start Date:   11/22/21                          Expected End Date:  05/22/22                     Follow Up Date 02/19/22    - check blood pressure 3 times per week - choose a place to take my blood pressure (home, clinic or office, retail store) - write blood pressure results in a log or diary    Why is this important?   You won't feel high blood pressure, but it can still hurt your blood vessels.  High blood pressure can cause heart or kidney problems. It can also cause a stroke.  Making lifestyle changes like losing a little weight or eating less salt will help.  Checking your blood pressure at home and at different times of the day can help to control blood pressure.  If the doctor prescribes medicine remember to take it the way the doctor ordered.  Call the office if you cannot afford the medicine or if there are questions about it.     Notes:        Patient Care Plan: [facility] AMB "PATIENT-SPECIFIC PROBLEM"     Problem Identified: CAD, HTN, HLD and DMII   Priority: High  Note:   Pharmacist Clinical Goal(s):  Over the next 365 days, patient will verbalize ability to afford treatment regimen through collaboration with PharmD and provider.   Interventions: 1:1 collaboration with [name], PA regarding development and update of comprehensive plan of care as evidenced by provider attestation and co-signature Inter-disciplinary care team collaboration (see longitudinal plan of care) Comprehensive medication review performed; medication list updated in electronic medical record  Hypertension (BP goal <130/80) -controlled -Current treatment: Metoprolol succinate 25 mg once daily  Ranolazine 500 mg every 12 hours  -Medications previously tried: ramipril 5 mg once daily  -Current home readings:  100-110-60s  -Current dietary habits: low sodium -Current exercise habits: [employer] deep water aerobics multiple times per week.  -Denies hypotensive/hypertensive symptoms -Educated on BP goals and benefits of medications for prevention of heart attack, stroke and kidney damage; Daily salt intake goal < 2300 mg; Exercise goal of 150 minutes per week; Importance of home blood pressure monitoring; -Counseled to monitor BP at home at least once every 1-2 weeks, document, and provide log at future appointments -Counseled on diet and exercise extensively Recommended to continue current medication Counseled on signs of low blood pressure  Hyperlipidemia: (LDL goal < 70) -controlled -Current treatment: Repatha 140 mg injection every 2 weeks -Medications previously tried: statin intolerant/allergy  -Current dietary patterns: see diabetes -Current exercise habits: see diabetes -Educated on Cholesterol goals;  -Counseled on diet and exercise extensively Recommended to continue current medication  Diabetes (A1c goal <7%) -controlled -Current medications: n/a -Medications previously tried: metformin  -Current home glucose readings fasting glucose: n/a post prandial glucose: n/a -Denies hypoglycemic/hyperglycemic symptoms -Current meal patterns:  breakfast: cereal, maybe egg, small piece of bacon, oatmeal, pancakes  lunch: toss salad  dinner: minimal sandwiches. Fish, tuna. Green vegetables, raw fruits. snacks: dry roasted peanuts, minimal chips  drinks: no coffee, apple cider or orange juice -  Current exercise: [employer] deep water aerobics three times -Educated onA1c and blood sugar goals; Complications of diabetes including kidney damage, retinal damage, and cardiovascular disease; Exercise goal of 150 minutes per week; -Counseled to check feet daily and get yearly eye exams -Counseled on diet and exercise extensively  Patient Goals/Self-Care Activities Over the next 365 days, patient  will:  - take medications as prescribed check blood pressure once every 1-2 weeks, document, and provide at future appointments  Follow Up Plan: Telephone follow up appointment with care management team member scheduled for: 05/2021    Long-Range Goal: Patient Stated   Start Date: 11/08/2020  Expected End Date: 11/08/2021  Recent Progress: On track  Note:   Pharmacist Clinical Goal(s):  Over the next 365 days, patient will verbalize ability to afford treatment regimen through collaboration with PharmD and provider.   Interventions: 1:1 collaboration with [name], PA regarding development and update of comprehensive plan of care as evidenced by provider attestation and co-signature Inter-disciplinary care team collaboration (see longitudinal plan of care) Comprehensive medication review performed; medication list updated in electronic medical record  Hypertension (BP goal <130/80) -controlled -Current treatment: Metoprolol succinate 25 mg once daily Appropriate, Effective, Safe, Accessible Ranolazine 500 mg every 12 hours Appropriate, Effective, Safe, Accessible -Medications previously tried: ramipril 5 mg once daily  -Current home readings: 100-110-60s  -Current dietary habits: low sodium -Current exercise habits: [employer] deep water aerobics multiple times per week.  -Denies hypotensive/hypertensive symptoms -Educated on BP goals and benefits of medications for prevention of heart attack, stroke and kidney damage; Daily salt intake goal < 2300 mg; Exercise goal of 150 minutes per week; Importance of home blood pressure monitoring; -Counseled to monitor BP at home at least once every 1-2 weeks, document, and provide log at future appointments -Counseled on diet and exercise extensively Recommended to continue current medication Counseled on signs of low blood pressure  Update 05/10/21 113/66 BP reading at home recently Denies any dizziness or HA's - continue current  exercise Continue current meds for now  Update 11/22/21 BP controlled at home, no dizziness or HA Continues same meds as previous No changes needed continue as previous FU in 1 year.  Hyperlipidemia: (LDL goal < 70) -controlled -Current treatment: Repatha 140 mg injection every 2 weeks Appropriate, Effective, Safe, Accessible -Medications previously tried: statin intolerant/allergy  -Current dietary patterns: see diabetes -Current exercise habits: see diabetes -Educated on Cholesterol goals;  -Counseled on diet and exercise extensively Recommended to continue current medication  Update 05/10/21 Patient reports he is in the donut hole which has caused his Repatha to go up about $30 per month.  He is still able to afford it and reports good adherence.  Was not able to apply for assistance due to income once he got tax information back from last year.  Will re-evaluate next year to see if he is able to qualify for the [place] program through [facility] [place]. Continue current meds - LDL looks excellent  Update 11/22/21 LDL excellent, continues to be adherent with Repatha. [place] is open for hypercholesterolemia, will assist in application to avoid donut hole this year. Continues to be active with no concerns.  Diabetes (A1c goal <7%) -controlled -Current medications: n/a -Medications previously tried: metformin  -Current home glucose readings fasting glucose: n/a post prandial glucose: n/a -Denies hypoglycemic/hyperglycemic symptoms -Current meal patterns:  breakfast: cereal, maybe egg, small piece of bacon, oatmeal, pancakes  lunch: toss salad  dinner: minimal sandwiches. Fish, tuna. Green vegetables, raw fruits.  snacks: dry roasted peanuts, minimal chips  drinks: no coffee, apple cider or orange juice -Current exercise: [employer] deep water aerobics three times -Educated onA1c and blood sugar goals; Complications of diabetes including kidney damage, retinal  damage, and cardiovascular disease; Exercise goal of 150 minutes per week; -Counseled to check feet daily and get yearly eye exams -Counseled on diet and exercise extensively  Patient Goals/Self-Care Activities Over the next 365 days, patient will:  - take medications as prescribed check blood pressure once every 1-2 weeks, document, and provide at future appointments  Follow Up Plan: Telephone follow up appointment with care management team member scheduled for: 6 months         Patient verbalizes understanding of instructions and care plan provided today and agrees to view in [place]. Active MyChart status confirmed with patient.   Telephone follow up appointment with pharmacy team member scheduled for: 1 year  Edythe Clarity, [place], PharmD Clinical Pharmacist  [facility] [phone]

## 2021-11-27 ENCOUNTER — Encounter: Payer: Self-pay | Admitting: Physician Assistant

## 2021-11-29 DIAGNOSIS — E782 Mixed hyperlipidemia: Secondary | ICD-10-CM | POA: Diagnosis not present

## 2021-11-29 DIAGNOSIS — I10 Essential (primary) hypertension: Secondary | ICD-10-CM

## 2021-12-19 ENCOUNTER — Ambulatory Visit (INDEPENDENT_AMBULATORY_CARE_PROVIDER_SITE_OTHER): Payer: PPO | Admitting: Physician Assistant

## 2021-12-19 ENCOUNTER — Encounter: Payer: Self-pay | Admitting: Physician Assistant

## 2021-12-19 VITALS — HR 53 | Temp 97.5°F | Ht 70.0 in | Wt 204.2 lb

## 2021-12-19 DIAGNOSIS — E1122 Type 2 diabetes mellitus with diabetic chronic kidney disease: Secondary | ICD-10-CM | POA: Diagnosis not present

## 2021-12-19 DIAGNOSIS — E119 Type 2 diabetes mellitus without complications: Secondary | ICD-10-CM | POA: Diagnosis not present

## 2021-12-19 DIAGNOSIS — E782 Mixed hyperlipidemia: Secondary | ICD-10-CM

## 2021-12-19 DIAGNOSIS — Z0001 Encounter for general adult medical examination with abnormal findings: Secondary | ICD-10-CM | POA: Diagnosis not present

## 2021-12-19 DIAGNOSIS — N1832 Chronic kidney disease, stage 3b: Secondary | ICD-10-CM | POA: Diagnosis not present

## 2021-12-19 DIAGNOSIS — L989 Disorder of the skin and subcutaneous tissue, unspecified: Secondary | ICD-10-CM | POA: Diagnosis not present

## 2021-12-19 NOTE — Patient Instructions (Signed)
It was great to see you! ? ?I have put in a referral for you to see dermatology. ? ?No more colonoscopies per GI ? ?Please go to the lab for blood work.  ? ?Our office will call you with your results unless you have chosen to receive results via MyChart. ? ?If your blood work is normal we will follow-up each year for physicals and as scheduled for chronic medical problems. ? ?If anything is abnormal we will treat accordingly and get you in for a follow-up. ? ?Take care, ? ?Aldona Bar ?  ?

## 2021-12-19 NOTE — Progress Notes (Signed)
? ? ?Subjective:  ?  ?Nicholas Maxwell is a 76 y.o. male and is here for a comprehensive physical exam. ? ? ?HPI ? ?Health Maintenance Due  ?Topic Date Due  ? Zoster Vaccines- Shingrix (1 of 2) Never done  ? FOOT EXAM  11/21/2019  ? HEMOGLOBIN A1C  12/12/2021  ? ? ?Acute Concerns: ?Skin lesion -- has a skin lesion on the R side of his face that comes and goes. Wants a dermatologist to evaluate, does not currently have one. ? ?Chronic Issues: ?HTN ?Currently taking Toprol XL 25 mg daily with no complications. At home blood pressure readings are: normal. Patient denies chest pain, SOB, blurred vision, dizziness, unusual headaches, lower leg swelling. Patient is  compliant with medication. Denies excessive caffeine intake, stimulant usage, excessive alcohol intake, or increase in salt consumption. ? ?BP Readings from Last 3 Encounters:  ?11/21/21 120/68  ?06/24/21 115/64  ?06/14/21 110/60  ? ?HLD ?Pt is currently compliant with taking aspirin 81 mg daily and Repatha 1ML every 2 weeks with no complications. Although he is unable to tolerate multiple statins, he has been managing by adjusting his diet and exercising as able.  ? ?Diabetes ?Current DM meds: none. Blood sugars at home are: well controlled.  Denies: hypoglycemic or hyperglycemic episodes or symptoms. This patient's diabetes is complicated by HLD, HTN, CKD. ? ?Lab Results  ?Component Value Date  ? HGBA1C 6.0 06/14/2021  ? ? ?Health Maintenance: ?Immunizations -- Covid- Declined ?Influenza- UTD ?Tdap- UTD;2019 ?Colonoscopy -- Due every 5 years; 2018  ?PSA --  ?Lab Results  ?Component Value Date  ? PSA 0.039 06/07/2021  ? PSA 0.092 03/01/2021  ? PSA 1.04 09/01/2020  ? ?Diet -- overall doing well ?Sleep habits -- no major concerns ?Exercise -- [employer] on Tues and Thurs ?Weight -- Stable ?Recent weight history ?Wt Readings from Last 10 Encounters:  ?12/19/21 204 lb 3.2 oz (92.6 kg)  ?11/21/21 205 lb 6.4 oz (93.2 kg)  ?06/24/21 202 lb (91.6 kg)  ?06/14/21 200 lb 8  oz (90.9 kg)  ?12/06/20 205 lb (93 kg)  ?11/15/20 205 lb 12.8 oz (93.4 kg)  ?09/07/20 201 lb 4 oz (91.3 kg)  ?06/24/20 199 lb (90.3 kg)  ?06/08/20 197 lb (89.4 kg)  ?04/20/20 196 lb 6.1 oz (89.1 kg)  ? ?Body mass index is 29.3 kg/m?. ?Mood -- overall good ?Alcohol use --  reports that he does not currently use alcohol.  ?Tobacco use --  ?Tobacco Use: Medium Risk  ? Smoking Tobacco Use: Former  ? Smokeless Tobacco Use: Never  ? Passive Exposure: Not on file  ?  ? ?Depression screen [facility] 2/9 11/21/2021  ?Decreased Interest 0  ?Down, Depressed, Hopeless 0  ?PHQ - 2 Score 0  ? ? ?Other providers/specialists: ?Patient Care Team: ?Nicholas Coke, PA as PCP - General (Physician Assistant) ?[name], MD as Attending Physician (Urology) ?Thalia Bloodgood, OD as Referring Physician (Optometry) ?Newell Coral., MD as Referring Physician (Gastroenterology) ?Nigel Mormon, MD as Consulting Physician (Cardiology) ?[place] (Gastroenterology) ?Paralee Cancel, MD as Consulting Physician (Orthopedic Surgery) ?[name], MD as Attending Physician (Urology) ?Edythe Clarity, [facility] (Pharmacist)  ? ? ?PMHx, SurgHx, SocialHx, Medications, and Allergies were reviewed in the Visit Navigator and updated as appropriate.  ? ?Past Medical History:  ?Diagnosis Date  ? Allergy 12/1998  ? Seasonal spring and fall. Crestor and statins.  ? 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 History of kidney stones   ? Myocardial infarction (  [place]) 04/2017  ? Prostate cancer ([place]) 05/2020  ? Statin intolerance   ? Used >2 different statins  ? ? ? ?Past Surgical History:  ?Procedure Laterality Date  ? COLONOSCOPY    ? March of 2018   ? CORONARY ANGIOPLASTY WITH STENT PLACEMENT  05/24/2017  ? CORONARY STENT INTERVENTION N/A 05/24/2017  ? Procedure: CORONARY STENT INTERVENTION;  Surgeon: Nigel Mormon, MD;  Location: [facility];  Service: Cardiovascular;  Laterality: N/A;  MID Circumflex 4.0x22 Onyx ?MID LAD 2.75x15 Onyx  ?  CYSTOSCOPY WITH URETEROSCOPY, STONE BASKETRY AND STENT PLACEMENT  ~ 1990  ? HERNIA REPAIR    ? INTRAVASCULAR PRESSURE WIRE/FFR STUDY N/A 05/24/2017  ? Procedure: INTRAVASCULAR PRESSURE WIRE/FFR STUDY;  Surgeon: Nigel Mormon, MD;  Location: [facility];  Service: Cardiovascular;  Laterality: N/A;  MID LAD  ? JOINT REPLACEMENT  10/2019  ? Right knee  ? KNEE ARTHROSCOPY Right 2016  ? LEFT HEART CATH AND CORONARY ANGIOGRAPHY N/A 05/24/2017  ? Procedure: LEFT HEART CATH AND CORONARY ANGIOGRAPHY;  Surgeon: Nigel Mormon, MD;  Location: [facility];  Service: Cardiovascular;  Laterality: N/A;  ? 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 Procedure: TOTAL KNEE ARTHROPLASTY;  Surgeon: Paralee Cancel, MD;  Location: [facility];  Service: Orthopedics;  Laterality: Right;  70 mins  ? [name]  ? UPPER GASTROINTESTINAL ENDOSCOPY    ? ? ? ?Family History  ?Problem Relation Age of Onset  ? Transient ischemic attack Father 60  ? Bone cancer Father   ? Cancer Father   ? Stroke Father   ? CAD Mother 35  ?     CABG  ? Diabetes Mother   ? COPD Mother   ? Breast cancer Neg Hx   ? Colon cancer Neg Hx   ? Pancreatic cancer Neg Hx   ? Prostate cancer Neg Hx   ? ? ?Social History  ? ?Tobacco Use  ? Smoking status: Former  ?  Packs/day: 1.00  ?  Years: 30.00  ?  Pack years: 30.00  ?  Types: Cigarettes  ?  Quit date: 10/02/2001  ?  Years since quitting: 20.2  ? Smokeless tobacco: Never  ?Vaping Use  ? Vaping Use: Never used  ?Substance Use Topics  ? Alcohol use: Not Currently  ?  Comment: has drank since 2014  ? Drug use: No  ? ? ?Review of Systems:  ? ?Review of Systems  ?Constitutional:  Negative for chills, fever, malaise/fatigue and weight loss.  ?HENT:  Negative for hearing loss, sinus pain and sore throat.   ?Respiratory:  Negative for cough and hemoptysis.   ?Cardiovascular:  Negative for chest pain, palpitations, leg swelling and PND.  ?Gastrointestinal:  Negative for abdominal pain,  constipation, diarrhea, heartburn, nausea and vomiting.  ?Genitourinary:  Negative for dysuria, frequency and urgency.  ?Musculoskeletal:  Negative for back pain, myalgias and neck pain.  ?Skin:  Negative for itching and rash.  ?Neurological:  Negative for dizziness, tingling, seizures and headaches.  ?Endo/Heme/Allergies:  Negative for polydipsia.  ?Psychiatric/Behavioral:  Negative for depression. The patient is not nervous/anxious.   ? ?Objective:  ? ? ?Vitals:  ? 12/19/21 1444  ?Pulse: (!) 53  ?Temp: (!) 97.5 ?F (36.4 ?C)  ?SpO2: 98%  ? ?Body mass index is 29.3 kg/m?. ? ?General  Alert, cooperative, no distress, appears stated age  ?Head:  Normocephalic, without obvious abnormality, atraumatic  ?Eyes:  PERRL, conjunctiva/corneas clear, EOM's intact, fundi  benign, both eyes       ?Ears:  Normal TM's and external ear canals, both ears  ?Nose: Nares normal, septum midline, mucosa normal, no drainage or sinus tenderness  ?Throat: Lips, mucosa, and tongue normal; teeth and gums normal  ?Neck: Supple, symmetrical, trachea midline, no adenopathy;     ?thyroid:  No enlargement/tenderness/nodules; no carotid ?bruit or JVD  ?Back:   Symmetric, no curvature, ROM normal, no CVA tenderness  ?Lungs:   Clear to auscultation bilaterally, respirations unlabored  ?Chest wall:  No tenderness or deformity  ?Heart:  Regular rate and rhythm, S1 and S2 normal, no murmur, rub or gallop  ?Abdomen:   Soft, non-tender, bowel sounds active all four quadrants, no masses, no organomegaly  ?Extremities: Extremities normal, atraumatic, no cyanosis or edema  ?Prostate : Not done.   ?Skin: Skin color, texture, turgor normal, no rashes or lesions  ?Lymph nodes: Cervical, supraclavicular, and axillary nodes normal  ?Neurologic: CNII-XII grossly intact. Normal strength, sensation and reflexes throughout  ? ?AssessmentPlan:  ? ?Encounter for general adult medical examination with abnormal findings ?Today patient counseled on age appropriate routine  health concerns for screening and prevention, each reviewed and up to date or declined. Immunizations reviewed and up to date or declined. Labs ordered and reviewed. Risk factors for depression reviewed and

## 2021-12-20 ENCOUNTER — Encounter: Payer: Self-pay | Admitting: Physician Assistant

## 2021-12-20 LAB — COMPREHENSIVE METABOLIC PANEL
ALT: 16 U/L (ref 0–53)
AST: 16 U/L (ref 0–37)
Albumin: 4.6 g/dL (ref 3.5–5.2)
Alkaline Phosphatase: 60 U/L (ref 39–117)
BUN: 21 mg/dL (ref 6–23)
CO2: 29 mEq/L (ref 19–32)
Calcium: 10 mg/dL (ref 8.4–10.5)
Chloride: 101 mEq/L (ref 96–112)
Creatinine, Ser: 1.38 mg/dL (ref 0.40–1.50)
GFR: 50.26 mL/min — ABNORMAL LOW (ref >60.00)
Glucose, Bld: 99 mg/dL (ref 70–99)
Potassium: 4.9 mEq/L (ref 3.5–5.1)
Sodium: 138 mEq/L (ref 135–145)
Total Bilirubin: 0.5 mg/dL (ref 0.2–1.2)
Total Protein: 7.1 g/dL (ref 6.0–8.3)

## 2021-12-20 LAB — LIPID PANEL
Cholesterol: 116 mg/dL (ref 0–200)
HDL: 49.7 mg/dL (ref >39.00)
NonHDL: 66.6
Total CHOL/HDL Ratio: 2
Triglycerides: 266 mg/dL — ABNORMAL HIGH (ref 0.0–149.0)
VLDL: 53.2 mg/dL — ABNORMAL HIGH (ref 0.0–40.0)

## 2021-12-20 LAB — CBC WITH DIFFERENTIAL/PLATELET
Basophils Absolute: 0 10*3/uL (ref 0.0–0.1)
Basophils Relative: 0.7 % (ref 0.0–3.0)
Eosinophils Absolute: 0 10*3/uL (ref 0.0–0.7)
Eosinophils Relative: 0.7 % (ref 0.0–5.0)
HCT: 35.5 % — ABNORMAL LOW (ref 39.0–52.0)
Hemoglobin: 12.2 g/dL — ABNORMAL LOW (ref 13.0–17.0)
Lymphocytes Relative: 23 % (ref 12.0–46.0)
Lymphs Abs: 1.2 10*3/uL (ref 0.7–4.0)
MCHC: 34.5 g/dL (ref 30.0–36.0)
MCV: 102.4 fl — ABNORMAL HIGH (ref 78.0–100.0)
Monocytes Absolute: 0.5 10*3/uL (ref 0.1–1.0)
Monocytes Relative: 10.7 % (ref 3.0–12.0)
Neutro Abs: 3.3 10*3/uL (ref 1.4–7.7)
Neutrophils Relative %: 64.9 % (ref 43.0–77.0)
Platelets: 190 10*3/uL (ref 150.0–400.0)
RBC: 3.47 Mil/uL — ABNORMAL LOW (ref 4.22–5.81)
RDW: 13.3 % (ref 11.5–15.5)
WBC: 5.1 10*3/uL (ref 4.0–10.5)

## 2021-12-20 LAB — HEMOGLOBIN A1C: Hgb A1c MFr Bld: 6.8 % — ABNORMAL HIGH (ref 4.6–6.5)

## 2021-12-20 LAB — LDL CHOLESTEROL, DIRECT: Direct LDL: 43 mg/dL

## 2021-12-22 ENCOUNTER — Telehealth: Payer: Self-pay | Admitting: Physician Assistant

## 2021-12-22 NOTE — Telephone Encounter (Signed)
Patient is calling for a referral appointment from [place], [place].  Patient is scheduled for 08/02/2022 at 2:00 with [facility], PA-C. ?

## 2021-12-22 NOTE — Telephone Encounter (Signed)
Referral attached to appointment

## 2022-02-06 ENCOUNTER — Other Ambulatory Visit: Payer: Self-pay | Admitting: Cardiology

## 2022-02-14 ENCOUNTER — Encounter: Payer: Self-pay | Admitting: Gastroenterology

## 2022-03-29 ENCOUNTER — Other Ambulatory Visit: Payer: Self-pay | Admitting: Cardiology

## 2022-04-05 DIAGNOSIS — C61 Malignant neoplasm of prostate: Secondary | ICD-10-CM | POA: Diagnosis not present

## 2022-04-05 DIAGNOSIS — N200 Calculus of kidney: Secondary | ICD-10-CM | POA: Diagnosis not present

## 2022-04-05 LAB — PSA: PSA: 0.14

## 2022-04-12 DIAGNOSIS — C61 Malignant neoplasm of prostate: Secondary | ICD-10-CM | POA: Diagnosis not present

## 2022-04-12 DIAGNOSIS — N200 Calculus of kidney: Secondary | ICD-10-CM | POA: Diagnosis not present

## 2022-05-04 ENCOUNTER — Other Ambulatory Visit: Payer: Self-pay | Admitting: Physician Assistant

## 2022-05-06 ENCOUNTER — Other Ambulatory Visit: Payer: Self-pay | Admitting: Cardiology

## 2022-05-06 DIAGNOSIS — I2510 Atherosclerotic heart disease of native coronary artery without angina pectoris: Secondary | ICD-10-CM

## 2022-06-15 ENCOUNTER — Telehealth: Payer: Self-pay | Admitting: Pharmacist

## 2022-06-15 NOTE — Progress Notes (Signed)
Chronic Care Management Pharmacy Assistant   Name: Nicholas Maxwell  MRN: [id number] DOB: [birth year]   Reason for Encounter: Hypertension Adherence Call    Recent office visits:  12/19/2021 [facility] (PCP) [name], PA; no medication changes noted.  Recent consult visits:  None since last PharmD visit  Hospital visits:  None in previous 6 months  Medications: Outpatient Encounter Medications as of 06/15/2022  Medication Sig   alfuzosin (UROXATRAL) 10 MG 24 hr tablet Take 10 mg by mouth daily with supper.   allopurinol (ZYLOPRIM) 300 MG tablet TAKE 1 TABLET (300 MG TOTAL) BY MOUTH DAILY WITH LUNCH.   ASPIRIN 81 81 MG chewable tablet Chew 81 mg by mouth daily.   Blood Glucose Monitoring Suppl (ONE TOUCH ULTRA 2) w/Device KIT USE TO CHECK BLOOD SUGAR   Calcium Carb-Cholecalciferol (CALCIUM 600+D3 PO) Take by mouth.   Cyanocobalamin (B-12) 2500 MCG TABS Take 1 tablet by mouth.   glucose blood (ONETOUCH ULTRA) test strip USE TO CHECK BLOOD SUGAR DAILY AND PRN   metoprolol succinate (TOPROL-XL) 25 MG 24 hr tablet TAKE 1 TABLET BY MOUTH EVERY DAY   nitroGLYCERIN (NITROSTAT) 0.4 MG SL tablet Place 1 tablet (0.4 mg total) under the tongue every 5 (five) minutes x 3 doses as needed for chest pain.   ranolazine (RANEXA) 1000 MG SR tablet TAKE 1 TABLET BY MOUTH TWICE A DAY   REPATHA SURECLICK 425 MG/ML SOAJ INJECT 1ML EVERY 2 WEEKS   No facility-administered encounter medications on file as of 06/15/2022.   Reviewed chart prior to disease state call. Spoke with patient regarding BP  Recent Office Vitals: BP Readings from Last 3 Encounters:  11/21/21 120/68  06/24/21 115/64  06/14/21 110/60   Pulse Readings from Last 3 Encounters:  12/19/21 (!) 53  11/21/21 (!) 58  06/24/21 (!) 56    Wt Readings from Last 3 Encounters:  12/19/21 204 lb 3.2 oz (92.6 kg)  11/21/21 205 lb 6.4 oz (93.2 kg)  06/24/21 202 lb (91.6 kg)     Kidney Function Lab Results  Component Value Date/Time    CREATININE 1.38 12/19/2021 03:30 PM   CREATININE 1.4 (A) 08/30/2021 12:00 AM   CREATININE 1.57 (H) 06/14/2021 09:37 AM   CREATININE 1.41 (H) 09/07/2020 01:42 PM   CREATININE 1.65 (H) 06/08/2020 09:41 AM   GFR 50.26 (L) 12/19/2021 03:30 PM   GFRNONAA 58 (L) 10/21/2019 11:56 AM   GFRNONAA 49 08/21/2018 12:00 AM   GFRAA >60 10/21/2019 11:56 AM       Latest Ref Rng & Units 12/19/2021    3:30 PM 08/30/2021   12:00 AM 06/14/2021    9:37 AM  BMP  Glucose 70 - 99 mg/dL 99   120   BUN 6 - 23 mg/dL '21  22     31   ' Creatinine 0.40 - 1.50 mg/dL 1.38  1.4     1.57   Sodium 135 - 145 mEq/L 138  138     136   Potassium 3.5 - 5.1 mEq/L 4.9  5.1     4.7   Chloride 96 - 112 mEq/L 101  100     100   CO2 19 - 32 mEq/L '29  24     27   ' Calcium 8.4 - 10.5 mg/dL 10.0  9.7     9.9      This result is from an external source.    Current antihypertensive regimen:  Metoprolol Succinate 25 mg  How often are  you checking your Blood Pressure? 1-2x per week  Current home BP readings: 125/70  What recent interventions/DTPs have been made by any provider to improve Blood Pressure control since last CPP Visit: No recent interventions or DTPs.  Any recent hospitalizations or ED visits since last visit with CPP? No  What diet changes have been made to improve Blood Pressure Control?  Patient states he has been trying to lose weight. He states he has been eating healthy foods.  What exercise is being done to improve your Blood Pressure Control?  Patient states he does classes at [facility] and works a lot outdoors in the yard.  Adherence Review: Is the patient currently on ACE/ARB medication? No Does the patient have >5 day gap between last estimated fill dates? No  -Patient states he is in the donut hole with Repatha and it's $450 for a 90 DS. He would like to apply for patient assistance.  Care Gaps: Medicare Annual Wellness: Completed 11/21/2021 Ophthalmology Exam: Next due on 04/25/2022 Foot Exam:  Postponed until 12/12/2021 Hemoglobin A1C: 6.0% on 06/14/2021 Colonoscopy: Next due on 12/05/2021  Future Appointments  Date Time Provider [place]  07/31/2022  1:00 PM Nigel Mormon, MD [facility] None  11/27/2022  3:45 PM [facility] CCM PHARMACIST [facility]  12/04/2022  1:00 PM [facility] HEALTH COACH [facility]   Star Rating Drugs: None  April D Calhoun, [place] Pharmacist Assistant [phone]

## 2022-06-23 ENCOUNTER — Ambulatory Visit: Payer: PPO | Admitting: Cardiology

## 2022-06-23 NOTE — Chronic Care Management (AMB) (Signed)
Repatha through [place] is closed at this time - I can keep him in mind for when it re-opens.  The only other option is through [place].  Also, reading old notes... did he ever resume Jardiance?  I think Aldona Bar had mentioned that after his last labs where A1c was 6.8.  Beverly Milch, PharmD Clinical Pharmacist  [facility] [phone]

## 2022-06-26 ENCOUNTER — Telehealth: Payer: Self-pay | Admitting: Pharmacist

## 2022-06-26 ENCOUNTER — Encounter: Payer: Self-pay | Admitting: *Deleted

## 2022-06-26 NOTE — Progress Notes (Signed)
    Chronic Care Management Pharmacy Assistant   Name: Nicholas Maxwell  MRN: [id number] DOB: Jul 31, 1946   Reason for Encounter: PAP for Repatha   PAP form initiated for Repatha. Will be mailed to patient to complete patient portion and return to prescribing MD office for final portion to be completed by MD and faxed in for patient for processing. Patient will update on the outcome of their application once received.   Jobe Gibbon, [facility] Clinical Pharmacist Assistant  [phone]

## 2022-06-28 DIAGNOSIS — Z0289 Encounter for other administrative examinations: Secondary | ICD-10-CM | POA: Diagnosis not present

## 2022-07-18 ENCOUNTER — Encounter: Payer: Self-pay | Admitting: Physician Assistant

## 2022-07-18 ENCOUNTER — Ambulatory Visit (INDEPENDENT_AMBULATORY_CARE_PROVIDER_SITE_OTHER): Payer: PPO | Admitting: Physician Assistant

## 2022-07-18 VITALS — BP 130/70 | HR 55 | Temp 97.3°F | Ht 70.0 in | Wt 207.5 lb

## 2022-07-18 DIAGNOSIS — Z1283 Encounter for screening for malignant neoplasm of skin: Secondary | ICD-10-CM | POA: Diagnosis not present

## 2022-07-18 DIAGNOSIS — N1832 Chronic kidney disease, stage 3b: Secondary | ICD-10-CM | POA: Diagnosis not present

## 2022-07-18 DIAGNOSIS — G4700 Insomnia, unspecified: Secondary | ICD-10-CM

## 2022-07-18 DIAGNOSIS — Z23 Encounter for immunization: Secondary | ICD-10-CM | POA: Diagnosis not present

## 2022-07-18 DIAGNOSIS — E119 Type 2 diabetes mellitus without complications: Secondary | ICD-10-CM

## 2022-07-18 LAB — COMPREHENSIVE METABOLIC PANEL
ALT: 17 U/L (ref 0–53)
AST: 17 U/L (ref 0–37)
Albumin: 4.4 g/dL (ref 3.5–5.2)
Alkaline Phosphatase: 61 U/L (ref 39–117)
BUN: 22 mg/dL (ref 6–23)
CO2: 29 mEq/L (ref 19–32)
Calcium: 9.8 mg/dL (ref 8.4–10.5)
Chloride: 103 mEq/L (ref 96–112)
Creatinine, Ser: 1.41 mg/dL (ref 0.40–1.50)
GFR: 48.78 mL/min — ABNORMAL LOW (ref >60.00)
Glucose, Bld: 202 mg/dL — ABNORMAL HIGH (ref 70–99)
Potassium: 5 mEq/L (ref 3.5–5.1)
Sodium: 140 mEq/L (ref 135–145)
Total Bilirubin: 0.6 mg/dL (ref 0.2–1.2)
Total Protein: 7.2 g/dL (ref 6.0–8.3)

## 2022-07-18 LAB — MICROALBUMIN / CREATININE URINE RATIO
Creatinine,U: 96.7 mg/dL
Microalb Creat Ratio: 0.8 mg/g (ref 0.0–30.0)
Microalb, Ur: 0.8 mg/dL (ref 0.0–1.9)

## 2022-07-18 LAB — HEMOGLOBIN A1C: Hgb A1c MFr Bld: 6.5 % (ref 4.6–6.5)

## 2022-07-18 NOTE — Progress Notes (Signed)
Nicholas Maxwell is a 76 y.o. male here for a follow up of a pre-existing problem.  History of Present Illness:   Chief Complaint  Patient presents with   Diabetes    HPI  Insomnia He reports that he does not get continuous sleep.  He has tried melatonin 5 mg, and states that it did not help. He sleeps for couple hours and wakes up suddenly. He does not know if he snores. He denies any caffeine intake. He tried his wife's [place] and did not like the way that it made him feel.  Diabetes 6 month follow-up. Current DM meds: none. Denies: hypoglycemic or hyperglycemic episodes or symptoms. This patient's diabetes is complicated by CKD.  Lab Results  Component Value Date   HGBA1C 6.8 (H) 12/19/2021   CKD Stage 2 No longer seeing nephrology. Avoids NSAIDs.  Skin cancer screening He is due for skin cancer screening. His dermatology office closed.  Immunization: He is interested in receiving the influenza vaccine during today's visit. He is not interested in receiving his RSV vaccine and COVID-19 booster.   Past Medical History:  Diagnosis Date   Allergy 12/1998   Seasonal spring and fall. Crestor and statins.   Arthritis    Left thumb   History of kidney stones    Myocardial infarction ([place]) 04/2017   Prostate cancer ([place]) 05/2020   Statin intolerance    Used >2 different statins     Social History   Tobacco Use   Smoking status: Former    Packs/day: 1.00    Years: 30.00    Total pack years: 30.00    Types: Cigarettes    Quit date: 10/02/2001    Years since quitting: 20.8   Smokeless tobacco: Never  Vaping Use   Vaping Use: Never used  Substance Use Topics   Alcohol use: Not Currently    Comment: has not drank since 2014   Drug use: No    Past Surgical History:  Procedure Laterality Date   COLONOSCOPY     March of 2018    CORONARY ANGIOPLASTY WITH STENT PLACEMENT  05/24/2017   CORONARY STENT INTERVENTION N/A 05/24/2017   Procedure: CORONARY STENT INTERVENTION;   Surgeon: Nigel Mormon, MD;  Location: [facility];  Service: Cardiovascular;  Laterality: N/A;  MID Circumflex 4.0x22 Onyx MID LAD 2.75x15 Onyx   CYSTOSCOPY WITH URETEROSCOPY, STONE BASKETRY AND STENT PLACEMENT  ~ [place] PRESSURE WIRE/FFR STUDY N/A 05/24/2017   Procedure: INTRAVASCULAR PRESSURE WIRE/FFR STUDY;  Surgeon: Nigel Mormon, MD;  Location: [facility];  Service: Cardiovascular;  Laterality: N/A;  MID LAD   JOINT REPLACEMENT  10/2019   Right knee   KNEE ARTHROSCOPY Right 2016   LEFT HEART CATH AND CORONARY ANGIOGRAPHY N/A 05/24/2017   Procedure: LEFT HEART CATH AND CORONARY ANGIOGRAPHY;  Surgeon: Nigel Mormon, MD;  Location: [facility];  Service: Cardiovascular;  Laterality: N/A;   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   Procedure: TOTAL KNEE ARTHROPLASTY;  Surgeon: Paralee Cancel, MD;  Location: [facility];  Service: Orthopedics;  Laterality: Right;  70 mins   UMBILICAL HERNIA REPAIR  1992   UPPER GASTROINTESTINAL ENDOSCOPY      Family History  Problem Relation Age of Onset   Transient ischemic attack Father 59   Bone cancer Father    Cancer Father    Stroke Father    CAD Mother 12  CABG   Diabetes Mother    COPD Mother    Breast cancer Neg Hx    Colon cancer Neg Hx    Pancreatic cancer Neg Hx    Prostate cancer Neg Hx     Allergies  Allergen Reactions   Crestor [Rosuvastatin Calcium] Anaphylaxis and Swelling    Tongue swells/throat closes   Statins Other (See Comments)    Muscle/joint pain with ALL other statin drug but Crestor is severe    Current Medications:   Current Outpatient Medications:    alfuzosin (UROXATRAL) 10 MG 24 hr tablet, Take 10 mg by mouth daily with supper., Disp: , Rfl:    allopurinol (ZYLOPRIM) 300 MG tablet, TAKE 1 TABLET (300 MG TOTAL) BY MOUTH DAILY WITH LUNCH., Disp: 90 tablet, Rfl: 1   ASPIRIN 81 81 MG chewable tablet, Chew 81 mg by mouth daily., Disp: ,  Rfl:    Blood Glucose Monitoring Suppl (ONE TOUCH ULTRA 2) w/Device KIT, USE TO CHECK BLOOD SUGAR, Disp: 1 kit, Rfl: 0   Calcium Carb-Cholecalciferol (CALCIUM 600+D3 PO), Take by mouth., Disp: , Rfl:    Cyanocobalamin (B-12) 2500 MCG TABS, Take 1 tablet by mouth., Disp: , Rfl:    glucose blood (ONETOUCH ULTRA) test strip, USE TO CHECK BLOOD SUGAR DAILY AND PRN, Disp: 100 each, Rfl: 4   metoprolol succinate (TOPROL-XL) 25 MG 24 hr tablet, TAKE 1 TABLET BY MOUTH EVERY DAY, Disp: 90 tablet, Rfl: 2   nitroGLYCERIN (NITROSTAT) 0.4 MG SL tablet, Place 1 tablet (0.4 mg total) under the tongue every 5 (five) minutes x 3 doses as needed for chest pain., Disp: 30 tablet, Rfl: 2   ranolazine (RANEXA) 1000 MG SR tablet, TAKE 1 TABLET BY MOUTH TWICE A DAY, Disp: 180 tablet, Rfl: 1   REPATHA SURECLICK 675 MG/ML SOAJ, INJECT 1ML EVERY 2 WEEKS, Disp: 6 mL, Rfl: 3   Review of Systems:   ROS Negative unless otherwise specified per HPI.   Vitals:   Vitals:   07/18/22 0920  BP: 130/70  Pulse: (!) 55  Temp: (!) 97.3 F (36.3 C)  TempSrc: Temporal  SpO2: 96%  Weight: 207 lb 8 oz (94.1 kg)  Height: _0  (1.778 m)     Body mass index is 29.77 kg/m.  Physical Exam:   Physical Exam Constitutional:      General: He is not in acute distress.    Appearance: Normal appearance. He is not ill-appearing.  HENT:     Head: Normocephalic and atraumatic.  Eyes:     Extraocular Movements: Extraocular movements intact.     Pupils: Pupils are equal, round, and reactive to light.  Skin:    General: Skin is warm and dry.  Neurological:     Mental Status: He is alert and oriented to person, place, and time.  Psychiatric:        Judgment: Judgment normal.     Assessment and Plan:   Chronic kidney disease, stage 3b ([place]) Update renal function and urine micro today Follow-up based on results Continue to avoid nephrotoxic agents  Type 2 diabetes mellitus without complication, without long-term current  use of insulin ([facility]) Update A1c and provide recommendations accordingly  Skin cancer screening Referral for dermatology placed today  Insomnia, unspecified type Recommend trialing melatonin 10 mg Consider referral for sleep apnea - he will review with wife and let us know  I,Param Shah,acting as a scribe for [employer], PA.,have documented all relevant documentation on the behalf of [name], PA,as  directed by  [name], PA while in the presence of [name], [place].  I, [place], [place], have reviewed all documentation for this visit. The documentation on 07/18/22 for the exam, diagnosis, procedures, and orders are all accurate and complete.  [name], PA-C

## 2022-07-18 NOTE — Patient Instructions (Addendum)
It was great to see you!  Consider 10 mg melatonin Consider talking to your wife about sleep apnea -- if concerns, lets do a sleep study -- message me  Let's follow-up in 6 months, sooner if you have concerns  Take care,  [name] PA-C

## 2022-07-28 ENCOUNTER — Telehealth: Payer: Self-pay

## 2022-07-28 DIAGNOSIS — Z789 Other specified health status: Secondary | ICD-10-CM

## 2022-07-28 NOTE — Progress Notes (Signed)
[facility])                                            [place] Team                                        Statin Quality Measure Assessment    07/28/2022  Nicholas Maxwell September 23, 1946 [id number]  Per review of chart and payor information, this patient has been flagged for non-adherence to the following CMS Quality Measure:   '[]'$  Statin Use in Persons with Diabetes(SUPD)  '[x]'$  Statin Use in Persons with Cardiovascular Disease(SPC)  The ASCVD Risk score (Arnett DK, et al., 2019) failed to calculate for the following reasons:   The patient has a prior MI or stroke diagnosis  This patient is failing [facility] CMS measure due to history of myalgia d/t statin. Repatha on file. To pass this measure, patient needs an annual code associated to an encounter in 2023. If deemed clinically appropriate, please consider associating exclusion code (see options below) at the next office visit on 07/31/2022.   Please consider ONE of the following recommendations:   Initiate high intensity statin Atorvastatin '40mg'$  once daily, #90, 3 refills   Rosuvastatin '20mg'$  once daily, #90, 3 refills    Initiate moderate intensity          statin with reduced frequency if prior          statin intolerance 1x weekly, #13, 3 refills   2x weekly, #26, 3 refills   3x weekly, #39, 3 refills   Code for past statin intolerance or other exclusions (required annually)  Drug Induced Myopathy G72.0   Myositis, unspecified M60.9   Rhabdomyolysis M62.82   Cirrhosis of liver K74.69   Biliary cirrhosis, unspecified K74.5   Abnormal blood glucose - for SUPD ONLY R73.09   Prediabetes - for SUPD ONLY  R73.03   Polycystic ovarian syndrome E28.2   Adverse effect of antihyperlipidemic and antiarteriosclerotic drugs, initial encounter B04.8G8B   Thank you for your time,  Kristeen Miss, [place] Cell: [phone]

## 2022-07-31 ENCOUNTER — Encounter: Payer: Self-pay | Admitting: Cardiology

## 2022-07-31 ENCOUNTER — Ambulatory Visit: Payer: PPO | Admitting: Cardiology

## 2022-07-31 VITALS — BP 135/73 | HR 51 | Resp 16 | Ht 70.0 in | Wt 209.0 lb

## 2022-07-31 DIAGNOSIS — E782 Mixed hyperlipidemia: Secondary | ICD-10-CM | POA: Diagnosis not present

## 2022-07-31 DIAGNOSIS — I25118 Atherosclerotic heart disease of native coronary artery with other forms of angina pectoris: Secondary | ICD-10-CM

## 2022-07-31 DIAGNOSIS — I10 Essential (primary) hypertension: Secondary | ICD-10-CM | POA: Diagnosis not present

## 2022-07-31 DIAGNOSIS — G720 Drug-induced myopathy: Secondary | ICD-10-CM | POA: Diagnosis not present

## 2022-07-31 DIAGNOSIS — E119 Type 2 diabetes mellitus without complications: Secondary | ICD-10-CM | POA: Diagnosis not present

## 2022-07-31 HISTORY — DX: Drug-induced myopathy: G72.0

## 2022-07-31 MED ORDER — METFORMIN HCL 500 MG PO TABS: 500.0000 mg | ORAL_TABLET | Freq: Two times a day (BID) | ORAL | 3 refills | Status: DC

## 2022-07-31 MED ORDER — LOSARTAN POTASSIUM 25 MG PO TABS: 25.0000 mg | ORAL_TABLET | Freq: Every day | ORAL | 3 refills | Status: DC

## 2022-07-31 NOTE — Progress Notes (Signed)
Patient is here for follow up visit.  Subjective:   Nicholas Maxwell, male    DOB: 01-10-1946, 76 y.o.   MRN: [id number]  Chief Complaint  Patient presents with   Coronary Artery Disease   Follow-up    56 year   76 year old pleasant Caucasian male with hypertension, hyperlipidemia, CAD s/p LAD and Lcx PCI 2018, hypertension, type 2 DM, CKD 3, h/o prostate cancer  Patient is staying active with regular water aerobics. For last month or two, he has noticed more than usual exertional dyspnea with perspiration. Denies any chest pain, but also dd not have chest pain before his diagnosis of CAD, and mostly had dyspnea. Reviewed recent test results with the patient, details below. A1C is elevated at 6.5%.    Current Outpatient Medications:    alfuzosin (UROXATRAL) 10 MG 24 hr tablet, Take 10 mg by mouth daily with supper., Disp: , Rfl:    allopurinol (ZYLOPRIM) 300 MG tablet, TAKE 1 TABLET (300 MG TOTAL) BY MOUTH DAILY WITH LUNCH., Disp: 90 tablet, Rfl: 1   ASPIRIN 81 81 MG chewable tablet, Chew 81 mg by mouth daily., Disp: , Rfl:    Blood Glucose Monitoring Suppl (ONE TOUCH ULTRA 2) w/Device KIT, USE TO CHECK BLOOD SUGAR, Disp: 1 kit, Rfl: 0   Calcium Carb-Cholecalciferol (CALCIUM 600+D3 PO), Take by mouth., Disp: , Rfl:    Cyanocobalamin (B-12) 2500 MCG TABS, Take 1 tablet by mouth., Disp: , Rfl:    glucose blood (ONETOUCH ULTRA) test strip, USE TO CHECK BLOOD SUGAR DAILY AND PRN, Disp: 100 each, Rfl: 4   metoprolol succinate (TOPROL-XL) 25 MG 24 hr tablet, TAKE 1 TABLET BY MOUTH EVERY DAY, Disp: 90 tablet, Rfl: 2   nitroGLYCERIN (NITROSTAT) 0.4 MG SL tablet, Place 1 tablet (0.4 mg total) under the tongue every 5 (five) minutes x 3 doses as needed for chest pain., Disp: 30 tablet, Rfl: 2   ranolazine (RANEXA) 1000 MG SR tablet, TAKE 1 TABLET BY MOUTH TWICE A DAY, Disp: 180 tablet, Rfl: 1   REPATHA SURECLICK 076 MG/ML SOAJ, INJECT 1ML EVERY 2 WEEKS, Disp: 6 mL, Rfl: 3  Cardiovascular  studies:  EKG 07/31/2022: Sinus rhythm 54 bpm Nonspecific T-abnormality  Abdominal aortic duplex 09/11/2018: The maximum aorta diameter is 2.53 cm (mid). Diffuse heterogeneous plaque observed in the proximal, mid and distal aorta.  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  Normal flow velocities noted in aorta and iliac arteries. Rec: Rescan in 5 years if clinically indicated.  Cath 05/24/2017: LM: Normal LAD: 60% mid LAD calcific stenosis FFR +ve 0.77. Mild diffuse disease in small caliber distal LAD  Successful complex PTCA and DES placement Onyx 2.75 X 15 mm Mid LAD     (60%-->0%, TIMI III - TIMI III) Ramus intermedius: Small caliber vessel with 50-70% stenoses LCx: Large dominant vessel with mid circumflex subtotal occlusion with retrograde filling of distal circumflex and PDA Successful PTCA and DES placement Onyx 4.0 X 22 mm Mid left circumflex     (99%-->0%, TIMI I - TIMI III) RCA: Small caliber nondominant vessel with mild diffuse disease  Exercise myoview stress 05/18/2017: 1. The patient performed treadmill exercise using a Bruce protocol, completing 8 minutes. The patient completed an estimated workload of 10.12 METS achieving 85% maximum predicted heart rate. Stress symptoms included 4/10 chest pain and dyspnea.  2. The stress electrocardiogram revealed 1-1.5 mm of horizontal ST depression in lead (s):II, III,  aVF, V4, V5, V6 consistent with myocardial ischemia.   3. This is an abnormal myocardial perfusion imaging study demonstrating a mixture of scar plus ischemia in the basal inferoseptal, basal inferior, basal inferolateral, mid inferoseptal, mid inferior, mid inferolateral and apical inferior myocardial wall(s) (LCx/RCA territory) 4. Left ventricular systolic function was abnormal with regional wall motion abnormalities.  The left ventricular ejection fraction was calculated to be 39%.   5. This is a high risk  study  Echocardiogram 05/15/2017: Left ventricle cavity is normal in size. Left ventricle regional wall motion findings: Basal anterolateral, Basal inferolateral, Mid anterolateral and Mid inferolateral hypokinesis. Calculated EF 55%. Structurally normal mitral valve with moderate (Grade II) regurgitation.  Recent labs: 07/18/2022: Glucose 202, BUN/Cr 22/1.41. EGFR 48. Na/K 140/5.0. Rest of the CMP normal H/H 12/35. MCV 102. Platelets 190 HbA1C 6.5%  11/2021: Chol 138, TG 266, HDL 49, dLDL 43   Review of Systems  Cardiovascular:  Positive for dyspnea on exertion. Negative for chest pain, leg swelling, palpitations and syncope.       Objective:   Vitals:   07/31/22 0852  BP: 135/73  Pulse: (!) 51  Resp: 16  SpO2: 96%     Physical Exam Vitals and nursing note reviewed.  Constitutional:      General: He is not in acute distress. Neck:     Vascular: No JVD.  Cardiovascular:     Rate and Rhythm: Normal rate and regular rhythm.     Heart sounds: Normal heart sounds. No murmur heard. Pulmonary:     Effort: Pulmonary effort is normal.     Breath sounds: Normal breath sounds. No wheezing or rales.  Musculoskeletal:     Right lower leg: No edema.     Left lower leg: No edema.         Assessment & Recommendations:   76 year old pleasant Caucasian male with hypertension, hyperlipidemia, type 2 DM, CAD, CKD 3, h/o prostate cancer s/p radiation.  CAD: S/p multivessel PCI 2018. Recent exertional dyspnea, concerning for angina equivalent.  Continue aspirin, Repatha, metoprolol, Ranexa. Added losartan 25 mg daily due to DM.  Mixed hyperlipidemia H/o statin myopathy. Well controlled on [place].  TG high likely due to DM. Repeat lipid panel in 3 months  Essential hypertension Controlled  Type 2 diabetes mellitus without complication, without long-term current use of insulin ([place]) Not on any therapy. A1C 6.5%.  Discussed diet and exercise. In the past, Vania Rea was  not approved by insurance, per the patient.  Started metformin 500 mg bid and losartan 50 mg daily. Increase hydration. Will monitor BMP.   CKD: Avoid NSAIDS. Encourage hydration. F/u w/PCP  Aortic ectasia: Repeat aorta duplex in 2024   F/u in 3 months     Nigel Mormon, MD Pager: [phone] Office: [phone]

## 2022-08-02 ENCOUNTER — Ambulatory Visit: Payer: PPO | Admitting: Physician Assistant

## 2022-08-07 DIAGNOSIS — C61 Malignant neoplasm of prostate: Secondary | ICD-10-CM | POA: Diagnosis not present

## 2022-08-07 LAB — PSA: PSA: 0.14

## 2022-08-11 DIAGNOSIS — I25118 Atherosclerotic heart disease of native coronary artery with other forms of angina pectoris: Secondary | ICD-10-CM | POA: Diagnosis not present

## 2022-08-12 LAB — LIPID PANEL
Chol/HDL Ratio: 2.1 ratio (ref 0.0–5.0)
Cholesterol, Total: 103 mg/dL (ref 100–199)
HDL: 49 mg/dL (ref >39)
LDL Chol Calc (NIH): 31 mg/dL (ref 0–99)
Triglycerides: 130 mg/dL (ref 0–149)
VLDL Cholesterol Cal: 23 mg/dL (ref 5–40)

## 2022-08-12 LAB — BASIC METABOLIC PANEL
BUN/Creatinine Ratio: 19 (ref 10–24)
BUN: 28 mg/dL — ABNORMAL HIGH (ref 8–27)
CO2: 23 mmol/L (ref 20–29)
Calcium: 9.6 mg/dL (ref 8.6–10.2)
Chloride: 102 mmol/L (ref 96–106)
Creatinine, Ser: 1.49 mg/dL — ABNORMAL HIGH (ref 0.76–1.27)
Glucose: 124 mg/dL — ABNORMAL HIGH (ref 70–99)
Potassium: 4.6 mmol/L (ref 3.5–5.2)
Sodium: 139 mmol/L (ref 134–144)
eGFR: 49 mL/min/{1.73_m2} — ABNORMAL LOW (ref >59)

## 2022-08-14 DIAGNOSIS — R351 Nocturia: Secondary | ICD-10-CM | POA: Diagnosis not present

## 2022-08-14 DIAGNOSIS — N200 Calculus of kidney: Secondary | ICD-10-CM | POA: Diagnosis not present

## 2022-08-14 DIAGNOSIS — E349 Endocrine disorder, unspecified: Secondary | ICD-10-CM | POA: Diagnosis not present

## 2022-08-14 DIAGNOSIS — N403 Nodular prostate with lower urinary tract symptoms: Secondary | ICD-10-CM | POA: Diagnosis not present

## 2022-08-15 ENCOUNTER — Ambulatory Visit: Payer: PPO

## 2022-08-15 DIAGNOSIS — I25118 Atherosclerotic heart disease of native coronary artery with other forms of angina pectoris: Secondary | ICD-10-CM

## 2022-08-16 ENCOUNTER — Ambulatory Visit (INDEPENDENT_AMBULATORY_CARE_PROVIDER_SITE_OTHER): Payer: PPO | Admitting: Physician Assistant

## 2022-08-16 ENCOUNTER — Encounter: Payer: Self-pay | Admitting: Physician Assistant

## 2022-08-16 VITALS — BP 110/68 | HR 60 | Temp 97.5°F | Ht 70.0 in | Wt 204.5 lb

## 2022-08-16 DIAGNOSIS — E119 Type 2 diabetes mellitus without complications: Secondary | ICD-10-CM | POA: Diagnosis not present

## 2022-08-16 DIAGNOSIS — R051 Acute cough: Secondary | ICD-10-CM

## 2022-08-16 DIAGNOSIS — N1832 Chronic kidney disease, stage 3b: Secondary | ICD-10-CM

## 2022-08-16 MED ORDER — DOXYCYCLINE HYCLATE 100 MG PO TABS: 100.0000 mg | ORAL_TABLET | Freq: Two times a day (BID) | ORAL | 0 refills | Status: DC

## 2022-08-16 NOTE — Patient Instructions (Addendum)
It was great to see you!  Upper respiratory infection recommendations for those with current or history of elevated blood pressure: 1. Avoid all over-the-counter antihistamines except Claritin/Loratadine and Zyrtec/Cetrizine. 2. Avoid all combination including cold sinus allergies flu decongestant and sleep medications 3. You can use Robitussin DM Mucinex and Mucinex DM for cough.   Start Flonase (Fluticasone nasal spray) in AM and PM.  Continue fluids.  I have sent in doxycycline for you. I do not think you need this right now. However, if the above plan does not help your symptoms, and your congestion or cough worsens, please go ahead and start this.  Decrease metformin to 500 mg daily.   Take care,  [name] PA-C

## 2022-08-16 NOTE — Progress Notes (Signed)
Nicholas Maxwell is a 76 y.o. male here for a new problem.  History of Present Illness:   Chief Complaint  Patient presents with   Nasal Congestion    Pt c/o nasal congestion started last Friday after raking leaves on Thursday, slight cough. Has been taking Benadryl Allergy & sinus. COVID test Negative.    HPI  Cough Patient reports that he was raking leaves outside his home 6 days ago and then experienced nasal congestion and a slight cough the day after. He took a Covid test yesterday morning that came back negative. He confirms that his ears feel full and drinks enough water. Patient states that he recently had a stress test completed and has not received the test results yet. He reports that his wife is not sick or showing symptoms. He manages his symptoms with Benadryl Allergy and Sinus which he takes anytime of day and states that it doesn't make him sleepy.  He denies fever, chills, and ear pain  Diabetes  Patient report that he recently was put on Metformin 500 mg BID by his cardiologist, Dr. Virgina Jock for his blood sugar.  CKD Stage 3b He has stopped seeing his kidney doctor, as we are taking this over.    Past Medical History:  Diagnosis Date   Allergy 12/1998   Seasonal spring and fall. Crestor and statins.   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   History of kidney stones    Myocardial infarction ([place]) 04/2017   Prostate cancer ([place]) 05/2020   Statin intolerance    Used >2 different statins     Social History   Tobacco Use   Smoking status: Former    Packs/day: 1.00    Years: 30.00    Total pack years: 30.00    Types: Cigarettes    Quit date: 10/02/2001    Years since quitting: 20.8   Smokeless tobacco: Never  Vaping Use   Vaping Use: Never used  Substance Use Topics   Alcohol use: Not Currently    Comment: has not drank since 2014   Drug use: No    Past Surgical History:  Procedure Laterality Date   COLONOSCOPY     March of  2018    CORONARY ANGIOPLASTY WITH STENT PLACEMENT  05/24/2017   CORONARY STENT INTERVENTION N/A 05/24/2017   Procedure: CORONARY STENT INTERVENTION;  Surgeon: Nigel Mormon, MD;  Location: [facility];  Service: Cardiovascular;  Laterality: N/A;  MID Circumflex 4.0x22 Onyx MID LAD 2.75x15 Onyx   CYSTOSCOPY WITH URETEROSCOPY, STONE BASKETRY AND STENT PLACEMENT  ~ [place] PRESSURE WIRE/FFR STUDY N/A 05/24/2017   Procedure: INTRAVASCULAR PRESSURE WIRE/FFR STUDY;  Surgeon: Nigel Mormon, MD;  Location: [facility];  Service: Cardiovascular;  Laterality: N/A;  MID LAD   JOINT REPLACEMENT  10/2019   Right knee   KNEE ARTHROSCOPY Right 2016   LEFT HEART CATH AND CORONARY ANGIOGRAPHY N/A 05/24/2017   Procedure: LEFT HEART CATH AND CORONARY ANGIOGRAPHY;  Surgeon: Nigel Mormon, MD;  Location: [facility];  Service: Cardiovascular;  Laterality: N/A;   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   Procedure: TOTAL KNEE ARTHROPLASTY;  Surgeon: Paralee Cancel, MD;  Location: [facility];  Service: Orthopedics;  Laterality: Right;  70 mins   UMBILICAL HERNIA REPAIR  1992   UPPER GASTROINTESTINAL ENDOSCOPY      Family History  Problem Relation Age of Onset  Transient ischemic attack Father 31   Bone cancer Father    Cancer Father    Stroke Father    CAD Mother 53       CABG   Diabetes Mother    COPD Mother    Breast cancer Neg Hx    Colon cancer Neg Hx    Pancreatic cancer Neg Hx    Prostate cancer Neg Hx     Allergies  Allergen Reactions   Crestor [Rosuvastatin Calcium] Anaphylaxis and Swelling    Tongue swells/throat closes   Statins Other (See Comments)    Muscle/joint pain with ALL other statin drug but Crestor is severe    Current Medications:   Current Outpatient Medications:    allopurinol (ZYLOPRIM) 300 MG tablet, TAKE 1 TABLET (300 MG TOTAL) BY MOUTH DAILY WITH LUNCH., Disp: 90 tablet, Rfl: 1   ASPIRIN 81 81 MG  chewable tablet, Chew 81 mg by mouth daily., Disp: , Rfl:    Blood Glucose Monitoring Suppl (ONE TOUCH ULTRA 2) w/Device KIT, USE TO CHECK BLOOD SUGAR, Disp: 1 kit, Rfl: 0   Calcium Carb-Cholecalciferol (CALCIUM 600+D3 PO), Take by mouth., Disp: , Rfl:    Cyanocobalamin (B-12) 2500 MCG TABS, Take 1 tablet by mouth., Disp: , Rfl:    glucose blood (ONETOUCH ULTRA) test strip, USE TO CHECK BLOOD SUGAR DAILY AND PRN, Disp: 100 each, Rfl: 4   losartan (COZAAR) 25 MG tablet, Take 1 tablet (25 mg total) by mouth daily., Disp: 30 tablet, Rfl: 3   metFORMIN (GLUCOPHAGE) 500 MG tablet, Take 1 tablet (500 mg total) by mouth 2 (two) times daily with a meal., Disp: 60 tablet, Rfl: 3   metoprolol succinate (TOPROL-XL) 25 MG 24 hr tablet, TAKE 1 TABLET BY MOUTH EVERY DAY, Disp: 90 tablet, Rfl: 2   nitroGLYCERIN (NITROSTAT) 0.4 MG SL tablet, Place 1 tablet (0.4 mg total) under the tongue every 5 (five) minutes x 3 doses as needed for chest pain., Disp: 30 tablet, Rfl: 2   ranolazine (RANEXA) 1000 MG SR tablet, TAKE 1 TABLET BY MOUTH TWICE A DAY, Disp: 180 tablet, Rfl: 1   REPATHA SURECLICK 322 MG/ML SOAJ, INJECT 1ML EVERY 2 WEEKS, Disp: 6 mL, Rfl: 3   Review of Systems:   Review of Systems  Constitutional:  Negative for chills and fever.  HENT:  Positive for congestion (nasal). Negative for ear pain.   Respiratory:  Positive for cough.     Vitals:   Vitals:   08/16/22 0802  BP: 110/68  Pulse: 60  Temp: (!) 97.5 F (36.4 C)  TempSrc: Temporal  SpO2: 98%  Weight: 204 lb 8 oz (92.8 kg)  Height: _0  (1.778 m)     Body mass index is 29.34 kg/m.  Physical Exam:   Physical Exam Vitals and nursing note reviewed.  Constitutional:      General: He is not in acute distress.    Appearance: Normal appearance. He is well-developed. He is not ill-appearing or toxic-appearing.  HENT:     Head: Normocephalic and atraumatic.     Right Ear: Ear canal and external ear normal. A middle ear effusion is  present. Tympanic membrane is not erythematous, retracted or bulging.     Left Ear: Ear canal and external ear normal. A middle ear effusion is present. Tympanic membrane is not erythematous, retracted or bulging.     Nose: Nose normal.     Right Sinus: No maxillary sinus tenderness or frontal sinus tenderness.  Left Sinus: No maxillary sinus tenderness or frontal sinus tenderness.     Mouth/Throat:     Pharynx: Uvula midline. No posterior oropharyngeal erythema.  Eyes:     General: Lids are normal.     Extraocular Movements: Extraocular movements intact.     Conjunctiva/sclera: Conjunctivae normal.     Pupils: Pupils are equal, round, and reactive to light.  Neck:     Trachea: Trachea normal.  Cardiovascular:     Rate and Rhythm: Normal rate and regular rhythm.     Heart sounds: Normal heart sounds, S1 normal and S2 normal. No murmur heard.    No gallop.  Pulmonary:     Effort: Pulmonary effort is normal. No respiratory distress.     Breath sounds: Normal breath sounds. No decreased breath sounds, wheezing, rhonchi or rales.  Lymphadenopathy:     Cervical: No cervical adenopathy.  Skin:    General: Skin is warm and dry.  Neurological:     Mental Status: He is alert and oriented to person, place, and time.  Psychiatric:        Speech: Speech normal.        Behavior: Behavior normal. Behavior is cooperative.        Judgment: Judgment normal.     Assessment and Plan:   Type 2 diabetes mellitus without complication, without long-term current use of insulin (HCC) Well controlled Discussed metformin 500 mg risk/benefit with hx of CKD Stage 3b and CAD. Will continue metformin 500 mg but have him decrease to daily use and recheck blood work in 3 months to assess renal function  Acute cough No red flags on discussion, patient is not in any obvious distress during our visit. Discussed progression of most viral illness, and recommended supportive care at this point in time.  Start  daily claritin vs zyrtec and regular use of flonase I did however provide pocket rx for oral doxycycline should symptoms not improve as anticipated. Discussed over the counter supportive care options, with recommendations to push fluids and rest. Reviewed return precautions including new/worsening fever, SOB, new/worsening cough or other concerns.  Recommended need to self-quarantine and practice social distancing until symptoms resolve. Discussed current recommendations for COVID testing. I recommend that patient follow-up if symptoms worsen or persist despite treatment x 7-10 days, sooner if needed.  Chronic kidney disease, stage 3b ([place]) Continue efforts to maintain healthy kidneys Push fluids Recheck kidney function in 3 months  I,Verona Buck,acting as a Education administrator for [employer], PA.,have documented all relevant documentation on the behalf of [name], PA,as directed by  [name], PA while in the presence of [name], [place].  I, [name], [place], have reviewed all documentation for this visit. The documentation on 08/16/22 for the exam, diagnosis, procedures, and orders are all accurate and complete.   [name], PA-C

## 2022-09-10 ENCOUNTER — Other Ambulatory Visit: Payer: Self-pay | Admitting: Cardiology

## 2022-09-10 DIAGNOSIS — E119 Type 2 diabetes mellitus without complications: Secondary | ICD-10-CM

## 2022-09-12 ENCOUNTER — Telehealth: Payer: Self-pay | Admitting: Pharmacist

## 2022-09-12 NOTE — Progress Notes (Signed)
Chronic Care Management Pharmacy Assistant   Name: Nicholas Maxwell  MRN: [id number] DOB: 1945-12-06   Reason for Encounter: Hypertension Adherence Call    Recent office visits:  07/18/2022 OV (PCP) [name], PA; no medication changes indicated.  Recent consult visits:  07/31/2022 OV (Cardiology) Nigel Mormon, MD; Added losartan 25 mg daily due to DM. Started metformin 500 mg bid and losartan 50 mg daily.   Hospital visits:  None in previous 6 months  Medications: Outpatient Encounter Medications as of 09/12/2022  Medication Sig   allopurinol (ZYLOPRIM) 300 MG tablet TAKE 1 TABLET (300 MG TOTAL) BY MOUTH DAILY WITH LUNCH.   ASPIRIN 81 81 MG chewable tablet Chew 81 mg by mouth daily.   Blood Glucose Monitoring Suppl (ONE TOUCH ULTRA 2) w/Device KIT USE TO CHECK BLOOD SUGAR   Calcium Carb-Cholecalciferol (CALCIUM 600+D3 PO) Take by mouth.   Cyanocobalamin (B-12) 2500 MCG TABS Take 1 tablet by mouth.   doxycycline (VIBRA-TABS) 100 MG tablet Take 1 tablet (100 mg total) by mouth 2 (two) times daily.   glucose blood (ONETOUCH ULTRA) test strip USE TO CHECK BLOOD SUGAR DAILY AND PRN   losartan (COZAAR) 25 MG tablet Take 1 tablet (25 mg total) by mouth daily.   metFORMIN (GLUCOPHAGE) 500 MG tablet Take 1 tablet (500 mg total) by mouth 2 (two) times daily with a meal.   metoprolol succinate (TOPROL-XL) 25 MG 24 hr tablet TAKE 1 TABLET BY MOUTH EVERY DAY   nitroGLYCERIN (NITROSTAT) 0.4 MG SL tablet Place 1 tablet (0.4 mg total) under the tongue every 5 (five) minutes x 3 doses as needed for chest pain.   ranolazine (RANEXA) 1000 MG SR tablet TAKE 1 TABLET BY MOUTH TWICE A DAY   REPATHA SURECLICK 410 MG/ML SOAJ INJECT 1ML EVERY 2 WEEKS   No facility-administered encounter medications on file as of 09/12/2022.   Reviewed chart prior to disease state call. Spoke with patient regarding BP  Recent Office Vitals: BP Readings from Last 3 Encounters:  08/16/22 110/68   07/31/22 135/73  07/18/22 130/70   Pulse Readings from Last 3 Encounters:  08/16/22 60  07/31/22 (!) 51  07/18/22 (!) 55    Wt Readings from Last 3 Encounters:  08/16/22 204 lb 8 oz (92.8 kg)  07/31/22 209 lb (94.8 kg)  07/18/22 207 lb 8 oz (94.1 kg)     Kidney Function Lab Results  Component Value Date/Time   CREATININE 1.49 (H) 08/11/2022 10:04 AM   CREATININE 1.41 07/18/2022 09:43 AM   CREATININE 1.41 (H) 09/07/2020 01:42 PM   CREATININE 1.65 (H) 06/08/2020 09:41 AM   GFR 48.78 (L) 07/18/2022 09:43 AM   GFRNONAA 58 (L) 10/21/2019 11:56 AM   GFRNONAA 49 08/21/2018 12:00 AM   GFRAA >60 10/21/2019 11:56 AM       Latest Ref Rng & Units 08/11/2022   10:04 AM 07/18/2022    9:43 AM 12/19/2021    3:30 PM  BMP  Glucose 70 - 99 mg/dL 124  202  99   BUN 8 - 27 mg/dL _0 Creatinine 0.76 - 1.27 mg/dL 1.49  1.41  1.38   BUN/Creat Ratio 10 - 24 19     Sodium 134 - 144 mmol/L 139  140  138   Potassium 3.5 - 5.2 mmol/L 4.6  5.0  4.9   Chloride 96 - 106 mmol/L 102  103  101   CO2 20 - 29 mmol/L 23  29  29   Calcium 8.6 - 10.2 mg/dL 9.6  9.8  10.0     Current antihypertensive regimen:  Losartan 25 mg daily Metoprolol Succinate 25 mg daily  How often are you checking your Blood Pressure? 1-2x per week  Current home BP readings: 110/60  What recent interventions/DTPs have been made by any provider to improve Blood Pressure control since last CPP Visit: Patient recently started Losartan 25 mg daily.  Any recent hospitalizations or ED visits since last visit with CPP? No  What diet changes have been made to improve Blood Pressure Control?  Patient states he tries to eat a good variety of foods.  What exercise is being done to improve your Blood Pressure Control?  Patient states he does deep water aerobics twice a week.  Adherence Review: Is the patient currently on ACE/ARB medication? Yes Does the patient have >5 day gap between last estimated fill dates?  No   Care Gaps: Medicare Annual Wellness: Completed 11/21/2021 Hemoglobin A1C: 6.5% 07/18/2022 Colonoscopy: Completed 12/05/2016  Future Appointments  Date Time Provider Nitro  11/06/2022  9:30 AM Patwardhan, Reynold Bowen, MD [facility] None  11/17/2022  9:00 AM [name], PA [facility]  11/27/2022  3:45 PM [facility] CCM PHARMACIST [facility]  12/04/2022  1:00 PM [facility] HEALTH COACH [facility]   Star Rating Drugs: Losartan 25 mg last filled 07/31/2022 90 DS Metformin 500 mg last filled 07/31/2022 90 DS  April D Calhoun, [place] Pharmacist Assistant [phone]

## 2022-09-29 ENCOUNTER — Other Ambulatory Visit: Payer: Self-pay | Admitting: Cardiology

## 2022-10-26 DIAGNOSIS — H903 Sensorineural hearing loss, bilateral: Secondary | ICD-10-CM | POA: Diagnosis not present

## 2022-10-27 DIAGNOSIS — N1832 Chronic kidney disease, stage 3b: Secondary | ICD-10-CM | POA: Diagnosis not present

## 2022-10-27 LAB — BASIC METABOLIC PANEL
BUN: 24 — AB (ref 4–21)
CO2: 22 (ref 13–22)
Chloride: 98 — AB (ref 99–108)
Creatinine: 1.7 — AB (ref 0.6–1.3)
Glucose: 147
Potassium: 4.7 mEq/L (ref 3.5–5.1)
Sodium: 135 — AB (ref 137–147)

## 2022-10-27 LAB — COMPREHENSIVE METABOLIC PANEL
Albumin: 4.5 (ref 3.5–5.0)
Calcium: 9.3 (ref 8.7–10.7)
eGFR: 43

## 2022-10-27 LAB — PROTEIN / CREATININE RATIO, URINE
Albumin, U: 7.6
Creatinine, Urine: 126.5

## 2022-10-27 LAB — HEMOGLOBIN A1C: Hemoglobin A1C: 11.2

## 2022-10-27 LAB — MICROALBUMIN / CREATININE URINE RATIO: Microalb Creat Ratio: 6

## 2022-11-01 ENCOUNTER — Other Ambulatory Visit: Payer: Self-pay | Admitting: Physician Assistant

## 2022-11-06 ENCOUNTER — Ambulatory Visit: Payer: PPO | Admitting: Cardiology

## 2022-11-06 ENCOUNTER — Encounter: Payer: Self-pay | Admitting: Cardiology

## 2022-11-06 VITALS — BP 109/64 | HR 58 | Ht 70.0 in | Wt 207.0 lb

## 2022-11-06 DIAGNOSIS — I10 Essential (primary) hypertension: Secondary | ICD-10-CM | POA: Diagnosis not present

## 2022-11-06 DIAGNOSIS — I25118 Atherosclerotic heart disease of native coronary artery with other forms of angina pectoris: Secondary | ICD-10-CM

## 2022-11-06 DIAGNOSIS — E782 Mixed hyperlipidemia: Secondary | ICD-10-CM | POA: Diagnosis not present

## 2022-11-06 NOTE — Progress Notes (Signed)
Patient is here for follow up visit.  Subjective:   Nicholas Maxwell, male    DOB: 1946/09/05, 77 y.o.   MRN: [id number]  Chief Complaint  Patient presents with   Coronary artery disease involving native coronary artery of   Mixed Hyperlipidemia   Essential hypertension   Follow-up   77 year old pleasant Caucasian male with hypertension, hyperlipidemia, CAD s/p LAD and Lcx PCI 2018, hypertension, type 2 DM, CKD 3, h/o prostate cancer  No change in mild exertional dyspnea symptoms. He is able to continue his water aerobics without much difficulty.    Current Outpatient Medications:    allopurinol (ZYLOPRIM) 300 MG tablet, TAKE 1 TABLET BY MOUTH DAILY WITH LUNCH., Disp: 90 tablet, Rfl: 1   ASPIRIN 81 81 MG chewable tablet, Chew 81 mg by mouth daily., Disp: , Rfl:    Blood Glucose Monitoring Suppl (ONE TOUCH ULTRA 2) w/Device KIT, USE TO CHECK BLOOD SUGAR, Disp: 1 kit, Rfl: 0   Calcium Carb-Cholecalciferol (CALCIUM 600+D3 PO), Take by mouth., Disp: , Rfl:    Cyanocobalamin (B-12) 2500 MCG TABS, Take 1 tablet by mouth., Disp: , Rfl:    doxycycline (VIBRA-TABS) 100 MG tablet, Take 1 tablet (100 mg total) by mouth 2 (two) times daily., Disp: 14 tablet, Rfl: 0   glucose blood (ONETOUCH ULTRA) test strip, USE TO CHECK BLOOD SUGAR DAILY AND PRN, Disp: 100 each, Rfl: 4   losartan (COZAAR) 25 MG tablet, TAKE 1 TABLET (25 MG TOTAL) BY MOUTH DAILY., Disp: 90 tablet, Rfl: 1   metFORMIN (GLUCOPHAGE) 500 MG tablet, TAKE 1 TABLET BY MOUTH 2 TIMES DAILY WITH A MEAL., Disp: 180 tablet, Rfl: 1   metoprolol succinate (TOPROL-XL) 25 MG 24 hr tablet, TAKE 1 TABLET BY MOUTH EVERY DAY, Disp: 90 tablet, Rfl: 2   nitroGLYCERIN (NITROSTAT) 0.4 MG SL tablet, Place 1 tablet (0.4 mg total) under the tongue every 5 (five) minutes x 3 doses as needed for chest pain., Disp: 30 tablet, Rfl: 2   ranolazine (RANEXA) 1000 MG SR tablet, TAKE 1 TABLET BY MOUTH TWICE A DAY, Disp: 180 tablet, Rfl: 1   REPATHA SURECLICK  962 MG/ML SOAJ, INJECT 1ML EVERY 2 WEEKS, Disp: 6 mL, Rfl: 3  Cardiovascular studies:  Echocardiogram 08/15/2022: Left ventricle cavity is normal in size. Moderate concentric hypertrophy of the left ventricle. Normal global wall motion. Normal LV systolic function with EF 60%. Doppler evidence of grade I (impaired) diastolic dysfunction, normal LAP. 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 Normal right atrial pressure. Unlike previous study in 2018, wall motion abnormalities, mod MR not appreciated on this study.  Exercise nuclear stress test 08/15/2022: Myocardial perfusion is normal. Overall LV systolic function is normal without regional wall motion abnormalities. Stress LV EF: 55%. Low risk study. Normal ECG stress. The patient exercised for 6 minutes and 3 seconds of a Bruce protocol, achieving approximately 7.1 METs and 88% MPHR. The heart rate response was normal. The blood pressure response was normal. No previous exam available for comparison.   EKG 07/31/2022: Sinus rhythm 54 bpm Nonspecific T-abnormality  Abdominal aortic duplex 09/11/2018: The maximum aorta diameter is 2.53 cm (mid). Diffuse heterogeneous plaque observed in the proximal, mid and distal aorta.  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  Normal flow velocities noted in aorta and iliac arteries. Rec: Rescan in 5 years if clinically indicated.  Cath 05/24/2017: LM: Normal LAD: 60% mid  LAD calcific stenosis FFR +ve 0.77. Mild diffuse disease in small caliber distal LAD  Successful complex PTCA and DES placement Onyx 2.75 X 15 mm Mid LAD     (60%-->0%, TIMI III - TIMI III) Ramus intermedius: Small caliber vessel with 50-70% stenoses LCx: Large dominant vessel with mid circumflex subtotal occlusion with retrograde filling of distal circumflex and PDA Successful PTCA and DES placement Onyx 4.0 X 22 mm Mid left circumflex     (99%-->0%,  TIMI I - TIMI III) RCA: Small caliber nondominant vessel with mild diffuse disease  Exercise myoview stress 05/18/2017: 1. The patient performed treadmill exercise using a Bruce protocol, completing 8 minutes. The patient completed an estimated workload of 10.12 METS achieving 85% maximum predicted heart rate. Stress symptoms included 4/10 chest pain and dyspnea.  2. The stress electrocardiogram revealed 1-1.5 mm of horizontal ST depression in lead (s):II, III, aVF, V4, V5, V6 consistent with myocardial ischemia.   3. This is an abnormal myocardial perfusion imaging study demonstrating a mixture of scar plus ischemia in the basal inferoseptal, basal inferior, basal inferolateral, mid inferoseptal, mid inferior, mid inferolateral and apical inferior myocardial wall(s) (LCx/RCA territory) 4. Left ventricular systolic function was abnormal with regional wall motion abnormalities.  The left ventricular ejection fraction was calculated to be 39%.   5. This is a high risk study  Echocardiogram 05/15/2017: Left ventricle cavity is normal in size. Left ventricle regional wall motion findings: Basal anterolateral, Basal inferolateral, Mid anterolateral and Mid inferolateral hypokinesis. Calculated EF 55%. Structurally normal mitral valve with moderate (Grade II) regurgitation.  Recent labs: 08/11/2022: Chol 103, TG 130, HDL 49, LDL 31  07/18/2022: Glucose 202, BUN/Cr 22/1.41. EGFR 48. Na/K 140/5.0. Rest of the CMP normal H/H 12/35. MCV 102. Platelets 190 HbA1C 6.5%  11/2021: Chol 138, TG 266, HDL 49, dLDL 43   Review of Systems  Cardiovascular:  Positive for dyspnea on exertion. Negative for chest pain, leg swelling, palpitations and syncope.       Objective:   Vitals:   11/06/22 0937  BP: 109/64  Pulse: (!) 58  SpO2: 97%     Physical Exam Vitals and nursing note reviewed.  Constitutional:      General: He is not in acute distress. Neck:     Vascular: No JVD.  Cardiovascular:      Rate and Rhythm: Normal rate and regular rhythm.     Heart sounds: Normal heart sounds. No murmur heard. Pulmonary:     Effort: Pulmonary effort is normal.     Breath sounds: Normal breath sounds. No wheezing or rales.  Musculoskeletal:     Right lower leg: No edema.     Left lower leg: No edema.         Assessment & Recommendations:   77 year old pleasant Caucasian male with hypertension, hyperlipidemia, type 2 DM, CAD, CKD 3, h/o prostate cancer s/p radiation.  CAD: S/p multivessel PCI 2018. No ischemia on stress testing (08/2022). No specific etiology identified for mild exertional dyspnea. If symptoms get worse, will consider cardiopulmonary exercise testing. Recent exertional dyspnea, concerning for angina equivalent.  Continue aspirin, Repatha, metoprolol, Ranexa, losartan 25 mg daily.  Mixed hyperlipidemia H/o statin myopathy. Well controlled on [place].   Essential hypertension Controlled  Type 2 diabetes mellitus without complication, without long-term current use of insulin (HCC) Continue metformin 500 mg bid.  Aortic ectasia: Repeat aorta duplex in 2024 (will order at next visit)   F/u in 6 months     Shaela Boer J  Virgina Jock, MD Pager: [phone] Office: [phone]

## 2022-11-13 DIAGNOSIS — Z8546 Personal history of malignant neoplasm of prostate: Secondary | ICD-10-CM | POA: Diagnosis not present

## 2022-11-13 DIAGNOSIS — E349 Endocrine disorder, unspecified: Secondary | ICD-10-CM | POA: Diagnosis not present

## 2022-11-13 LAB — PSA: PSA: 0.12

## 2022-11-17 ENCOUNTER — Ambulatory Visit (INDEPENDENT_AMBULATORY_CARE_PROVIDER_SITE_OTHER): Payer: PPO | Admitting: Physician Assistant

## 2022-11-17 ENCOUNTER — Encounter: Payer: Self-pay | Admitting: Physician Assistant

## 2022-11-17 VITALS — BP 130/72 | HR 58 | Temp 97.7°F | Ht 70.0 in | Wt 208.5 lb

## 2022-11-17 DIAGNOSIS — E119 Type 2 diabetes mellitus without complications: Secondary | ICD-10-CM

## 2022-11-17 DIAGNOSIS — E1122 Type 2 diabetes mellitus with diabetic chronic kidney disease: Secondary | ICD-10-CM | POA: Diagnosis not present

## 2022-11-17 DIAGNOSIS — N1832 Chronic kidney disease, stage 3b: Secondary | ICD-10-CM | POA: Diagnosis not present

## 2022-11-17 LAB — POCT GLYCOSYLATED HEMOGLOBIN (HGB A1C): Hemoglobin A1C: 6.3 % — AB (ref 4.0–5.6)

## 2022-11-17 NOTE — Patient Instructions (Signed)
It was great to see you!  Keep up the good work  Let's follow-up in 6 months, sooner if you have concerns.  Take care,  [name] PA-C

## 2022-11-17 NOTE — Progress Notes (Addendum)
Nicholas Maxwell is a 77 y.o. male here for a follow up of a pre-existing problem.  History of Present Illness:   Chief Complaint  Patient presents with   Diabetes    HPI  Diabetes 3 month follow-up. Current DM meds: 500 mg metformin daily. Blood sugars at home are: overall controlled. Denies: hypoglycemic or hyperglycemic episodes or symptoms. This patient's diabetes is complicated by CKD.  Lab Results  Component Value Date   HGBA1C 6.3 (A) 11/17/2022    CKD  Had blood work done by another provider GFR around 43 Avoiding nephrotoxins No longer seeing nephrology  Past Medical History:  Diagnosis Date   Allergy 12/1998   Seasonal spring and fall. Crestor and statins.   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   History of kidney stones    Myocardial infarction ([place]) 04/2017   Prostate cancer ([place]) 05/2020   Statin intolerance    Used >2 different statins     Social History   Tobacco Use   Smoking status: Former    Packs/day: 1.00    Years: 30.00    Total pack years: 30.00    Types: Cigarettes    Quit date: 10/02/2001    Years since quitting: 21.1   Smokeless tobacco: Never  Vaping Use   Vaping Use: Never used  Substance Use Topics   Alcohol use: Not Currently    Comment: has not drank since 2014   Drug use: No    Past Surgical History:  Procedure Laterality Date   COLONOSCOPY     March of 2018    CORONARY ANGIOPLASTY WITH STENT PLACEMENT  05/24/2017   CORONARY STENT INTERVENTION N/A 05/24/2017   Procedure: CORONARY STENT INTERVENTION;  Surgeon: Nigel Mormon, MD;  Location: [facility];  Service: Cardiovascular;  Laterality: N/A;  MID Circumflex 4.0x22 Onyx MID LAD 2.75x15 Onyx   CYSTOSCOPY WITH URETEROSCOPY, STONE BASKETRY AND STENT PLACEMENT  ~ [place] PRESSURE WIRE/FFR STUDY N/A 05/24/2017   Procedure: INTRAVASCULAR PRESSURE WIRE/FFR STUDY;  Surgeon: Nigel Mormon, MD;  Location:  [facility];  Service: Cardiovascular;  Laterality: N/A;  MID LAD   JOINT REPLACEMENT  10/2019   Right knee   KNEE ARTHROSCOPY Right 2016   LEFT HEART CATH AND CORONARY ANGIOGRAPHY N/A 05/24/2017   Procedure: LEFT HEART CATH AND CORONARY ANGIOGRAPHY;  Surgeon: Nigel Mormon, MD;  Location: [facility];  Service: Cardiovascular;  Laterality: N/A;   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   Procedure: TOTAL KNEE ARTHROPLASTY;  Surgeon: Paralee Cancel, MD;  Location: [facility];  Service: Orthopedics;  Laterality: Right;  70 mins   UMBILICAL HERNIA REPAIR  1992   UPPER GASTROINTESTINAL ENDOSCOPY      Family History  Problem Relation Age of Onset   Transient ischemic attack Father 68   Bone cancer Father    Cancer Father    Stroke Father    CAD Mother 53       CABG   Diabetes Mother    COPD Mother    Breast cancer Neg Hx    Colon cancer Neg Hx    Pancreatic cancer Neg Hx    Prostate cancer Neg Hx     Allergies  Allergen Reactions   Crestor [Rosuvastatin Calcium] Anaphylaxis and Swelling    Tongue swells/throat closes   Statins Other (See Comments)    Muscle/joint pain with ALL other statin drug  but Crestor is severe    Current Medications:   Current Outpatient Medications:    ALFUZOSIN HCL ER PO, Take 10 mg by mouth daily in the afternoon. Prescribed by Dr. Jeffie Pollock, Disp: , Rfl:    allopurinol (ZYLOPRIM) 300 MG tablet, TAKE 1 TABLET BY MOUTH DAILY WITH LUNCH., Disp: 90 tablet, Rfl: 1   ASPIRIN 81 81 MG chewable tablet, Chew 81 mg by mouth daily., Disp: , Rfl:    Blood Glucose Monitoring Suppl (ONE TOUCH ULTRA 2) w/Device KIT, USE TO CHECK BLOOD SUGAR, Disp: 1 kit, Rfl: 0   Calcium Carb-Cholecalciferol (CALCIUM 600+D3 PO), Take by mouth., Disp: , Rfl:    Cyanocobalamin (B-12) 2500 MCG TABS, Take 1 tablet by mouth., Disp: , Rfl:    glucose blood (ONETOUCH ULTRA) test strip, USE TO CHECK BLOOD SUGAR DAILY AND PRN, Disp: 100 each, Rfl: 4   losartan (COZAAR) 25 MG  tablet, TAKE 1 TABLET (25 MG TOTAL) BY MOUTH DAILY., Disp: 90 tablet, Rfl: 1   metFORMIN (GLUCOPHAGE) 500 MG tablet, Take 500 mg by mouth daily with breakfast., Disp: , Rfl:    metoprolol succinate (TOPROL-XL) 25 MG 24 hr tablet, TAKE 1 TABLET BY MOUTH EVERY DAY, Disp: 90 tablet, Rfl: 2   nitroGLYCERIN (NITROSTAT) 0.4 MG SL tablet, Place 1 tablet (0.4 mg total) under the tongue every 5 (five) minutes x 3 doses as needed for chest pain., Disp: 30 tablet, Rfl: 2   ranolazine (RANEXA) 1000 MG SR tablet, TAKE 1 TABLET BY MOUTH TWICE A DAY, Disp: 180 tablet, Rfl: 1   REPATHA SURECLICK [id number] MG/ML SOAJ, INJECT 1ML EVERY 2 WEEKS, Disp: 6 mL, Rfl: 3   Review of Systems:   ROS  Vitals:   Vitals:   11/17/22 0907  BP: 130/72  Pulse: (!) 58  Temp: 97.7 F (36.5 C)  TempSrc: Temporal  SpO2: 99%  Weight: 208 lb 8 oz (94.6 kg)  Height: '5\' 10"'$  (1.778 m)     Body mass index is 29.92 kg/m.  Physical Exam:   Physical Exam Vitals and nursing note reviewed.  Constitutional:      General: He is not in acute distress.    Appearance: Normal appearance. He is well-developed. He is not ill-appearing or toxic-appearing.  HENT:     Head: Normocephalic and atraumatic.     Right Ear: External ear normal.     Left Ear: External ear normal.  Eyes:     Extraocular Movements: Extraocular movements intact.     Pupils: Pupils are equal, round, and reactive to light.  Cardiovascular:     Rate and Rhythm: Normal rate and regular rhythm.     Pulses: Normal pulses.     Heart sounds: Normal heart sounds, S1 normal and S2 normal. No murmur heard.    No gallop.  Pulmonary:     Effort: Pulmonary effort is normal. No respiratory distress.     Breath sounds: Normal breath sounds. No wheezing or rales.  Skin:    General: Skin is warm and dry.  Neurological:     Mental Status: He is alert and oriented to person, place, and time.     GCS: GCS eye subscore is 4. GCS verbal subscore is 5. GCS motor subscore is 6.   Psychiatric:        Speech: Speech normal.        Behavior: Behavior normal. Behavior is cooperative.        Judgment: Judgment normal.    Results for orders placed or  performed in visit on 11/17/22  Microalbumin / creatinine urine ratio  Result Value Ref Range   Microalb Creat Ratio 6   Protein / creatinine ratio, urine  Result Value Ref Range   Creatinine, Urine 126.5    Albumin, U 7.6   Basic metabolic panel  Result Value Ref Range   Glucose 147    BUN 24 (A) 4 - 21   CO2 22 13 - 22   Creatinine 1.7 (A) 0.6 - 1.3   Potassium 4.7 3.5 - 5.1 mEq/L   Sodium 135 (A) 137 - 147   Chloride 98 (A) 99 - 108  Comprehensive metabolic panel  Result Value Ref Range   eGFR 43    Calcium 9.3 8.7 - 10.7   Albumin 4.5 3.5 - 5.0  Hemoglobin A1c  Result Value Ref Range   Hemoglobin A1C 11.2   Results for orders placed or performed in visit on 11/17/22  POCT glycosylated hemoglobin (Hb A1C)  Result Value Ref Range   Hemoglobin A1C 6.3 (A) 4.0 - 5.6 %    Assessment and Plan:   Type 2 diabetes mellitus without complication, without long-term current use of insulin (HCC) A1c is stable Continue current supportive care and 500 mg metformin daily No additional management needed at this time Follow-up in 6 months  Chronic kidney disease, stage 3b ([place]) No red flags on exam Reviewed most recent renal panel  Continue to monitor at least q 6 mo  I,Verona Buck,acting as a Education administrator for [employer], PA.,have documented all relevant documentation on the behalf of [name], PA,as directed by  [name], PA while in the presence of [name], [place].  I, [name], [place], have reviewed all documentation for this visit. The documentation on 11/17/22 for the exam, diagnosis, procedures, and orders are all accurate and complete.   [name], PA-C

## 2022-11-20 DIAGNOSIS — Z8546 Personal history of malignant neoplasm of prostate: Secondary | ICD-10-CM | POA: Diagnosis not present

## 2022-11-20 DIAGNOSIS — R351 Nocturia: Secondary | ICD-10-CM | POA: Diagnosis not present

## 2022-11-20 DIAGNOSIS — N401 Enlarged prostate with lower urinary tract symptoms: Secondary | ICD-10-CM | POA: Diagnosis not present

## 2022-11-24 ENCOUNTER — Telehealth: Payer: Self-pay | Admitting: Pharmacist

## 2022-11-24 NOTE — Progress Notes (Signed)
Care Management & Coordination Services Pharmacy Team  Reason for Encounter: Appointment Reminder  Contacted patient to confirm telephone appointment with Leata Mouse, PharmD on 11/24/2022 at 12 pm. Spoke with patient on 11/24/2022    Star Rating Drugs:  Losartan 25 mg last filled 10/25/2022 90 DS Metformin 500 mg last filled 10/25/2022 90 DS   Care Gaps: Annual wellness visit in last year? No Overdue since 11/21/2022, has scheduled 12/04/2022  If Diabetic: Last eye exam / retinopathy screening: 04/25/2021 Last diabetic foot exam: 11/20/2018   Future Appointments  Date Time Provider [place]  11/27/2022 12:00 PM Edythe Clarity, [place] None  12/04/2022  1:00 PM [facility] HEALTH COACH [facility]  05/07/2023  9:30 AM Custovic, Collene Mares, DO [facility] None  05/23/2023  9:00 AM [name], PA [facility]   April D Calhoun, [place] Pharmacist Assistant [phone]

## 2022-11-27 ENCOUNTER — Ambulatory Visit: Payer: PPO | Admitting: Pharmacist

## 2022-11-27 NOTE — Progress Notes (Signed)
Care Management & Coordination Services Pharmacy Note  11/27/2022 Name:  Nicholas Maxwell MRN:  [id number] DOB:  01-17-1946  Summary: PharmD Fu visit.  Patient doing well overall.  A1c continues to trend down.  BP controlled with losartan '25mg'$ .  He continues to work on [employer].  No changes needed for meds  Recommendations/Changes made from today's visit: No changes at this time  Follow up plan: FU 12 months   Subjective: Nicholas Maxwell is an 77 y.o. year old male who is a primary patient of [name], [place].  The care coordination team was consulted for assistance with disease management and care coordination needs.    Engaged with patient by telephone for follow up visit.  Recent office visits:  07/18/2022 OV (PCP) [name], PA; no medication changes indicated.   Recent consult visits:  07/31/2022 [facility] (Cardiology) Nigel Mormon, MD; Added losartan 25 mg daily due to DM. Started metformin 500 mg bid and losartan 50 mg daily.    Hospital visits:  None in previous 6 months   Objective:  Lab Results  Component Value Date   CREATININE 1.7 (A) 10/27/2022   BUN 24 (A) 10/27/2022   GFR 48.78 (L) 07/18/2022   EGFR 43 10/27/2022   GFRNONAA 58 (L) 10/21/2019   GFRAA >60 10/21/2019   NA 135 (A) 10/27/2022   K 4.7 10/27/2022   CALCIUM 9.3 10/27/2022   CO2 22 10/27/2022   GLUCOSE 124 (H) 08/11/2022    Lab Results  Component Value Date/Time   HGBA1C 6.3 (A) 11/17/2022 09:19 AM   HGBA1C 11.2 10/27/2022 12:00 AM   HGBA1C 6.5 07/18/2022 09:43 AM   HGBA1C 6.8 (H) 12/19/2021 03:30 PM   HGBA1C 5.3 08/21/2018 12:00 AM   GFR 48.78 (L) 07/18/2022 09:43 AM   GFR 50.26 (L) 12/19/2021 03:30 PM   MICROALBUR 0.8 07/18/2022 09:43 AM   MICROALBUR <0.7 06/14/2021 09:37 AM    Last diabetic Eye exam:  Lab Results  Component Value Date/Time   HMDIABEYEEXA No Retinopathy 04/25/2021 12:00 AM    Last diabetic Foot exam: No results found for: "HMDIABFOOTEX"   Lab  Results  Component Value Date   CHOL 103 08/11/2022   HDL 49 08/11/2022   LDLCALC 31 08/11/2022   LDLDIRECT 43.0 12/19/2021   TRIG 130 08/11/2022   CHOLHDL 2.1 08/11/2022       Latest Ref Rng & Units 10/27/2022   12:00 AM 07/18/2022    9:43 AM 12/19/2021    3:30 PM  Hepatic Function  Total Protein 6.0 - 8.3 g/dL  7.2  7.1   Albumin 3.5 - 5.0 4.5     4.4  4.6   AST 0 - 37 U/L  17  16   ALT 0 - 53 U/L  17  16   Alk Phosphatase 39 - 117 U/L  61  60   Total Bilirubin 0.2 - 1.2 mg/dL  0.6  0.5      This result is from an external source.    Lab Results  Component Value Date/Time   TSH 2.05 08/21/2018 12:00 AM       Latest Ref Rng & Units 12/19/2021    3:30 PM 09/07/2020    1:42 PM 02/06/2020   11:03 AM  CBC  WBC 4.0 - 10.5 K/uL 5.1  5.6  5.6   Hemoglobin 13.0 - 17.0 g/dL 12.2  12.1  12.8   Hematocrit 39.0 - 52.0 % 35.5  34.6  37.8   Platelets 150.0 -  400.0 K/uL 190.0  218  215.0     Lab Results  Component Value Date/Time   VITAMINB12 471 01/21/2020 11:00 AM   VITAMINB12 104 (L) 12/11/2019 09:11 AM    Clinical ASCVD: Yes  The ASCVD Risk score (Arnett DK, et al., 2019) failed to calculate for the following reasons:   The patient has a prior MI or stroke diagnosis        11/17/2022    9:13 AM 11/21/2021    1:10 PM 11/15/2020    1:56 PM  Depression screen PHQ 2/9  Decreased Interest 0 0 0  Down, Depressed, Hopeless 0 0 0  PHQ - 2 Score 0 0 0     Social History   Tobacco Use  Smoking Status Former   Packs/day: 1.00   Years: 30.00   Total pack years: 30.00   Types: Cigarettes   Quit date: 10/02/2001   Years since quitting: 21.1  Smokeless Tobacco Never   BP Readings from Last 3 Encounters:  11/17/22 130/72  11/06/22 109/64  08/16/22 110/68   Pulse Readings from Last 3 Encounters:  11/17/22 (!) 58  11/06/22 (!) 58  08/16/22 60   Wt Readings from Last 3 Encounters:  11/17/22 208 lb 8 oz (94.6 kg)  11/06/22 207 lb (93.9 kg)  08/16/22 204 lb 8 oz (92.8  kg)   BMI Readings from Last 3 Encounters:  11/17/22 29.92 kg/m  11/06/22 29.70 kg/m  08/16/22 29.34 kg/m    Allergies  Allergen Reactions   Crestor [Rosuvastatin Calcium] Anaphylaxis and Swelling    Tongue swells/throat closes   Statins Other (See Comments)    Muscle/joint pain with ALL other statin drug but Crestor is severe    Medications Reviewed Today     Reviewed by Edythe Clarity, [facility] (Pharmacist) on 11/27/22 at 1244  Med List Status: <None>   Medication Order Taking? Sig Documenting Provider Last Dose Status Informant  ALFUZOSIN HCL ER PO [id number] Yes Take 10 mg by mouth daily in the afternoon. Prescribed by Dr. Jeffie Pollock [provider] Taking Active   allopurinol (ZYLOPRIM) 300 MG tablet [id number] Yes TAKE 1 TABLET BY MOUTH DAILY WITH LUNCH. [name], [place] Taking Active   ASPIRIN 81 81 MG chewable tablet [id number] Yes Chew 81 mg by mouth daily. [provider] Taking Active   Blood Glucose Monitoring Suppl (ONE TOUCH ULTRA 2) w/Device KIT [id number] Yes USE TO CHECK BLOOD SUGAR [name], PA Taking Active   Calcium Carb-Cholecalciferol (CALCIUM 600+D3 PO) [id number] Yes Take by mouth. [provider] Taking Active   Cyanocobalamin (B-12) 2500 MCG TABS [id number] Yes Take 1 tablet by mouth. [provider] Taking Active   glucose blood (ONETOUCH ULTRA) test strip [id number] Yes USE TO CHECK BLOOD SUGAR DAILY AND PRN [name], PA Taking Active   losartan (COZAAR) 25 MG tablet [id number] Yes TAKE 1 TABLET (25 MG TOTAL) BY MOUTH DAILY. Patwardhan, Reynold Bowen, MD Taking Active   metFORMIN (GLUCOPHAGE) 500 MG tablet [id number] Yes Take 500 mg by mouth daily with breakfast. [provider] Taking Active   metoprolol succinate (TOPROL-XL) 25 MG 24 hr tablet [id number] Yes TAKE 1 TABLET BY MOUTH EVERY DAY Patwardhan, Manish J, MD Taking Active   nitroGLYCERIN (NITROSTAT) 0.4 MG SL tablet [id number] Yes Place 1 tablet (0.4 mg  total) under the tongue every 5 (five) minutes x 3 doses as needed for chest pain. Nigel Mormon, MD Taking Active   ranolazine (RANEXA) 1000 MG SR tablet [id number] Yes  TAKE 1 TABLET BY MOUTH TWICE A DAY Patwardhan, Reynold Bowen, MD Taking Active   REPATHA SURECLICK [id number] MG/ML Darden Palmer [id number] Yes INJECT 1ML EVERY 2 WEEKS Patwardhan, Reynold Bowen, MD Taking Active             SDOH:  (Social Determinants of Health) assessments and interventions performed: Yes  Financial Resource Strain: Low Risk  (11/27/2022)   Overall Financial Resource Strain (CARDIA)    Difficulty of Paying Living Expenses: Not hard at all   Food Insecurity: No Food Insecurity (11/27/2022)   Hunger Vital Sign    Worried About Running Out of Food in the Last Year: Never true    Ran Out of Food in the Last Year: Never true      Medication Assistance: None required.  Patient affirms current coverage meets needs.  Medication Access: Within the past 30 days, how often has patient missed a dose of medication? 0 Is a pillbox or other method used to improve adherence? Yes  Factors that may affect medication adherence? no barriers identified Are meds synced by current pharmacy? No  Are meds delivered by current pharmacy? No  Does patient experience delays in picking up medications due to transportation concerns? No   [facility] Reviewed: Is patient disadvantaged to use [facility]?: Yes  Current Rx insurance plan: HTA Name and location of Current pharmacy:  [id number] [place], [place] - [address] [place] [address]Phone: [phone]: [phone] [facility] services reviewed with patient today?: Yes  Patient requests to transfer care to [facility]?: No  Reason patient declined to change pharmacies: Disadvantaged due to insurance/mail order  Compliance/Adherence/Medication fill history: Losartan 25 mg last  filled 10/25/2022 90 DS Metformin 500 mg last filled 10/25/2022 90 DS     Care Gaps: Annual wellness visit in last year? No Overdue since 11/21/2022, has scheduled 12/04/2022   If Diabetic: Last eye exam / retinopathy screening: 04/25/2021 Last diabetic foot exam: 11/20/2018   Assessment/Plan     Hypertension (BP goal <130/80) 11/27/22 -controlled -Current treatment: Metoprolol succinate 25 mg once daily Appropriate, Effective, Safe, Accessible Ranolazine 500 mg every 12 hours Appropriate, Effective, Safe, Accessible Losartan '25mg'$  Appropriate, Effective, Safe, Accessible -Medications previously tried: ramipril 5 mg once daily  -Current home readings: normal, no logs discussed, normal in office -Current dietary habits: low sodium -Current exercise habits: [employer] deep water aerobics multiple times per week.  -Denies hypotensive/hypertensive symptoms -Educated on BP goals and benefits of medications for prevention of heart attack, stroke and kidney damage; Daily salt intake goal < 2300 mg; Exercise goal of 150 minutes per week; Importance of home blood pressure monitoring; -Recently added losartan '25mg'$ , BP has been controlled since.  Denies dizziness or HA.  NO need for changes at this time.  Continue to monitor and report consistent readings > 130/80 to providers.   Hyperlipidemia: (LDL goal < 70) -controlled -Current treatment: Repatha 140 mg injection every 2 weeks -Medications previously tried: statin intolerant/allergy  -Current dietary patterns: see diabetes -Current exercise habits: see diabetes -Educated on Cholesterol goals;  -Counseled on diet and exercise extensively Recommended to continue current medication  Diabetes (A1c goal <7%) 11/27/22 -controlled, most recent A1c is 6.3% -Current medications: Metformin '500mg'$  Appropriate, Effective, Safe, Accessible -Medications previously tried: metformin  -Current home glucose readings fasting glucose: n/a post prandial  glucose: n/a -Denies hypoglycemic/hyperglycemic symptoms -Current meal patterns:  breakfast: cereal, maybe egg, small  piece of bacon, oatmeal, pancakes  lunch: toss salad  dinner: minimal sandwiches. Fish, tuna. Green vegetables, raw fruits. snacks: dry roasted peanuts, minimal chips  drinks: no coffee, apple cider or orange juice -Current exercise: [employer] deep water aerobics three times -Meal patterns similar, he did try to behave himself over the holidays.  Was started on metformin last year.  Currently ok with kidney function, will continue to monitor.  At this time I would not make any changes to DM regimen.  A1c is trending in the right direction.        Beverly Milch, PharmD Clinical Pharmacist  [facility] [phone]

## 2022-12-07 ENCOUNTER — Ambulatory Visit (INDEPENDENT_AMBULATORY_CARE_PROVIDER_SITE_OTHER): Payer: PPO

## 2022-12-07 VITALS — BP 118/58 | HR 72 | Wt 211.0 lb

## 2022-12-07 DIAGNOSIS — Z0000 Encounter for general adult medical examination without abnormal findings: Secondary | ICD-10-CM | POA: Diagnosis not present

## 2022-12-07 NOTE — Progress Notes (Signed)
Subjective:   Nicholas Maxwell is a 77 y.o. male who presents for Medicare Annual/Subsequent preventive examination.   Patient Medicare AWV questionnaire was completed by the patient on 12/06/22; I have confirmed that all information answered by patient is correct and no changes since this date.       Review of Systems     Cardiac Risk Factors include: advanced age (>42mn, >>60women);diabetes mellitus;dyslipidemia;hypertension;obesity (BMI >30kg/m2);male gender     Objective:    Today's Vitals   12/07/22 1355  BP: (!) 118/58  Pulse: 72  SpO2: 94%  Weight: 211 lb (95.7 kg)   Body mass index is 30.28 kg/m.     12/07/2022    2:04 PM 11/21/2021    1:11 PM 11/24/2020    8:38 AM 11/15/2020    1:58 PM 06/29/2020    4:32 PM 10/21/2019    9:55 AM 08/25/2019    9:42 AM  Advanced Directives  Does Patient Have a Medical Advance Directive? Yes Yes Yes Yes Yes Yes Yes  Type of ACorporate treasurerof AGratiotLiving will Healthcare Power of [place]and[place] will Living will Living will;Healthcare Power of Attorney  Does patient want to make changes to medical advance directive?     No - Patient declined  No - Patient declined  Copy of [place] Chart?  Yes - validated most recent copy scanned in chart (See row information)  Yes - validated most recent copy scanned in chart (See row information) No - copy requested  No - copy requested  Would patient like information on creating a medical advance directive? Yes (MAU/Ambulatory/Procedural Areas - Information given)          Current Medications (verified) Outpatient Encounter Medications as of 12/07/2022  Medication Sig   ALFUZOSIN HCL ER PO Take 10 mg by mouth daily in the afternoon. Prescribed by Dr. WJeffie Pollock  allopurinol (ZYLOPRIM) 300 MG tablet TAKE 1 TABLET BY MOUTH DAILY WITH LUNCH.   ASPIRIN 81 81 MG chewable tablet Chew 81 mg by mouth daily.    Blood Glucose Monitoring Suppl (ONE TOUCH ULTRA 2) w/Device KIT USE TO CHECK BLOOD SUGAR   Calcium Carb-Cholecalciferol (CALCIUM 600+D3 PO) Take by mouth.   Cyanocobalamin (B-12) 2500 MCG TABS Take 1 tablet by mouth.   glucose blood (ONETOUCH ULTRA) test strip USE TO CHECK BLOOD SUGAR DAILY AND PRN   losartan (COZAAR) 25 MG tablet TAKE 1 TABLET (25 MG TOTAL) BY MOUTH DAILY.   metFORMIN (GLUCOPHAGE) 500 MG tablet Take 500 mg by mouth daily with breakfast.   metoprolol succinate (TOPROL-XL) 25 MG 24 hr tablet TAKE 1 TABLET BY MOUTH EVERY DAY   nitroGLYCERIN (NITROSTAT) 0.4 MG SL tablet Place 1 tablet (0.4 mg total) under the tongue every 5 (five) minutes x 3 doses as needed for chest pain.   ranolazine (RANEXA) 1000 MG SR tablet TAKE 1 TABLET BY MOUTH TWICE A DAY   REPATHA SURECLICK 1XX123456MG/ML SOAJ INJECT 1ML EVERY 2 WEEKS   No facility-administered encounter medications on file as of 12/07/2022.    Allergies (verified) Crestor [rosuvastatin calcium] and Statins   History: Past Medical History:  Diagnosis Date   Allergy 12/1998   Seasonal spring and fall. Crestor and statins.   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   Essential hypertension 05/12/2015   History of kidney stones    Myocardial infarction ([facility] 04/2017   Prostate  cancer ([place]) 05/2020   Statin intolerance    Used >2 different statins   Type 2 diabetes mellitus without complication, without long-term current use of insulin ([place]) 05/12/2015   Past Surgical History:  Procedure Laterality Date   COLONOSCOPY     March of 2018    CORONARY ANGIOPLASTY WITH STENT PLACEMENT  05/24/2017   CORONARY STENT INTERVENTION N/A 05/24/2017   Procedure: CORONARY STENT INTERVENTION;  Surgeon: Nigel Mormon, MD;  Location: [place] [facility];  Service: Cardiovascular;  Laterality: N/A;  MID Circumflex 4.0x22 Onyx MID LAD 2.75x15 Onyx   CYSTOSCOPY WITH URETEROSCOPY, STONE BASKETRY AND STENT PLACEMENT  ~ [place] PRESSURE WIRE/FFR STUDY N/A 05/24/2017   Procedure: INTRAVASCULAR PRESSURE WIRE/FFR STUDY;  Surgeon: Nigel Mormon, MD;  Location: [place] [facility];  Service: Cardiovascular;  Laterality: N/A;  MID LAD   JOINT REPLACEMENT  10/2019   Right knee   KNEE ARTHROSCOPY Right 2016   LEFT HEART CATH AND CORONARY ANGIOGRAPHY N/A 05/24/2017   Procedure: LEFT HEART CATH AND CORONARY ANGIOGRAPHY;  Surgeon: Nigel Mormon, MD;  Location: [place] [facility];  Service: Cardiovascular;  Laterality: N/A;   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   Procedure: TOTAL KNEE ARTHROPLASTY;  Surgeon: Paralee Cancel, MD;  Location: [facility];  Service: Orthopedics;  Laterality: Right;  70 mins   UMBILICAL HERNIA REPAIR  1992   UPPER GASTROINTESTINAL ENDOSCOPY     Family History  Problem Relation Age of Onset   Transient ischemic attack Father 62   Bone cancer Father    Cancer Father    Stroke Father    CAD Mother 78       CABG   Diabetes Mother    COPD Mother    Breast cancer Neg Hx    Colon cancer Neg Hx    Pancreatic cancer Neg Hx    Prostate cancer Neg Hx    Social History   Socioeconomic History   Marital status: Married    Spouse name: Not on file   Number of children: 2   Years of education: Not on file   Highest education level: Not on file  Occupational History   Occupation: retired    Comment: retired  Tobacco Use   Smoking status: Former    Packs/day: 1.00    Years: 30.00    Total pack years: 30.00    Types: Cigarettes    Quit date: 10/02/2001    Years since quitting: 21.1   Smokeless tobacco: Never  Vaping Use   Vaping Use: Never used  Substance and Sexual Activity   Alcohol use: Not Currently    Comment: has not drank since 2014   Drug use: No   Sexual activity: Not Currently  Other Topics Concern   Not on file  Social History Narrative   Married   Son and daughter   Lynnette Caffey   Worked in Research officer, trade union   Social Determinants  of Health   Financial Resource Strain: [place]  (12/06/2022)   Overall Financial Resource Strain (CARDIA)    Difficulty of Paying Living Expenses: Not hard at all  Food Insecurity: No Food Insecurity (12/06/2022)   Hunger Vital Sign    Worried About Running Out of Food in the Last Year: Never true    Ran Out of Food in the Last Year: Never true  Transportation Needs: No Transportation Needs (12/06/2022)   PRAPARE - Transportation    Lack of  Transportation (Medical): No    Lack of Transportation (Non-Medical): No  Physical Activity: Sufficiently Active (12/06/2022)   Exercise Vital Sign    Days of Exercise per Week: 4 days    Minutes of Exercise per Session: 60 min  Stress: No Stress Concern Present (12/06/2022)   [place]    Feeling of Stress : Not at all  Social Connections: [place] (12/06/2022)   Social Connection and Isolation Panel [NHANES]    Frequency of Communication with Friends and Family: More than three times a week    Frequency of Social Gatherings with Friends and Family: More than three times a week    Attends Religious Services: More than 4 times per year    Active Member of [employer] or Organizations: Yes    Attends Music therapist: More than 4 times per year    Marital Status: Married    Tobacco Counseling Counseling given: Not Answered   Clinical Intake:  Pre-visit preparation completed: Yes  Pain : No/denies pain     BMI - recorded: 30.28 Nutritional Status: BMI > 30  Obese Nutritional Risks: None Diabetes: Yes CBG done?: No Did pt. bring in CBG monitor from home?: No  How often do you need to have someone help you when you read instructions, pamphlets, or other written materials from your doctor or pharmacy?: 1 - Never  Diabetic?Nutrition Risk Assessment:  Has the patient had any N/V/D within the last 2 months?  No  Does the patient have any non-healing wounds?  No   Has the patient had any unintentional weight loss or weight gain?  No   Diabetes:  Is the patient diabetic?  Yes  If diabetic, was a CBG obtained today?  No  Did the patient bring in their glucometer from home?  No  How often do you monitor your CBG's? N/a.   Financial Strains and Diabetes Management:  Are you having any financial strains with the device, your supplies or your medication? No .  Does the patient want to be seen by Chronic Care Management for management of their diabetes?  No  Would the patient like to be referred to a Nutritionist or for Diabetic Management?  No   Diabetic Exams:  Diabetic Eye Exam: Overdue for diabetic eye exam. Pt has been advised about the importance in completing this exam. Patient advised to call and schedule an eye exam. Diabetic Foot Exam: Overdue, Pt has been advised about the importance in completing this exam. Pt is scheduled for diabetic foot exam on next appt .  Interpreter Needed?: No  Information entered by :: Charlott Rakes, LPN   Activities of Daily Living    12/06/2022    4:48 PM 11/30/2022    7:34 PM  In your present state of health, do you have any difficulty performing the following activities:  Hearing? 0 0  Vision? 0 0  Difficulty concentrating or making decisions? 0 0  Walking or climbing stairs? 0 0  Dressing or bathing? 0 0  Doing errands, shopping? 0 0  Preparing Food and eating ? N N  Using the Toilet? N N  In the past six months, have you accidently leaked urine? N N  Do you have problems with loss of bowel control? N N  Managing your Medications? N N  Managing your Finances? N N  Housekeeping or managing your Housekeeping? N N    Patient Care Team: [name], [place] as PCP -  General (Physician Assistant) [name], MD as Attending Physician (Urology) Newell Coral., MD as Referring Physician (Gastroenterology) Nigel Mormon, MD as Consulting Physician (Cardiology) Lenard Simmer,  [facility] (Gastroenterology) Paralee Cancel, MD as Consulting Physician (Orthopedic Surgery) Edythe Clarity, [facility] (Pharmacist) [facility] as Consulting Physician (Optometry)  Indicate any recent Medical Services you may have received from other than [facility] providers in the past year (date may be approximate).     Assessment:   This is a routine wellness examination for [place].  Hearing/Vision screen Hearing Screening - Comments:: Pt wears hearing aids  Vision Screening - Comments:: Pt follows up with [facility] in [place] for annual eye exams   Dietary issues and exercise activities discussed: Current Exercise Habits: Home exercise routine, Type of exercise: Other - see comments, Time (Minutes): 60, Frequency (Times/Week): 4, Weekly Exercise (Minutes/Week): 240   Goals Addressed             This Visit's Progress    Patient Stated       Lose some weight        Depression Screen    12/07/2022    2:03 PM 11/17/2022    9:13 AM 11/21/2021    1:10 PM 11/15/2020    1:56 PM 09/07/2020    1:06 PM 08/25/2019   10:21 AM 05/28/2019    9:50 AM  PHQ 2/9 Scores  PHQ - 2 Score 0 0 0 0 0 0 0    Fall Risk    12/06/2022    4:48 PM 11/30/2022    7:34 PM 12/19/2021    2:44 PM 11/21/2021    1:12 PM 11/15/2020    1:59 PM  [place] in the past year? 0 0 0 0 0  Number falls in past yr: 0 0 0 0 0  Injury with Fall? 0 0 0 0 0  Risk for fall due to : Impaired vision  No Fall Risks Impaired vision Impaired vision  Follow up Falls prevention discussed   Falls prevention discussed Falls prevention discussed    FALL RISK PREVENTION PERTAINING TO THE HOME:  Any stairs in or around the home? Yes  If so, are there any without handrails? No  Home free of loose throw rugs in walkways, pet beds, electrical cords, etc? Yes  Adequate lighting in your home to reduce risk of falls? Yes   ASSISTIVE DEVICES UTILIZED TO PREVENT FALLS:  Life alert? No  Use of a cane, walker or w/c?  No  Grab bars in the bathroom? Yes  Shower chair or bench in shower? Yes  Elevated toilet seat or a handicapped toilet? No   TIMED UP AND GO:  Was the test performed? Yes .  Length of time to ambulate 10 feet: 10 sec.   Gait steady and fast without use of assistive device  Cognitive Function:        12/07/2022    2:09 PM 11/21/2021    1:14 PM 11/15/2020    2:02 PM 08/25/2019   10:20 AM  6CIT Screen  What Year? 0 points 0 points 0 points 0 points  What month? 0 points 0 points 0 points 0 points  What time? 0 points 0 points  0 points  Count back from 20 0 points 0 points 0 points 0 points  Months in reverse 0 points 0 points 0 points 0 points  Repeat phrase 0 points 0 points 0 points 0 points  Total Score 0 points 0  points  0 points    Immunizations Immunization History  Administered Date(s) Administered   Fluad Quad(high Dose 65+) 07/10/2016, 06/27/2017, 05/28/2019, 06/08/2020, 06/14/2021, 07/18/2022   Influenza, High Dose Seasonal PF 07/10/2016, 06/27/2017, 06/11/2018   Influenza-Unspecified 07/05/2010, 07/17/2011, 07/08/2012, 06/24/2013, 07/21/2014, 06/28/2015, 07/10/2016, 06/27/2017   Pneumococcal Conjugate-13 11/09/2016   Pneumococcal Polysaccharide-23 10/07/2012   Tdap 02/22/2018   Zoster, Live 05/16/2013    TDAP status: Up to date  Flu Vaccine status: Up to date  Pneumococcal vaccine status: Up to date  Covid-19 vaccine status: Declined, Education has been provided regarding the importance of this vaccine but patient still declined. Advised may receive this vaccine at local pharmacy or Health Dept.or vaccine clinic. Aware to provide a copy of the vaccination record if obtained from local pharmacy or Health Dept. Verbalized acceptance and understanding.  Qualifies for Shingles Vaccine? Yes   Zostavax completed No   Shingrix Completed?: No.    Education has been provided regarding the importance of this vaccine. Patient has been advised to call insurance company  to determine out of pocket expense if they have not yet received this vaccine. Advised may also receive vaccine at local pharmacy or Health Dept. Verbalized acceptance and understanding.  Screening Tests Health Maintenance  Topic Date Due   OPHTHALMOLOGY EXAM  04/25/2022   COLONOSCOPY (Pts 45-68yr Insurance coverage will need to be confirmed)  12/20/2022 (Originally 12/05/2021)   FOOT EXAM  01/17/2023 (Originally 11/21/2019)   Zoster Vaccines- Shingrix (1 of 2) 07/19/2023 (Originally 02/20/1965)   HEMOGLOBIN A1C  05/18/2023   Diabetic kidney evaluation - eGFR measurement  10/28/2023   Diabetic kidney evaluation - Urine ACR  10/28/2023   Medicare Annual Wellness (AWV)  12/07/2023   DTaP/Tdap/Td (2 - Td or Tdap) 02/23/2028   Pneumonia Vaccine 77 Years old  Completed   INFLUENZA VACCINE  Completed   Hepatitis C Screening  Completed   HPV VACCINES  Aged Out   COVID-19 Vaccine  Discontinued    Health Maintenance  Health Maintenance Due  Topic Date Due   OPHTHALMOLOGY EXAM  04/25/2022    Colorectal cancer screening: Type of screening: Colonoscopy. Completed 12/05/16. Repeat every 5 years   Additional Screening:  Hepatitis C Screening: Completed 08/21/18  Vision Screening: Recommended annual ophthalmology exams for early detection of glaucoma and other disorders of the eye. Is the patient up to date with their annual eye exam?  No  Who is the provider or what is the name of the office in which the patient attends annual eye exams? [facility]enton [facility]  If pt is not established with a provider, would they like to be referred to a provider to establish care? No .   Dental Screening: Recommended annual dental exams for proper oral hygiene  Community Resource Referral / Chronic Care Management: CRR required this visit?  No   CCM required this visit?  No      Plan:     I have personally reviewed and noted the following in the patient's chart:   Medical and social history Use of  alcohol, tobacco or illicit drugs  Current medications and supplements including opioid prescriptions. Patient is not currently taking opioid prescriptions. Functional ability and status Nutritional status Physical activity Advanced directives List of other physicians Hospitalizations, surgeries, and ER visits in previous 12 months Vitals Screenings to include cognitive, depression, and falls Referrals and appointments  In addition, I have reviewed and discussed with patient certain preventive protocols, quality metrics, and best practice recommendations. A  written personalized care plan for preventive services as well as general preventive health recommendations were provided to patient.     Willette Brace, LPN   [id number]   Nurse Notes: none

## 2022-12-07 NOTE — Patient Instructions (Signed)
Nicholas Maxwell , Thank you for taking time to come for your Medicare Wellness Visit. I appreciate your ongoing commitment to your health goals. Please review the following plan we discussed and let me know if I can assist you in the future.   These are the goals we discussed:  Goals      DIET - EAT MORE FRUITS AND VEGETABLES     Lifestyle Change-Hypertension     Timeframe:  Long-Range Goal Priority:  Medium Start Date: 11/08/2020                            Expected End Date: 11/08/2021  Follow Up Date 08/23    - agree to work together to make changes - ask questions to understand    Why is this important?   The changes that you are asked to make may be hard to do.  This is especially true when the changes are life-long.  Knowing why it is important to you is the first step.  Working on the change with your family or support person helps you not feel alone.  Reward yourself and family or support person when goals are met. This can be an activity you choose like bowling, hiking, biking, swimming or shooting hoops.        Patient Stated     Lose 10 lbs      Patient Stated     Get some weight off     Track and Manage My Blood Pressure-Hypertension     Timeframe:  Long-Range Goal Priority:  High Start Date:   11/22/21                          Expected End Date:  05/22/22                     Follow Up Date 02/19/22    - check blood pressure 3 times per week - choose a place to take my blood pressure (home, clinic or office, retail store) - write blood pressure results in a log or diary    Why is this important?   You won't feel high blood pressure, but it can still hurt your blood vessels.  High blood pressure can cause heart or kidney problems. It can also cause a stroke.  Making lifestyle changes like losing a little weight or eating less salt will help.  Checking your blood pressure at home and at different times of the day can help to control blood pressure.  If the doctor  prescribes medicine remember to take it the way the doctor ordered.  Call the office if you cannot afford the medicine or if there are questions about it.     Notes:         This is a list of the screening recommended for you and due dates:  Health Maintenance  Topic Date Due   Eye exam for diabetics  04/25/2022   Medicare Annual Wellness Visit  11/21/2022   Colon Cancer Screening  12/20/2022*   Complete foot exam   01/17/2023*   Zoster (Shingles) Vaccine (1 of 2) 07/19/2023*   Hemoglobin A1C  05/18/2023   Yearly kidney function blood test for diabetes  10/28/2023   Yearly kidney health urinalysis for diabetes  10/28/2023   DTaP/Tdap/Td vaccine (2 - Td or Tdap) 02/23/2028   Pneumonia Vaccine  Completed   Flu Shot  Completed  Hepatitis C Screening: USPSTF Recommendation to screen - Ages 1-79 yo.  Completed   HPV Vaccine  Aged Out   COVID-19 Vaccine  Discontinued  *Topic was postponed. The date shown is not the original due date.    Advanced directives: copies in chart   Conditions/risks identified: lose some weight and continue exercise   Next appointment: Follow up in one year for your annual wellness visit.   Preventive Care 70 Years and Older, Male  Preventive care refers to lifestyle choices and visits with your health care provider that can promote health and wellness. What does preventive care include? A yearly physical exam. This is also called an annual well check. Dental exams once or twice a year. Routine eye exams. Ask your health care provider how often you should have your eyes checked. Personal lifestyle choices, including: Daily care of your teeth and gums. Regular physical activity. Eating a healthy diet. Avoiding tobacco and drug use. Limiting alcohol use. Practicing safe sex. Taking low doses of aspirin every day. Taking vitamin and mineral supplements as recommended by your health care provider. What happens during an annual well check? The  services and screenings done by your health care provider during your annual well check will depend on your age, overall health, lifestyle risk factors, and family history of disease. Counseling  Your health care provider may ask you questions about your: Alcohol use. Tobacco use. Drug use. Emotional well-being. Home and relationship well-being. Sexual activity. Eating habits. History of falls. Memory and ability to understand (cognition). Work and work Statistician. Screening  You may have the following tests or measurements: Height, weight, and BMI. Blood pressure. Lipid and cholesterol levels. These may be checked every 5 years, or more frequently if you are over 73 years old. Skin check. Lung cancer screening. You may have this screening every year starting at age 37 if you have a 30-pack-year history of smoking and currently smoke or have quit within the past 15 years. Fecal occult blood test (FOBT) of the stool. You may have this test every year starting at age 78. Flexible sigmoidoscopy or colonoscopy. You may have a sigmoidoscopy every 5 years or a colonoscopy every 10 years starting at age 54. Prostate cancer screening. Recommendations will vary depending on your family history and other risks. Hepatitis C blood test. Hepatitis B blood test. Sexually transmitted disease (STD) testing. Diabetes screening. This is done by checking your blood sugar (glucose) after you have not eaten for a while (fasting). You may have this done every 1-3 years. Abdominal aortic aneurysm (AAA) screening. You may need this if you are a current or former smoker. Osteoporosis. You may be screened starting at age 73 if you are at high risk. Talk with your health care provider about your test results, treatment options, and if necessary, the need for more tests. Vaccines  Your health care provider may recommend certain vaccines, such as: Influenza vaccine. This is recommended every year. Tetanus,  diphtheria, and acellular pertussis (Tdap, Td) vaccine. You may need a Td booster every 10 years. Zoster vaccine. You may need this after age 64. Pneumococcal 13-valent conjugate (PCV13) vaccine. One dose is recommended after age 74. Pneumococcal polysaccharide (PPSV23) vaccine. One dose is recommended after age 75. Talk to your health care provider about which screenings and vaccines you need and how often you need them. This information is not intended to replace advice given to you by your health care provider. Make sure you discuss any questions you have with  your health care provider. Document Released: 10/15/2015 Document Revised: 06/07/2016 Document Reviewed: 07/20/2015 Elsevier Interactive Patient Education  2017 [place] Prevention in the Home Falls can cause injuries. They can happen to people of all ages. There are many things you can do to make your home safe and to help prevent falls. What can I do on the outside of my home? Regularly fix the edges of walkways and driveways and fix any cracks. Remove anything that might make you trip as you walk through a door, such as a raised step or threshold. Trim any bushes or trees on the path to your home. Use bright outdoor lighting. Clear any walking paths of anything that might make someone trip, such as rocks or tools. Regularly check to see if handrails are loose or broken. Make sure that both sides of any steps have handrails. Any raised decks and porches should have guardrails on the edges. Have any leaves, snow, or ice cleared regularly. Use sand or salt on walking paths during winter. Clean up any spills in your garage right away. This includes oil or grease spills. What can I do in the bathroom? Use night lights. Install grab bars by the toilet and in the tub and shower. Do not use towel bars as grab bars. Use non-skid mats or decals in the tub or shower. If you need to sit down in the shower, use a plastic,  non-slip stool. Keep the floor dry. Clean up any water that spills on the floor as soon as it happens. Remove soap buildup in the tub or shower regularly. Attach bath mats securely with double-sided non-slip rug tape. Do not have throw rugs and other things on the floor that can make you trip. What can I do in the bedroom? Use night lights. Make sure that you have a light by your bed that is easy to reach. Do not use any sheets or blankets that are too big for your bed. They should not hang down onto the floor. Have a firm chair that has side arms. You can use this for support while you get dressed. Do not have throw rugs and other things on the floor that can make you trip. What can I do in the kitchen? Clean up any spills right away. Avoid walking on wet floors. Keep items that you use a lot in easy-to-reach places. If you need to reach something above you, use a strong step stool that has a grab bar. Keep electrical cords out of the way. Do not use floor polish or wax that makes floors slippery. If you must use wax, use non-skid floor wax. Do not have throw rugs and other things on the floor that can make you trip. What can I do with my stairs? Do not leave any items on the stairs. Make sure that there are handrails on both sides of the stairs and use them. Fix handrails that are broken or loose. Make sure that handrails are as long as the stairways. Check any carpeting to make sure that it is firmly attached to the stairs. Fix any carpet that is loose or worn. Avoid having throw rugs at the top or bottom of the stairs. If you do have throw rugs, attach them to the floor with carpet tape. Make sure that you have a light switch at the top of the stairs and the bottom of the stairs. If you do not have them, ask someone to add them for you. What else can  I do to help prevent falls? Wear shoes that: Do not have high heels. Have rubber bottoms. Are comfortable and fit you well. Are closed  at the toe. Do not wear sandals. If you use a stepladder: Make sure that it is fully opened. Do not climb a closed stepladder. Make sure that both sides of the stepladder are locked into place. Ask someone to hold it for you, if possible. Clearly mark and make sure that you can see: Any grab bars or handrails. First and last steps. Where the edge of each step is. Use tools that help you move around (mobility aids) if they are needed. These include: Canes. Walkers. Scooters. Crutches. Turn on the lights when you go into a dark area. Replace any light bulbs as soon as they burn out. Set up your furniture so you have a clear path. Avoid moving your furniture around. If any of your floors are uneven, fix them. If there are any pets around you, be aware of where they are. Review your medicines with your doctor. Some medicines can make you feel dizzy. This can increase your chance of falling. Ask your doctor what other things that you can do to help prevent falls. This information is not intended to replace advice given to you by your health care provider. Make sure you discuss any questions you have with your health care provider. Document Released: 07/15/2009 Document Revised: 02/24/2016 Document Reviewed: 10/23/2014 Elsevier Interactive Patient Education  2017 [employer].

## 2023-01-05 ENCOUNTER — Other Ambulatory Visit: Payer: Self-pay | Admitting: Cardiology

## 2023-01-17 ENCOUNTER — Ambulatory Visit: Payer: PPO | Admitting: Physician Assistant

## 2023-02-06 ENCOUNTER — Telehealth: Payer: Self-pay | Admitting: Pharmacist

## 2023-02-06 NOTE — Progress Notes (Signed)
Care Management & Coordination Services Pharmacy Team  Reason for Encounter: Hypertension  Contacted patient to discuss hypertension disease state. Spoke with patient on 02/06/2023     Current antihypertensive regimen:  Losartan 25 mg daily Metoprolol 25 mg daily  Patient verbally confirms he is taking the above medications as directed. Yes  How often are you checking your Blood Pressure? 1-2x per week  Current home BP readings: 130-35/65-70  Any readings above 180/100? No If yes any symptoms of hypertensive emergency? patient denies any symptoms of high blood pressure  What recent interventions/DTPs have been made by any provider to improve Blood Pressure control since last CPP Visit: No recent interventions or DTPs.  Any recent hospitalizations or ED visits since last visit with CPP? No  What diet changes have been made to improve Blood Pressure Control?  No recent diet changes.  What exercise is being done to improve your Blood Pressure Control?  Patient states he is still doing water aerobics.   Adherence Review: Is the patient currently on ACE/ARB medication? Yes Does the patient have >5 day gap between last estimated fill dates? No  Star Rating Drugs:  Losartan 25 mg last filled 01/25/2023 90 DS Metformin 500 mg last filled 10/25/2022 90 DS   Chart Updates: Recent office visits:  None  Recent consult visits:  None  Hospital visits:  None in previous 6 months  Medications: Outpatient Encounter Medications as of 02/06/2023  Medication Sig   ALFUZOSIN HCL ER PO Take 10 mg by mouth daily in the afternoon. Prescribed by Dr. Annabell Howells   allopurinol (ZYLOPRIM) 300 MG tablet TAKE 1 TABLET BY MOUTH DAILY WITH LUNCH.   ASPIRIN 81 81 MG chewable tablet Chew 81 mg by mouth daily.   Blood Glucose Monitoring Suppl (ONE TOUCH ULTRA 2) w/Device KIT USE TO CHECK BLOOD SUGAR   Calcium Carb-Cholecalciferol (CALCIUM 600+D3 PO) Take by mouth.   Cyanocobalamin (B-12) 2500 MCG TABS  Take 1 tablet by mouth.   Evolocumab (REPATHA SURECLICK) 140 MG/ML SOAJ INJECT EVERY 2 WEEKS   glucose blood (ONETOUCH ULTRA) test strip USE TO CHECK BLOOD SUGAR DAILY AND PRN   losartan (COZAAR) 25 MG tablet TAKE 1 TABLET (25 MG TOTAL) BY MOUTH DAILY.   metFORMIN (GLUCOPHAGE) 500 MG tablet Take 500 mg by mouth daily with breakfast.   metoprolol succinate (TOPROL-XL) 25 MG 24 hr tablet TAKE 1 TABLET BY MOUTH EVERY DAY   nitroGLYCERIN (NITROSTAT) 0.4 MG SL tablet Place 1 tablet (0.4 mg total) under the tongue every 5 (five) minutes x 3 doses as needed for chest pain.   ranolazine (RANEXA) 1000 MG SR tablet TAKE 1 TABLET BY MOUTH TWICE A DAY   No facility-administered encounter medications on file as of 02/06/2023.    Recent Office Vitals: BP Readings from Last 3 Encounters:  12/07/22 (!) 118/58  11/17/22 130/72  11/06/22 109/64   Pulse Readings from Last 3 Encounters:  12/07/22 72  11/17/22 (!) 58  11/06/22 (!) 58    Wt Readings from Last 3 Encounters:  12/07/22 211 lb (95.7 kg)  11/17/22 208 lb 8 oz (94.6 kg)  11/06/22 207 lb (93.9 kg)     Kidney Function Lab Results  Component Value Date/Time   CREATININE 1.7 (A) 10/27/2022 12:00 AM   CREATININE 1.49 (H) 08/11/2022 10:04 AM   CREATININE 1.41 07/18/2022 09:43 AM   CREATININE 1.41 (H) 09/07/2020 01:42 PM   CREATININE 1.65 (H) 06/08/2020 09:41 AM   GFR 48.78 (L) 07/18/2022 09:43 AM  GFRNONAA 58 (L) 10/21/2019 11:56 AM   GFRNONAA 49 08/21/2018 12:00 AM   GFRAA >60 10/21/2019 11:56 AM       Latest Ref Rng & Units 10/27/2022   12:00 AM 08/11/2022   10:04 AM 07/18/2022    9:43 AM  BMP  Glucose 70 - 99 mg/dL  119  147   BUN 4 - 21 24     28  22    Creatinine 0.6 - 1.3 1.7     1.49  1.41   BUN/Creat Ratio 10 - 24  19    Sodium 137 - 147 135     139  140   Potassium 3.5 - 5.1 mEq/L 4.7     4.6  5.0   Chloride 99 - 108 98     102  103   CO2 13 - 22 22     23  29    Calcium 8.7 - 10.7 9.3     9.6  9.8      This result  is from an external source.     Future Appointments  Date Time Provider Department Center  05/07/2023  9:30 AM Elder Negus, [place] [facility] None  05/23/2023  9:00 AM Jarold Motto, [place] [facility]  11/29/2023  2:00 PM Erroll Luna, [place] [facility] None  12/17/2023  1:30 PM [facility] [place] Malling VISIT 1 [facility]   April D Calhoun, [facility] Clinical Pharmacist Assistant [phone]

## 2023-02-14 DIAGNOSIS — L219 Seborrheic dermatitis, unspecified: Secondary | ICD-10-CM | POA: Diagnosis not present

## 2023-02-14 DIAGNOSIS — L820 Inflamed seborrheic keratosis: Secondary | ICD-10-CM | POA: Diagnosis not present

## 2023-03-08 ENCOUNTER — Other Ambulatory Visit: Payer: Self-pay | Admitting: Cardiology

## 2023-03-08 DIAGNOSIS — I2510 Atherosclerotic heart disease of native coronary artery without angina pectoris: Secondary | ICD-10-CM

## 2023-03-27 ENCOUNTER — Other Ambulatory Visit: Payer: Self-pay | Admitting: Cardiology

## 2023-03-31 ENCOUNTER — Other Ambulatory Visit: Payer: Self-pay | Admitting: Internal Medicine

## 2023-04-23 ENCOUNTER — Other Ambulatory Visit: Payer: Self-pay | Admitting: Cardiology

## 2023-04-23 DIAGNOSIS — E119 Type 2 diabetes mellitus without complications: Secondary | ICD-10-CM

## 2023-05-02 ENCOUNTER — Encounter (INDEPENDENT_AMBULATORY_CARE_PROVIDER_SITE_OTHER): Payer: Self-pay

## 2023-05-05 ENCOUNTER — Other Ambulatory Visit: Payer: Self-pay | Admitting: Physician Assistant

## 2023-05-07 ENCOUNTER — Ambulatory Visit: Payer: PPO | Admitting: Cardiology

## 2023-05-14 DIAGNOSIS — Z8546 Personal history of malignant neoplasm of prostate: Secondary | ICD-10-CM | POA: Diagnosis not present

## 2023-05-14 LAB — PSA: PSA: 0.15

## 2023-05-16 NOTE — Progress Notes (Signed)
Subjective:    Nicholas Maxwell is a 77 y.o. male and is here for a comprehensive physical exam.  HPI  Health Maintenance Due  Topic Date Due   HEMOGLOBIN A1C  05/18/2023    Acute Concerns: None  Chronic Issues: Hypertension Treated with metoprolol succinate 25 mg daily, losartan 25 mg daily. S/p multivessel PCI 2018.  Taking Ranexa, aspirin. Blood pressure normal today at 110/62. Followed by cardiologist, Dr. Truett Mainland.  Type 2 Diabetes Mellitus Treated with metformin 500 mg with breakfast. He has lost 7 pounds between 11/17/22 and today.  Hyperlipidemia Treated with Repatha 140 mg every fourteen days.  Gout Treated with allopurinol 300 mg with lunch. Continues to see urology.  Labs are pending. He sees a dermatologist annually- no new or concerning skin growths. Denies unusual headaches, swelling in ankles, GI symptoms, joint pain, memory issues.  Health Maintenance: Immunizations -- UTD on tetanus vaccine. Colonoscopy -- Last completed 12/07/16. No repeat recommended. Aged out PSA -- followed closely by urology Lab Results  Component Value Date   PSA 0.039 06/07/2021   PSA 0.092 03/01/2021   PSA 1.04 09/01/2020   Diet -- Healthy Sleep habits -- Stable Exercise -- Goes to the [facility] for exercise.  Weight --  Recent weight history Wt Readings from Last 10 Encounters:  05/23/23 201 lb 4 oz (91.3 kg)  12/07/22 211 lb (95.7 kg)  11/17/22 208 lb 8 oz (94.6 kg)  11/06/22 207 lb (93.9 kg)  08/16/22 204 lb 8 oz (92.8 kg)  07/31/22 209 lb (94.8 kg)  07/18/22 207 lb 8 oz (94.1 kg)  12/19/21 204 lb 3.2 oz (92.6 kg)  11/21/21 205 lb 6.4 oz (93.2 kg)  06/24/21 202 lb (91.6 kg)   Body mass index is 29.72 kg/m.  Mood -- Stable Alcohol use --  reports that he does not currently use alcohol.  Tobacco use --  Tobacco Use: Medium Risk (05/23/2023)   Patient History    Smoking Tobacco Use: Former    Smokeless Tobacco Use: Never    Passive Exposure: Not  on file    Eligible for Low Dose CT? No  UTD with eye doctor? Yes UTD with dentist? Yes     05/23/2023    9:16 AM  Depression screen PHQ 2/9  Decreased Interest 0  Down, Depressed, Hopeless 0  PHQ - 2 Score 0    Other providers/specialists: Patient Care Team: Jarold Motto, [place] as PCP - General (Physician Assistant) Bjorn Pippin, MD as Attending Physician (Urology) Rutherford Guys., MD as Referring Physician (Gastroenterology) Elder Negus, MD as Consulting Physician (Cardiology) Alric Quan, [facility] (Gastroenterology) Durene Romans, MD as Consulting Physician (Orthopedic Surgery) Erroll Luna, [facility] (Inactive) (Pharmacist) [facility] as Consulting Physician (Optometry)    PMHx, SurgHx, SocialHx, Medications, and Allergies were reviewed in the Visit Navigator and updated as appropriate.   Past Medical History:  Diagnosis Date   Allergy 12/1998   Seasonal spring and fall. Crestor and statins.   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   Essential hypertension 05/12/2015   History of kidney stones    Myocardial infarction [facility] 04/2017   Prostate cancer (HCC) 05/2020   Statin intolerance    Used >2 different statins   Type 2 diabetes mellitus without complication, without long-term current use of insulin (HCC) 05/12/2015     Past Surgical History:  Procedure Laterality Date   COLONOSCOPY     March of 2018  CORONARY ANGIOPLASTY WITH STENT PLACEMENT  05/24/2017   CORONARY PRESSURE/FFR STUDY N/A 05/24/2017   Procedure: INTRAVASCULAR PRESSURE WIRE/FFR STUDY;  Surgeon: Elder Negus, MD;  Location: [facility] INVASIVE CV LAB;  Service: Cardiovascular;  Laterality: N/A;  MID LAD   CORONARY STENT INTERVENTION N/A 05/24/2017   Procedure: CORONARY STENT INTERVENTION;  Surgeon: Elder Negus, MD;  Location: [facility] INVASIVE CV LAB;  Service: Cardiovascular;  Laterality: N/A;  MID Circumflex 4.0x22 Onyx MID LAD 2.75x15 Onyx    CYSTOSCOPY WITH URETEROSCOPY, STONE BASKETRY AND STENT PLACEMENT  ~ 1990   HERNIA REPAIR     JOINT REPLACEMENT  10/2019   Right knee   KNEE ARTHROSCOPY Right 2016   LEFT HEART CATH AND CORONARY ANGIOGRAPHY N/A 05/24/2017   Procedure: LEFT HEART CATH AND CORONARY ANGIOGRAPHY;  Surgeon: Elder Negus, MD;  Location: [facility]VAS[facility] CV [facility];  Service: Cardiovascular;  Laterality: N/A;   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   Procedure: TOTAL KNEE ARTHROPLASTY;  Surgeon: Durene Romans, MD;  Location: [facility] ORS;  Service: Orthopedics;  Laterality: Right;  70 mins   UMBILICAL HERNIA REPAIR  1992   UPPER GASTROINTESTINAL ENDOSCOPY       Family History  Problem Relation Age of Onset   Transient ischemic attack Father 55   Bone cancer Father    Cancer Father    Stroke Father    CAD Mother 39       CABG   Diabetes Mother    COPD Mother    Breast cancer Neg Hx    Colon cancer Neg Hx    Pancreatic cancer Neg Hx    Prostate cancer Neg Hx     Social History   Tobacco Use   Smoking status: Former    Current packs/day: 0.00    Average packs/day: 1 pack/day for 30.0 years (30.0 ttl pk-yrs)    Types: Cigarettes    Start date: 10/03/1971    Quit date: 10/02/2001    Years since quitting: 21.6   Smokeless tobacco: Never  Vaping Use   Vaping status: Never Used  Substance Use Topics   Alcohol use: Not Currently    Comment: has not drank since 2014   Drug use: No    Review of Systems:   Review of Systems  Constitutional:  Negative for chills, fever, malaise/fatigue and weight loss.  HENT:  Negative for hearing loss, sinus pain and sore throat.   Respiratory:  Negative for cough, hemoptysis and shortness of breath.   Cardiovascular:  Negative for chest pain, palpitations, leg swelling and PND.  Gastrointestinal:  Negative for abdominal pain, constipation, diarrhea, heartburn, nausea and vomiting.  Genitourinary:  Negative for dysuria, frequency and urgency.  Musculoskeletal:   Negative for back pain, myalgias and neck pain.  Skin:  Negative for itching and rash.  Neurological:  Negative for dizziness, tingling, seizures and headaches.  Endo/Heme/Allergies:  Negative for polydipsia.  Psychiatric/Behavioral:  Negative for depression. The patient is not nervous/anxious.     Objective:    Vitals:   05/23/23 0918  BP: 110/62  Pulse: 84  Temp: (!) 97.1 F (36.2 C)  SpO2: 99%    Body mass index is 29.72 kg/m.  General  Alert, cooperative, no distress, appears stated age  Head:  Normocephalic, without obvious abnormality, atraumatic  Eyes:  PERRL, conjunctiva/corneas clear, EOM's intact, fundi benign, both eyes       Ears:  Normal TM's and external ear canals, both ears  Nose: Nares normal, septum midline, mucosa normal,  no drainage or sinus tenderness  Throat: Lips, mucosa, and tongue normal; teeth and gums normal  Neck: Supple, symmetrical, trachea midline, no adenopathy;     thyroid:  No enlargement/tenderness/nodules; no carotid bruit or JVD  Back:   Symmetric, no curvature, ROM normal, no CVA tenderness  Lungs:   Clear to auscultation bilaterally, respirations unlabored  Chest wall:  No tenderness or deformity  Heart:  Regular rate and rhythm, S1 and S2 normal, no murmur, rub or gallop  Abdomen:   Soft, non-tender, bowel sounds active all four quadrants, no masses, no organomegaly  Extremities: Extremities normal, atraumatic, no cyanosis or edema  Prostate : Deferred   Skin: Skin color, texture, turgor normal, no rashes or lesions  Lymph nodes: Cervical, supraclavicular, and axillary nodes normal  Neurologic: CNII-XII grossly intact. Normal strength, sensation and reflexes throughout   AssessmentPlan:   Routine physical examination Today patient counseled on age appropriate routine health concerns for screening and prevention, each reviewed and up to date or declined. Immunizations reviewed and up to date or declined. Labs ordered and reviewed.  Risk factors for depression reviewed and negative. Hearing function and visual acuity are intact. ADLs screened and addressed as needed. Functional ability and level of safety reviewed and appropriate. Education, counseling and referrals performed based on assessed risks today. Patient provided with a copy of personalized plan for preventive services.  Type 2 diabetes mellitus without complication, without long-term current use of insulin (HCC) Update A1c and provide recommendations accordingly to metformin 500 mg daily  Chronic kidney disease, stage 3b (HCC) Update blood work and provide recommendations accordingly  Gouty arthropathy Update uric acid level and adjust allopurinol  Coronary artery disease involving native coronary artery of native heart with angina pectoris (HCC) Stable Has follow-up with cardiology tomorrow Continue healthy lifestyle efforts, heart healthy diet as tolerated and continued medication regimen  Overweight Continue healthy lifestyle   I,Alexander Ruley,acting as a scribe for [employer], PA.,have documented all relevant documentation on the behalf of Jarold Motto, PA,as directed by  Jarold Motto, PA while in the presence of Jarold Motto, [place].  I, Jarold Motto, [place], have reviewed all documentation for this visit. The documentation on 05/23/23 for the exam, diagnosis, procedures, and orders are all accurate and complete.    Jarold Motto, PA-C [facility]

## 2023-05-21 DIAGNOSIS — N403 Nodular prostate with lower urinary tract symptoms: Secondary | ICD-10-CM | POA: Diagnosis not present

## 2023-05-21 DIAGNOSIS — R351 Nocturia: Secondary | ICD-10-CM | POA: Diagnosis not present

## 2023-05-21 DIAGNOSIS — E349 Endocrine disorder, unspecified: Secondary | ICD-10-CM | POA: Diagnosis not present

## 2023-05-21 DIAGNOSIS — C61 Malignant neoplasm of prostate: Secondary | ICD-10-CM | POA: Diagnosis not present

## 2023-05-23 ENCOUNTER — Encounter: Payer: Self-pay | Admitting: Physician Assistant

## 2023-05-23 ENCOUNTER — Ambulatory Visit: Payer: PPO | Admitting: Physician Assistant

## 2023-05-23 VITALS — BP 110/62 | HR 84 | Temp 97.1°F | Ht 69.0 in | Wt 201.2 lb

## 2023-05-23 DIAGNOSIS — Z13220 Encounter for screening for lipoid disorders: Secondary | ICD-10-CM | POA: Diagnosis not present

## 2023-05-23 DIAGNOSIS — Z7984 Long term (current) use of oral hypoglycemic drugs: Secondary | ICD-10-CM

## 2023-05-23 DIAGNOSIS — M109 Gout, unspecified: Secondary | ICD-10-CM

## 2023-05-23 DIAGNOSIS — E663 Overweight: Secondary | ICD-10-CM

## 2023-05-23 DIAGNOSIS — E119 Type 2 diabetes mellitus without complications: Secondary | ICD-10-CM | POA: Diagnosis not present

## 2023-05-23 DIAGNOSIS — Z0000 Encounter for general adult medical examination without abnormal findings: Secondary | ICD-10-CM

## 2023-05-23 DIAGNOSIS — I25119 Atherosclerotic heart disease of native coronary artery with unspecified angina pectoris: Secondary | ICD-10-CM | POA: Diagnosis not present

## 2023-05-23 DIAGNOSIS — N1832 Chronic kidney disease, stage 3b: Secondary | ICD-10-CM

## 2023-05-23 LAB — CBC WITH DIFFERENTIAL/PLATELET
Basophils Absolute: 0 10*3/uL (ref 0.0–0.1)
Basophils Relative: 0.5 % (ref 0.0–3.0)
Eosinophils Absolute: 0.1 10*3/uL (ref 0.0–0.7)
Eosinophils Relative: 1.2 % (ref 0.0–5.0)
HCT: 36.3 % — ABNORMAL LOW (ref 39.0–52.0)
Hemoglobin: 12.1 g/dL — ABNORMAL LOW (ref 13.0–17.0)
Lymphocytes Relative: 24.3 % (ref 12.0–46.0)
Lymphs Abs: 1.2 10*3/uL (ref 0.7–4.0)
MCHC: 33.4 g/dL (ref 30.0–36.0)
MCV: 102.9 fl — ABNORMAL HIGH (ref 78.0–100.0)
Monocytes Absolute: 0.3 10*3/uL (ref 0.1–1.0)
Monocytes Relative: 6.5 % (ref 3.0–12.0)
Neutro Abs: 3.4 10*3/uL (ref 1.4–7.7)
Neutrophils Relative %: 67.5 % (ref 43.0–77.0)
Platelets: 231 10*3/uL (ref 150.0–400.0)
RBC: 3.52 Mil/uL — ABNORMAL LOW (ref 4.22–5.81)
RDW: 13.6 % (ref 11.5–15.5)
WBC: 5.1 10*3/uL (ref 4.0–10.5)

## 2023-05-23 LAB — LDL CHOLESTEROL, DIRECT: Direct LDL: 55 mg/dL

## 2023-05-23 LAB — LIPID PANEL
Cholesterol: 116 mg/dL (ref 0–200)
HDL: 42.3 mg/dL (ref >39.00)
NonHDL: 74.14
Total CHOL/HDL Ratio: 3
Triglycerides: 215 mg/dL — ABNORMAL HIGH (ref 0.0–149.0)
VLDL: 43 mg/dL — ABNORMAL HIGH (ref 0.0–40.0)

## 2023-05-23 LAB — MICROALBUMIN / CREATININE URINE RATIO
Creatinine,U: 121.7 mg/dL
Microalb Creat Ratio: 0.6 mg/g (ref 0.0–30.0)
Microalb, Ur: <0.7 mg/dL (ref 0.0–1.9)

## 2023-05-23 LAB — COMPREHENSIVE METABOLIC PANEL
ALT: 18 U/L (ref 0–53)
AST: 20 U/L (ref 0–37)
Albumin: 4.3 g/dL (ref 3.5–5.2)
Alkaline Phosphatase: 55 U/L (ref 39–117)
BUN: 25 mg/dL — ABNORMAL HIGH (ref 6–23)
CO2: 28 mEq/L (ref 19–32)
Calcium: 9.9 mg/dL (ref 8.4–10.5)
Chloride: 102 mEq/L (ref 96–112)
Creatinine, Ser: 1.53 mg/dL — ABNORMAL HIGH (ref 0.40–1.50)
GFR: 43.97 mL/min — ABNORMAL LOW (ref >60.00)
Glucose, Bld: 134 mg/dL — ABNORMAL HIGH (ref 70–99)
Potassium: 5 mEq/L (ref 3.5–5.1)
Sodium: 137 mEq/L (ref 135–145)
Total Bilirubin: 0.6 mg/dL (ref 0.2–1.2)
Total Protein: 7.6 g/dL (ref 6.0–8.3)

## 2023-05-23 LAB — URIC ACID: Uric Acid, Serum: 4.3 mg/dL (ref 4.0–7.8)

## 2023-05-23 LAB — HEMOGLOBIN A1C: Hgb A1c MFr Bld: 6.3 % (ref 4.6–6.5)

## 2023-05-23 NOTE — Patient Instructions (Signed)
It was great to see you! ? ?Please go to the lab for blood work.  ? ?Our office will call you with your results unless you have chosen to receive results via MyChart. ? ?If your blood work is normal we will follow-up each year for physicals and as scheduled for chronic medical problems. ? ?If anything is abnormal we will treat accordingly and get you in for a follow-up. ? ?Take care, ? ?Samantha ?  ? ? ?

## 2023-05-24 ENCOUNTER — Other Ambulatory Visit: Payer: Self-pay | Admitting: Physician Assistant

## 2023-05-24 ENCOUNTER — Ambulatory Visit: Payer: PPO | Admitting: Cardiology

## 2023-05-24 ENCOUNTER — Encounter: Payer: Self-pay | Admitting: Cardiology

## 2023-05-24 VITALS — BP 119/75 | HR 55 | Resp 16 | Ht 69.0 in | Wt 203.0 lb

## 2023-05-24 DIAGNOSIS — I10 Essential (primary) hypertension: Secondary | ICD-10-CM | POA: Diagnosis not present

## 2023-05-24 DIAGNOSIS — E782 Mixed hyperlipidemia: Secondary | ICD-10-CM

## 2023-05-24 DIAGNOSIS — I25118 Atherosclerotic heart disease of native coronary artery with other forms of angina pectoris: Secondary | ICD-10-CM

## 2023-05-24 DIAGNOSIS — I77819 Aortic ectasia, unspecified site: Secondary | ICD-10-CM

## 2023-05-24 DIAGNOSIS — D539 Nutritional anemia, unspecified: Secondary | ICD-10-CM

## 2023-05-24 MED ORDER — REPATHA SURECLICK 140 MG/ML ~~LOC~~ SOAJ: 140.0000 mg | SUBCUTANEOUS | 3 refills | Status: DC

## 2023-05-24 NOTE — Progress Notes (Signed)
Patient is here for follow up visit.  Subjective:   Nicholas Maxwell, male    DOB: 08/09/1946, 77 y.o.   MRN: [id number]  No chief complaint on file.  77 year old pleasant Caucasian male with hypertension, hyperlipidemia, CAD s/p LAD and Lcx PCI 2018, hypertension, type 2 DM, CKD 3, h/o prostate cancer  Patient is doing well.  He denies any chest pain.  He stays active with his water aerobic activity.  Exertional dyspnea is mild and unchanged.  Reviewed recent lab results with the patient, details below.   Current Outpatient Medications:    ALFUZOSIN HCL ER PO, Take 10 mg by mouth daily in the afternoon. Prescribed by Dr. Annabell Howells, Disp: , Rfl:    allopurinol (ZYLOPRIM) 300 MG tablet, TAKE 1 TABLET BY MOUTH DAILY WITH LUNCH., Disp: 90 tablet, Rfl: 1   ASPIRIN 81 81 MG chewable tablet, Chew 81 mg by mouth daily., Disp: , Rfl:    Blood Glucose Monitoring Suppl (ONE TOUCH ULTRA 2) w/Device KIT, USE TO CHECK BLOOD SUGAR, Disp: 1 kit, Rfl: 0   Calcium Carb-Cholecalciferol (CALCIUM 600+D3 PO), Take by mouth., Disp: , Rfl:    Cyanocobalamin (B-12) 2500 MCG TABS, Take 1 tablet by mouth., Disp: , Rfl:    Evolocumab (REPATHA SURECLICK) 140 MG/ML SOAJ, INJECT EVERY 2 WEEKS, Disp: 2 mL, Rfl: 3   glucose blood (ONETOUCH ULTRA) test strip, USE TO CHECK BLOOD SUGAR DAILY AND PRN, Disp: 100 each, Rfl: 4   losartan (COZAAR) 25 MG tablet, TAKE 1 TABLET (25 MG TOTAL) BY MOUTH DAILY., Disp: 90 tablet, Rfl: 1   metFORMIN (GLUCOPHAGE) 500 MG tablet, Take 500 mg by mouth daily with breakfast., Disp: , Rfl:    metoprolol succinate (TOPROL-XL) 25 MG 24 hr tablet, TAKE 1 TABLET BY MOUTH EVERY DAY, Disp: 90 tablet, Rfl: 2   nitroGLYCERIN (NITROSTAT) 0.4 MG SL tablet, Place 1 tablet (0.4 mg total) under the tongue every 5 (five) minutes x 3 doses as needed for chest pain., Disp: 30 tablet, Rfl: 2   ranolazine (RANEXA) 1000 MG SR tablet, TAKE 1 TABLET BY MOUTH TWICE A DAY, Disp: 180 tablet, Rfl:  1  Cardiovascular studies:  EKG 05/24/2023: Sinus rhythm 54 bpm Diffuse nonspecific T-abnormality  Echocardiogram 08/15/2022: Left ventricle cavity is normal in size. Moderate concentric hypertrophy of the left ventricle. Normal global wall motion. Normal LV systolic function with EF 60%. Doppler evidence of grade I (impaired) diastolic dysfunction, normal LAP. 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 Normal right atrial pressure. Unlike previous study in 2018, wall motion abnormalities, mod MR not appreciated on this study.  Exercise nuclear stress test 08/15/2022: Myocardial perfusion is normal. Overall LV systolic function is normal without regional wall motion abnormalities. Stress LV EF: 55%. Low risk study. Normal ECG stress. The patient exercised for 6 minutes and 3 seconds of a Bruce protocol, achieving approximately 7.1 METs and 88% MPHR. The heart rate response was normal. The blood pressure response was normal. No previous exam available for comparison.   EKG 07/31/2022: Sinus rhythm 54 bpm Nonspecific T-abnormality  Abdominal aortic duplex 09/11/2018: The maximum aorta diameter is 2.53 cm (mid). Diffuse heterogeneous plaque observed in the proximal, mid and distal aorta.  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  Normal flow velocities noted in aorta and iliac arteries. Rec: Rescan in 5 years if clinically indicated.  Cath 05/24/2017: LM: Normal LAD: 60% mid  LAD calcific stenosis FFR +ve 0.77. Mild diffuse disease in small caliber distal LAD  Successful complex PTCA and DES placement Onyx 2.75 X 15 mm Mid LAD     (60%-->0%, TIMI III - TIMI III) Ramus intermedius: Small caliber vessel with 50-70% stenoses LCx: Large dominant vessel with mid circumflex subtotal occlusion with retrograde filling of distal circumflex and PDA Successful PTCA and DES placement Onyx 4.0 X 22 mm Mid left circumflex      (99%-->0%, TIMI I - TIMI III) RCA: Small caliber nondominant vessel with mild diffuse disease  Exercise myoview stress 05/18/2017: 1. The patient performed treadmill exercise using a Bruce protocol, completing 8 minutes. The patient completed an estimated workload of 10.12 METS achieving 85% maximum predicted heart rate. Stress symptoms included 4/10 chest pain and dyspnea.  2. The stress electrocardiogram revealed 1-1.5 mm of horizontal ST depression in lead (s):II, III, aVF, V4, V5, V6 consistent with myocardial ischemia.   3. This is an abnormal myocardial perfusion imaging study demonstrating a mixture of scar plus ischemia in the basal inferoseptal, basal inferior, basal inferolateral, mid inferoseptal, mid inferior, mid inferolateral and apical inferior myocardial wall(s) (LCx/RCA territory) 4. Left ventricular systolic function was abnormal with regional wall motion abnormalities.  The left ventricular ejection fraction was calculated to be 39%.   5. This is a high risk study  Echocardiogram 05/15/2017: Left ventricle cavity is normal in size. Left ventricle regional wall motion findings: Basal anterolateral, Basal inferolateral, Mid anterolateral and Mid inferolateral hypokinesis. Calculated EF 55%. Structurally normal mitral valve with moderate (Grade II) regurgitation.  Recent labs: 05/23/2023: Glucose 134, BUN/Cr 25/1.53. EGFR 43. Na/K 137/5.0. Rest of the CMP normal H/H 12/36. MCV 102. Platelets 231 HbA1C 6.3% Chol 116, TG 215, HDL 42, LDL 55  08/11/2022: Chol 103, TG 130, HDL 49, LDL 31  07/18/2022: Glucose 202, BUN/Cr 22/1.41. EGFR 48. Na/K 140/5.0. Rest of the CMP normal H/H 12/35. MCV 102. Platelets 190 HbA1C 6.5%  11/2021: Chol 138, TG 266, HDL 49, dLDL 43   Review of Systems  Cardiovascular:  Positive for dyspnea on exertion. Negative for chest pain, leg swelling, palpitations and syncope.       Objective:   Vitals:   05/24/23 1146  BP: 119/75  Pulse: (!)  55  Resp: 16  SpO2: 97%      Physical Exam Vitals and nursing note reviewed.  Constitutional:      General: He is not in acute distress. Neck:     Vascular: No JVD.  Cardiovascular:     Rate and Rhythm: Normal rate and regular rhythm.     Heart sounds: Normal heart sounds. No murmur heard. Pulmonary:     Effort: Pulmonary effort is normal.     Breath sounds: Normal breath sounds. No wheezing or rales.  Musculoskeletal:     Right lower leg: No edema.     Left lower leg: No edema.         Assessment & Recommendations:   77 year old pleasant Caucasian male with hypertension, hyperlipidemia, type 2 DM, CAD, CKD 3, h/o prostate cancer s/p radiation.  CAD: S/p multivessel PCI 2018. No ischemia on stress testing (08/2022). Exertional dyspnea mild and unchanged. Continue aspirin, Repatha, metoprolol, Ranexa, losartan 25 mg daily.  Mixed hyperlipidemia H/o statin myopathy. Well controlled on Repatha.   Essential hypertension Controlled  Type 2 diabetes mellitus without complication, without long-term current use of insulin (HCC) Continue metformin 500 mg bid.  Aortic ectasia: Noted on aortic BX in 2019. Will  repeat aorta duplex.   F/u in 1 year   Elder Negus, MD Pager: [phone] Office: [phone]

## 2023-05-27 ENCOUNTER — Encounter: Payer: Self-pay | Admitting: Physician Assistant

## 2023-05-30 ENCOUNTER — Other Ambulatory Visit (INDEPENDENT_AMBULATORY_CARE_PROVIDER_SITE_OTHER): Payer: PPO

## 2023-05-30 DIAGNOSIS — D539 Nutritional anemia, unspecified: Secondary | ICD-10-CM

## 2023-05-30 LAB — IBC + FERRITIN
Ferritin: 74.5 ng/mL (ref 22.0–322.0)
Iron: 71 ug/dL (ref 42–165)
Saturation Ratios: 21.1 % (ref 20.0–50.0)
TIBC: 336 ug/dL (ref 250.0–450.0)
Transferrin: 240 mg/dL (ref 212.0–360.0)

## 2023-05-30 LAB — CBC WITH DIFFERENTIAL/PLATELET
Basophils Absolute: 0 10*3/uL (ref 0.0–0.1)
Basophils Relative: 0.5 % (ref 0.0–3.0)
Eosinophils Absolute: 0.1 10*3/uL (ref 0.0–0.7)
Eosinophils Relative: 1.2 % (ref 0.0–5.0)
HCT: 34 % — ABNORMAL LOW (ref 39.0–52.0)
Hemoglobin: 11.2 g/dL — ABNORMAL LOW (ref 13.0–17.0)
Lymphocytes Relative: 23.3 % (ref 12.0–46.0)
Lymphs Abs: 1.2 10*3/uL (ref 0.7–4.0)
MCHC: 32.9 g/dL (ref 30.0–36.0)
MCV: 104.2 fl — ABNORMAL HIGH (ref 78.0–100.0)
Monocytes Absolute: 0.4 10*3/uL (ref 0.1–1.0)
Monocytes Relative: 7.4 % (ref 3.0–12.0)
Neutro Abs: 3.4 10*3/uL (ref 1.4–7.7)
Neutrophils Relative %: 67.6 % (ref 43.0–77.0)
Platelets: 209 10*3/uL (ref 150.0–400.0)
RBC: 3.26 Mil/uL — ABNORMAL LOW (ref 4.22–5.81)
RDW: 13.8 % (ref 11.5–15.5)
WBC: 5 10*3/uL (ref 4.0–10.5)

## 2023-05-30 LAB — VITAMIN B12: Vitamin B-12: 1176 pg/mL — ABNORMAL HIGH (ref 211–911)

## 2023-05-31 ENCOUNTER — Other Ambulatory Visit: Payer: PPO

## 2023-06-05 ENCOUNTER — Ambulatory Visit (INDEPENDENT_AMBULATORY_CARE_PROVIDER_SITE_OTHER): Payer: PPO

## 2023-06-05 DIAGNOSIS — D539 Nutritional anemia, unspecified: Secondary | ICD-10-CM

## 2023-06-05 LAB — FECAL OCCULT BLOOD, IMMUNOCHEMICAL: Fecal Occult Bld: NEGATIVE

## 2023-06-18 ENCOUNTER — Other Ambulatory Visit: Payer: Self-pay | Admitting: Physician Assistant

## 2023-06-18 DIAGNOSIS — D539 Nutritional anemia, unspecified: Secondary | ICD-10-CM

## 2023-06-18 MED ORDER — SCOPOLAMINE 1 MG/3DAYS TD PT72: 1.0000 | MEDICATED_PATCH | TRANSDERMAL | 12 refills | Status: DC

## 2023-07-03 ENCOUNTER — Ambulatory Visit (HOSPITAL_BASED_OUTPATIENT_CLINIC_OR_DEPARTMENT_OTHER): Payer: PPO | Admitting: Student

## 2023-07-03 ENCOUNTER — Ambulatory Visit (HOSPITAL_BASED_OUTPATIENT_CLINIC_OR_DEPARTMENT_OTHER): Payer: PPO

## 2023-07-03 DIAGNOSIS — M25511 Pain in right shoulder: Secondary | ICD-10-CM

## 2023-07-03 DIAGNOSIS — S42201A Unspecified fracture of upper end of right humerus, initial encounter for closed fracture: Secondary | ICD-10-CM | POA: Diagnosis not present

## 2023-07-03 NOTE — Progress Notes (Signed)
Chief Complaint: Right shoulder pain     History of Present Illness:    Nicholas Maxwell is a 77 y.o. male presents today for evaluation of right shoulder pain.  Patient was walking in the [place] earlier this afternoon on a moving sidewalk and had his foot catch, causing him to fall forward onto his right shoulder.  He was also wearing a backpack.  States that he felt a pop in the outside of his shoulder.  Since the injury, he has been unable to reach overhead.  He has also had difficulty holding the steering wheel and taking off his jacket.  He is right-hand dominant.  Denies any history of shoulder injuries or issues.  Did report some brief numbness and tingling in his right thumb and index finger, however this has resolved.   Surgical History:   None  PMH/PSH/Family History/Social History/Meds/Allergies:    Past Medical History:  Diagnosis Date   Allergy 12/1998   Seasonal spring and fall. Crestor and statins.   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   Essential hypertension 05/12/2015   History of kidney stones    Myocardial infarction [facility]) 04/2017   Prostate cancer (HCC) 05/2020   Statin intolerance    Used >2 different statins   Type 2 diabetes mellitus without complication, without long-term current use of insulin (HCC) 05/12/2015   Past Surgical History:  Procedure Laterality Date   COLONOSCOPY     March of 2018    CORONARY ANGIOPLASTY WITH STENT PLACEMENT  05/24/2017   CORONARY PRESSURE/FFR STUDY N/A 05/24/2017   Procedure: INTRAVASCULAR PRESSURE WIRE/FFR STUDY;  Surgeon: Elder Negus, MD;  [facility];  Service: Cardiovascular;  Laterality: N/A;  MID LAD   CORONARY STENT INTERVENTION N/A 05/24/2017   Procedure: CORONARY STENT INTERVENTION;  Surgeon: Elder Negus, MD;  Location: [facility];  Service: Cardiovascular;  Laterality: N/A;  MID Circumflex 4.0x22  Onyx MID LAD 2.75x15 Onyx   CYSTOSCOPY WITH URETEROSCOPY, STONE BASKETRY AND STENT PLACEMENT  ~ 1990   HERNIA REPAIR     JOINT REPLACEMENT  10/2019   Right knee   KNEE ARTHROSCOPY Right 2016   LEFT HEART CATH AND CORONARY ANGIOGRAPHY N/A 05/24/2017   Procedure: LEFT HEART CATH AND CORONARY ANGIOGRAPHY;  Surgeon: Elder Negus, MD;  [facility]: [facility];  Service: Cardiovascular;  Laterality: N/A;   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   Procedure: TOTAL KNEE ARTHROPLASTY;  Surgeon: Durene Romans, MD;  Location: [facility];  Service: Orthopedics;  Laterality: Right;  70 mins   UMBILICAL HERNIA REPAIR  1992   UPPER GASTROINTESTINAL ENDOSCOPY     Social History   Socioeconomic History   Marital status: Married    Spouse name: Not on file   Number of children: 2   Years of education: Not on file   Highest education level: Not on file  Occupational History   Occupation: retired    Comment: retired  Tobacco Use   Smoking status: Former    Current packs/day: 0.00    Average packs/day: 1 pack/day for 30.0 years (30.0 ttl pk-yrs)    Types: Cigarettes    Start date: 10/03/1971    Quit date: 10/02/2001    Years since quitting: 21.7   Smokeless tobacco:  Never  Vaping Use   Vaping status: Never Used  Substance and Sexual Activity   Alcohol use: Not Currently    Comment: has not drank since 2014   Drug use: No   Sexual activity: Not Currently  Other Topics Concern   Not on file  Social History Narrative   Married   Son and daughter   [name]   Worked in Optician, dispensing   Social Determinants of Health   Financial Resource Strain: Low Risk  (12/06/2022)   Overall Financial Resource Strain (CARDIA)    Difficulty of Paying Living Expenses: Not hard at all  Food Insecurity: No Food Insecurity (12/06/2022)   Hunger Vital Sign    Worried About Running Out of Food in the Last Year: Never true    Ran Out of Food in the Last Year: Never true  Transportation Needs: No  Transportation Needs (12/06/2022)   PRAPARE - Administrator, Civil Service (Medical): No    Lack of Transportation (Non-Medical): No  Physical Activity: Sufficiently Active (12/06/2022)   Exercise Vital Sign    Days of Exercise per Week: 4 days    Minutes of Exercise per Session: 60 min  Stress: No Stress Concern Present (12/06/2022)   [employer] of Occupational Health - Occupational Stress Questionnaire    Feeling of Stress : Not at all  Social Connections: Socially Integrated (12/06/2022)   Social Connection and Isolation Panel [NHANES]    Frequency of Communication with Friends and Family: More than three times a week    Frequency of Social Gatherings with Friends and Family: More than three times a week    Attends Religious Services: More than 4 times per year    Active Member of [employer] or Organizations: Yes    Attends Engineer, structural: More than 4 times per year    Marital Status: Married   Family History  Problem Relation Age of Onset   Transient ischemic attack Father 36   Bone cancer Father    Cancer Father    Stroke Father    CAD Mother 50       CABG   Diabetes Mother    COPD Mother    Breast cancer Neg Hx    Colon cancer Neg Hx    Pancreatic cancer Neg Hx    Prostate cancer Neg Hx    Allergies  Allergen Reactions   Crestor [Rosuvastatin Calcium] Anaphylaxis and Swelling    Tongue swells/throat closes   Statins Other (See Comments)    Muscle/joint pain with ALL other statin drug but Crestor is severe   Current Outpatient Medications  Medication Sig Dispense Refill   ALFUZOSIN HCL ER PO Take 10 mg by mouth daily in the afternoon. Prescribed by Dr. Annabell Howells     allopurinol (ZYLOPRIM) 300 MG tablet TAKE 1 TABLET BY MOUTH DAILY WITH LUNCH. 90 tablet 1   ASPIRIN 81 81 MG chewable tablet Chew 81 mg by mouth daily.     Blood Glucose Monitoring Suppl (ONE TOUCH ULTRA 2) w/Device KIT USE TO CHECK BLOOD SUGAR 1 kit 0   Evolocumab (REPATHA SURECLICK)  140 MG/ML SOAJ Inject 140 mg into the skin once a week. 6 mL 3   glucose blood (ONETOUCH ULTRA) test strip USE TO CHECK BLOOD SUGAR DAILY AND PRN 100 each 4   losartan (COZAAR) 25 MG tablet TAKE 1 TABLET (25 MG TOTAL) BY MOUTH DAILY. 90 tablet 1   metFORMIN (GLUCOPHAGE) 500 MG tablet Take 500 mg by mouth  daily with breakfast.     metoprolol succinate (TOPROL-XL) 25 MG 24 hr tablet TAKE 1 TABLET BY MOUTH EVERY DAY 90 tablet 2   nitroGLYCERIN (NITROSTAT) 0.4 MG SL tablet Place 1 tablet (0.4 mg total) under the tongue every 5 (five) minutes x 3 doses as needed for chest pain. 30 tablet 2   ranolazine (RANEXA) 1000 MG SR tablet TAKE 1 TABLET BY MOUTH TWICE A DAY 180 tablet 1   scopolamine (TRANSDERM-SCOP) 1 MG/3DAYS Place 1 patch (1.5 mg total) onto the skin every 3 (three) days. 10 patch 12   No current facility-administered medications for this visit.   No results found.  Review of Systems:   A ROS was performed including pertinent positives and negatives as documented in the HPI.  Physical Exam :   Constitutional: NAD and appears stated age Neurological: Alert and oriented Psych: Appropriate affect and cooperative There were no vitals taken for this visit.   Comprehensive Musculoskeletal Exam:     Musculoskeletal Exam    Inspection Right Left  Skin No atrophy or winging No atrophy or winging  Palpation    Tenderness Lateral deltoid None  Range of Motion    Flexion (passive) 120 160  Flexion (active) 90 160  Abduction 90 1  ER at the side 20 30  Can reach behind back to L5 L5  Strength     3/5 5/5  Special Tests    Pseudoparalytic No No  Neurologic    Fires PIN, radial, median, ulnar, musculocutaneous, axillary, suprascapular, long thoracic, and spinal accessory innervated muscles. No abnormal sensibility  Vascular/Lymphatic    Radial Pulse 2+ 2+  Cervical Exam    Patient has symmetric cervical range of motion with negative Spurling's test.  Special Test: Negative liftoff  test     Imaging:   Xray (right shoulder 3 views): Greater tuberosity avulsion fracture.  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    I personally reviewed and interpreted the radiographs.   Assessment:   77 y.o. male with an acute right shoulder injury that occurred during a fall earlier this afternoon.  Radiographs taken today do show an apparent avulsion of the greater tuberosity.  I am suspicious that he suffered a significant rotator cuff injury, and therefore would like to proceed with MRI for further assessment.  Patient states that he tolerates ibuprofen well and has some at home, so he will begin with this for pain control.  Also consider use of a sling, however states that he does not feel this is necessary at this time.  Will plan to return shortly after MRI for review and treatment discussion with Dr. Steward Drone.  Plan :    - Obtain STAT MRI of the right shoulder and return to clinic for review with Dr. Steward Drone     I personally saw and evaluated the patient, and participated in the management and treatment plan.  Hazle Nordmann, PA-C Orthopedics

## 2023-07-05 ENCOUNTER — Ambulatory Visit (HOSPITAL_COMMUNITY)
Admission: RE | Admit: 2023-07-05 | Discharge: 2023-07-05 | Disposition: A | Discharge status: Home / Self Care | Payer: PPO | Source: Ambulatory Visit | Attending: Student | Admitting: Student

## 2023-07-05 DIAGNOSIS — M19011 Primary osteoarthritis, right shoulder: Secondary | ICD-10-CM | POA: Diagnosis not present

## 2023-07-05 DIAGNOSIS — M25511 Pain in right shoulder: Secondary | ICD-10-CM | POA: Insufficient documentation

## 2023-07-05 DIAGNOSIS — S46811A Strain of other muscles, fascia and tendons at shoulder and upper arm level, right arm, initial encounter: Secondary | ICD-10-CM | POA: Diagnosis not present

## 2023-07-05 DIAGNOSIS — M7581 Other shoulder lesions, right shoulder: Secondary | ICD-10-CM | POA: Diagnosis not present

## 2023-07-06 ENCOUNTER — Encounter (HOSPITAL_BASED_OUTPATIENT_CLINIC_OR_DEPARTMENT_OTHER): Payer: Self-pay

## 2023-07-06 ENCOUNTER — Other Ambulatory Visit: Payer: Self-pay | Admitting: Cardiology

## 2023-07-13 ENCOUNTER — Encounter (HOSPITAL_BASED_OUTPATIENT_CLINIC_OR_DEPARTMENT_OTHER): Payer: Self-pay | Admitting: Student

## 2023-07-13 ENCOUNTER — Ambulatory Visit (HOSPITAL_BASED_OUTPATIENT_CLINIC_OR_DEPARTMENT_OTHER): Payer: PPO | Admitting: Student

## 2023-07-13 DIAGNOSIS — S42251A Displaced fracture of greater tuberosity of right humerus, initial encounter for closed fracture: Secondary | ICD-10-CM

## 2023-07-14 NOTE — Progress Notes (Signed)
Chief Complaint: Right shoulder pain     History of Present Illness:   07/13/2023: Patient is here today for MRI review of the right shoulder.  He states that he is doing some better.  Pain has decreased into a moderate, dull, constant ache.  He does still lack a lot of range of motion.  He has been taking Tylenol for pain.   07/03/2023: Nicholas Maxwell is a 77 y.o. male presents today for evaluation of right shoulder pain.  Patient was walking in the [place] earlier this afternoon on a moving sidewalk and had his foot catch, causing him to fall forward onto his right shoulder.  He was also wearing a backpack.  States that he felt a pop in the outside of his shoulder.  Since the injury, he has been unable to reach overhead.  He has also had difficulty holding the steering wheel and taking off his jacket.  He is right-hand dominant.  Denies any history of shoulder injuries or issues.  Did report some brief numbness and tingling in his right thumb and index finger, however this has resolved.   Surgical History:   None  PMH/PSH/Family History/Social History/Meds/Allergies:    Past Medical History:  Diagnosis Date   Allergy 12/1998   Seasonal spring and fall. Crestor and statins.   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   Essential hypertension 05/12/2015   History of kidney stones    Myocardial infarction [facility]) 04/2017   Prostate cancer (HCC) 05/2020   Statin intolerance    Used >2 different statins   Type 2 diabetes mellitus without complication, without long-term current use of insulin (HCC) 05/12/2015   Past Surgical History:  Procedure Laterality Date   COLONOSCOPY     March of 2018    CORONARY ANGIOPLASTY WITH STENT PLACEMENT  05/24/2017   CORONARY PRESSURE/FFR STUDY N/A 05/24/2017   Procedure: INTRAVASCULAR PRESSURE WIRE/FFR STUDY;  Surgeon: Elder Negus, MD;  [facility];  Service:  Cardiovascular;  Laterality: N/A;  MID LAD   CORONARY STENT INTERVENTION N/A 05/24/2017   Procedure: CORONARY STENT INTERVENTION;  Surgeon: Elder Negus, MD;  [facility]: [facility];  Service: Cardiovascular;  Laterality: N/A;  MID Circumflex 4.0x22 Onyx MID LAD 2.75x15 Onyx   CYSTOSCOPY WITH URETEROSCOPY, STONE BASKETRY AND STENT PLACEMENT  ~ 1990   HERNIA REPAIR     JOINT REPLACEMENT  10/2019   Right knee   KNEE ARTHROSCOPY Right 2016   LEFT HEART CATH AND CORONARY ANGIOGRAPHY N/A 05/24/2017   Procedure: LEFT HEART CATH AND CORONARY ANGIOGRAPHY;  Surgeon: Elder Negus, MD;  [facility]: [facility];  Service: Cardiovascular;  Laterality: N/A;   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   Procedure: TOTAL KNEE ARTHROPLASTY;  Surgeon: Durene Romans, MD;  Location: [facility];  Service: Orthopedics;  Laterality: Right;  70 mins   UMBILICAL HERNIA REPAIR  1992   UPPER GASTROINTESTINAL ENDOSCOPY     Social History   Socioeconomic History   Marital status: Married    Spouse name: Not on file   Number of children: 2   Years of education: Not on file   Highest education level: Not on file  Occupational History   Occupation: retired    Comment: retired  Tobacco Use  Smoking status: Former    Current packs/day: 0.00    Average packs/day: 1 pack/day for 30.0 years (30.0 ttl pk-yrs)    Types: Cigarettes    Start date: 10/03/1971    Quit date: 10/02/2001    Years since quitting: 21.7   Smokeless tobacco: Never  Vaping Use   Vaping status: Never Used  Substance and Sexual Activity   Alcohol use: Not Currently    Comment: has not drank since 2014   Drug use: No   Sexual activity: Not Currently  Other Topics Concern   Not on file  Social History Narrative   Married   Son and daughter   [name]   Worked in Optician, dispensing   Social Determinants of Health   Financial Resource Strain: Low Risk  (12/06/2022)   Overall Financial Resource Strain (CARDIA)     Difficulty of Paying Living Expenses: Not hard at all  Food Insecurity: No Food Insecurity (12/06/2022)   Hunger Vital Sign    Worried About Running Out of Food in the Last Year: Never true    Ran Out of Food in the Last Year: Never true  Transportation Needs: No Transportation Needs (12/06/2022)   PRAPARE - Administrator, Civil Service (Medical): No    Lack of Transportation (Non-Medical): No  Physical Activity: Sufficiently Active (12/06/2022)   Exercise Vital Sign    Days of Exercise per Week: 4 days    Minutes of Exercise per Session: 60 min  Stress: No Stress Concern Present (12/06/2022)   [employer] of Occupational Health - Occupational Stress Questionnaire    Feeling of Stress : Not at all  Social Connections: Socially Integrated (12/06/2022)   Social Connection and Isolation Panel [NHANES]    Frequency of Communication with Friends and Family: More than three times a week    Frequency of Social Gatherings with Friends and Family: More than three times a week    Attends Religious Services: More than 4 times per year    Active Member of [employer] or Organizations: Yes    Attends Engineer, structural: More than 4 times per year    Marital Status: Married   Family History  Problem Relation Age of Onset   Transient ischemic attack Father 34   Bone cancer Father    Cancer Father    Stroke Father    CAD Mother 20       CABG   Diabetes Mother    COPD Mother    Breast cancer Neg Hx    Colon cancer Neg Hx    Pancreatic cancer Neg Hx    Prostate cancer Neg Hx    Allergies  Allergen Reactions   Crestor [Rosuvastatin Calcium] Anaphylaxis and Swelling    Tongue swells/throat closes   Statins Other (See Comments)    Muscle/joint pain with ALL other statin drug but Crestor is severe   Current Outpatient Medications  Medication Sig Dispense Refill   ALFUZOSIN HCL ER PO Take 10 mg by mouth daily in the afternoon. Prescribed by Dr. Annabell Howells     allopurinol (ZYLOPRIM)  300 MG tablet TAKE 1 TABLET BY MOUTH DAILY WITH LUNCH. 90 tablet 1   ASPIRIN 81 81 MG chewable tablet Chew 81 mg by mouth daily.     Blood Glucose Monitoring Suppl (ONE TOUCH ULTRA 2) w/Device KIT USE TO CHECK BLOOD SUGAR 1 kit 0   Evolocumab (REPATHA SURECLICK) 140 MG/ML SOAJ Inject 140 mg into the skin every 14 (fourteen) days. 6 mL  3   glucose blood (ONETOUCH ULTRA) test strip USE TO CHECK BLOOD SUGAR DAILY AND PRN 100 each 4   losartan (COZAAR) 25 MG tablet TAKE 1 TABLET (25 MG TOTAL) BY MOUTH DAILY. 90 tablet 1   metFORMIN (GLUCOPHAGE) 500 MG tablet Take 500 mg by mouth daily with breakfast.     metoprolol succinate (TOPROL-XL) 25 MG 24 hr tablet TAKE 1 TABLET BY MOUTH EVERY DAY 90 tablet 2   nitroGLYCERIN (NITROSTAT) 0.4 MG SL tablet Place 1 tablet (0.4 mg total) under the tongue every 5 (five) minutes x 3 doses as needed for chest pain. 30 tablet 2   ranolazine (RANEXA) 1000 MG SR tablet TAKE 1 TABLET BY MOUTH TWICE A DAY 180 tablet 1   scopolamine (TRANSDERM-SCOP) 1 MG/3DAYS Place 1 patch (1.5 mg total) onto the skin every 3 (three) days. 10 patch 12   No current facility-administered medications for this visit.   No results found.  Review of Systems:   A ROS was performed including pertinent positives and negatives as documented in the HPI.  Physical Exam :   Constitutional: NAD and appears stated age Neurological: Alert and oriented Psych: Appropriate affect and cooperative There were no vitals taken for this visit.   Comprehensive Musculoskeletal Exam:     Musculoskeletal Exam    Inspection Right Left  Skin No atrophy or winging No atrophy or winging  Palpation    Tenderness Lateral deltoid None  Range of Motion    Flexion (passive) 120 160  Flexion (active) 90 160  Abduction 90 120  ER at the side 20 30  Can reach behind back to L5 L5  Strength     3/5 5/5  Special Tests    Pseudoparalytic No No  Neurologic    Fires PIN, radial, median, ulnar, musculocutaneous,  axillary, suprascapular, long thoracic, and spinal accessory innervated muscles. No abnormal sensibility  Vascular/Lymphatic    Radial Pulse 2+ 2+  Cervical Exam    Patient has symmetric cervical range of motion with negative Spurling's test.  Special Test:     Imaging:   MRI (right shoulder): Comminuted greater tuberosity fracture with mild displacement.  Partial tearing of the supraspinatus tendon.  Glenohumeral and AC joint osteoarthritis.    I personally reviewed and interpreted the radiographs.   Assessment:   77 y.o. male with a greater tuberosity fracture of the right shoulder.  This was sustained in a fall on 10/1 and appears mildly displaced.  Given the absence of significant displacement, he is a good candidate for nonoperative management.  Discussed that we will begin treatment by limiting motion of the shoulder until he begins to show signs of adequate radiographic healing, at which time we can gently begin progressing range of motion.  He is limiting range of motion without use of the sling and is tolerating this well.  I will plan to see him back in 3 weeks to assess healing on x-ray.   Plan :    - Return to clinic in 3 weeks for xray and follow up assessment     I personally saw and evaluated the patient, and participated in the management and treatment plan.  Hazle Nordmann, PA-C Orthopedics

## 2023-07-17 ENCOUNTER — Ambulatory Visit (INDEPENDENT_AMBULATORY_CARE_PROVIDER_SITE_OTHER): Payer: PPO | Admitting: *Deleted

## 2023-07-17 DIAGNOSIS — Z23 Encounter for immunization: Secondary | ICD-10-CM

## 2023-07-18 NOTE — Progress Notes (Addendum)
[facility] CONSULT NOTE  Patient Care Team: Jarold Motto, [place] as PCP - General (Physician Assistant) Bjorn Pippin, MD as Attending Physician (Urology) Rutherford Guys., MD as Referring Physician (Gastroenterology) Elder Negus, MD as Consulting Physician (Cardiology) Alric Quan, [facility] (Gastroenterology) Durene Romans, MD as Consulting Physician (Orthopedic Surgery) Erroll Luna, [facility] (Inactive) (Pharmacist) [facility] as Consulting Physician (Optometry)  ASSESSMENT & PLAN:  77 y.o. 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  Laboratory studies show microcytic anemia. Normal LFT, B12. He has CKD. No clear findings so far. PSA 0.15 in 05/2023.  Nicholas Maxwell has no concerning symptoms, overall he is feeling well.  His health appears to be stable by history and we discussed differential diagnosis today.  If no concerning findings from laboratory testing, then he should follow-up in 3 months.  This could be early signs of bone marrow disorder, however may not indicative of immediate bone marrow biopsy at this time pending additional testing.  If there is slight abnormalities in SPEP, free light chains, I will request telephone call appointment to discuss follow-up plan.  Lab ordered as below.  No problem-specific Assessment & Plan notes found for this encounter.  Orders Placed This Encounter  Procedures   CBC with Differential (Cancer Center Only)    Standing Status:   Future    Standing Expiration Date:   07/18/2024   Folate    Standing Status:   Future    Standing Expiration Date:   07/18/2024   Copper, serum    Standing Status:   Future    Standing Expiration Date:   07/18/2024   T4, free    Standing Status:   Standing    Number of  Occurrences:   22    Standing Expiration Date:   07/18/2024   TSH    Standing Status:   Standing    Number of Occurrences:   22    Standing Expiration Date:   07/18/2024   Lactate dehydrogenase    Standing Status:   Future    Standing Expiration Date:   07/18/2024   Pathologist smear review    Standing Status:   Future    Standing Expiration Date:   07/18/2024   CMP (Cancer Center only)    Standing Status:   Future    Standing Expiration Date:   07/18/2024   Ferritin    Standing Status:   Future    Standing Expiration Date:   07/18/2024   All questions were answered. The patient knows to call the clinic with any problems, questions or concerns.  Melven Sartorius, MD 10/17/202410:00 AM   CHIEF COMPLAINTS/PURPOSE OF CONSULTATION:  Anemia  HISTORY OF PRESENTING ILLNESS:  Nicholas Maxwell 77 y.o. male is here because of anemia. Labs showed macrocytosis. Hemoglobin has been slightly. He reports just feels tired. He was more tired during ADT and needed a nap then but not anymore. 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   No night sweats, unexpected weight loss, decreased appetite, mass or lump, abdominal distention, unexpected bleeding or bruising. Wife reports his stomach is bigger. No stomach pain, nausea, vomiting, bloody or dark stool. Colonoscopy is up to date.  He eats fruits and vegetable everyday, not a vegetarian. He does not take NSAID. He takes  aspirin 81 mg once daily.    MEDICAL HISTORY:  Past Medical History:  Diagnosis Date   Allergy 12/1998   Seasonal spring and fall. Crestor and statins.   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) 04/2017   Prostate cancer (HCC) 05/2020   Statin intolerance    Used >2 different statins   Type 2 diabetes mellitus without complication, without long-term current use of insulin (HCC) 05/12/2015    SURGICAL  HISTORY: Past Surgical History:  Procedure Laterality Date   COLONOSCOPY     March of 2018    CORONARY ANGIOPLASTY WITH STENT PLACEMENT  05/24/2017   CORONARY PRESSURE/FFR STUDY N/A 05/24/2017   Procedure: INTRAVASCULAR PRESSURE WIRE/FFR STUDY;  Surgeon: Elder Negus, MD;  Location: [facility] INVASIVE CV [facility];  Service: Cardiovascular;  Laterality: N/A;  MID LAD   CORONARY STENT INTERVENTION N/A 05/24/2017   Procedure: CORONARY STENT INTERVENTION;  Surgeon: Elder Negus, MD;  Location: [facility] INVASIVE CV LAB;  Service: Cardiovascular;  Laterality: N/A;  MID Circumflex 4.0x22 Onyx MID LAD 2.75x15 Onyx   CYSTOSCOPY WITH URETEROSCOPY, STONE BASKETRY AND STENT PLACEMENT  ~ 1990   HERNIA REPAIR     JOINT REPLACEMENT  10/2019   Right knee   KNEE ARTHROSCOPY Right 2016   LEFT HEART CATH AND CORONARY ANGIOGRAPHY N/A 05/24/2017   Procedure: LEFT HEART CATH AND CORONARY ANGIOGRAPHY;  Surgeon: Elder Negus, MD;  Location: [facility] INVASIVE CV LAB;  Service: Cardiovascular;  Laterality: N/A;   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   Procedure: TOTAL KNEE ARTHROPLASTY;  Surgeon: Durene Romans, MD;  Location: [facility] ORS;  Service: Orthopedics;  Laterality: Right;  70 mins   UMBILICAL HERNIA REPAIR  1992   UPPER GASTROINTESTINAL ENDOSCOPY      SOCIAL HISTORY: Social History   Socioeconomic History   Marital status: Married    Spouse name: Not on file   Number of children: 2   Years of education: Not on file   Highest education level: Not on file  Occupational History   Occupation: retired    Comment: retired  Tobacco Use   Smoking status: Former    Current packs/day: 0.00    Average packs/day: 1 pack/day for 30.0 years (30.0 ttl pk-yrs)    Types: Cigarettes    Start date: 10/03/1971    Quit date: 10/02/2001    Years since quitting: 21.8   Smokeless tobacco: Never  Vaping Use   Vaping status: Never Used  Substance and Sexual Activity   Alcohol use: Not Currently    Comment: has not  drank since 2014   Drug use: No   Sexual activity: Not Currently  Other Topics Concern   Not on file  Social History Narrative   Married   Son and daughter   Nicholas Maxwell   Worked in Optician, dispensing   Social Determinants of Health   Financial Resource Strain: Low Risk  (12/06/2022)   Overall Financial Resource Strain (CARDIA)    Difficulty of Paying Living Expenses: Not hard at all  Food Insecurity: No Food Insecurity (12/06/2022)   Hunger Vital Sign    Worried About Running Out of Food in the Last Year: Never true    Ran Out of Food in the Last Year: Never true  Transportation Needs: No Transportation Needs (12/06/2022)   PRAPARE - Administrator, Civil Service (Medical): No  Lack of Transportation (Non-Medical): No  Physical Activity: Sufficiently Active (12/06/2022)   Exercise Vital Sign    Days of Exercise per Week: 4 days    Minutes of Exercise per Session: 60 min  Stress: No Stress Concern Present (12/06/2022)   [employer] of Occupational Health - Occupational Stress Questionnaire    Feeling of Stress : Not at all  Social Connections: Socially Integrated (12/06/2022)   Social Connection and Isolation Panel [NHANES]    Frequency of Communication with Friends and Family: More than three times a week    Frequency of Social Gatherings with Friends and Family: More than three times a week    Attends Religious Services: More than 4 times per year    Active Member of [employer] or Organizations: Yes    Attends Engineer, structural: More than 4 times per year    Marital Status: Married  Catering manager Violence: Not At Risk (12/07/2022)   Humiliation, Afraid, Rape, and Kick questionnaire    Fear of Current or Ex-Partner: No    Emotionally Abused: No    Physically Abused: No    Sexually Abused: No    FAMILY HISTORY: Family History  Problem Relation Age of Onset   Transient ischemic attack Father 30   Bone cancer Father    Cancer Father    Stroke Father     CAD Mother 74       CABG   Diabetes Mother    COPD Mother    Breast cancer Neg Hx    Colon cancer Neg Hx    Pancreatic cancer Neg Hx    Prostate cancer Neg Hx     ALLERGIES:  is allergic to crestor [rosuvastatin calcium] and statins.  MEDICATIONS:  Current Outpatient Medications  Medication Sig Dispense Refill   ALFUZOSIN HCL ER PO Take 10 mg by mouth daily in the afternoon. Prescribed by Dr. Annabell Howells     allopurinol (ZYLOPRIM) 300 MG tablet TAKE 1 TABLET BY MOUTH DAILY WITH LUNCH. 90 tablet 1   ASPIRIN 81 81 MG chewable tablet Chew 81 mg by mouth daily.     Blood Glucose Monitoring Suppl (ONE TOUCH ULTRA 2) w/Device KIT USE TO CHECK BLOOD SUGAR 1 kit 0   Evolocumab (REPATHA SURECLICK) 140 MG/ML SOAJ Inject 140 mg into the skin every 14 (fourteen) days. 6 mL 3   glucose blood (ONETOUCH ULTRA) test strip USE TO CHECK BLOOD SUGAR DAILY AND PRN 100 each 4   losartan (COZAAR) 25 MG tablet TAKE 1 TABLET (25 MG TOTAL) BY MOUTH DAILY. 90 tablet 1   metFORMIN (GLUCOPHAGE) 500 MG tablet Take 500 mg by mouth daily with breakfast.     metoprolol succinate (TOPROL-XL) 25 MG 24 hr tablet TAKE 1 TABLET BY MOUTH EVERY DAY 90 tablet 2   nitroGLYCERIN (NITROSTAT) 0.4 MG SL tablet Place 1 tablet (0.4 mg total) under the tongue every 5 (five) minutes x 3 doses as needed for chest pain. 30 tablet 2   ranolazine (RANEXA) 1000 MG SR tablet TAKE 1 TABLET BY MOUTH TWICE A DAY 180 tablet 1   scopolamine (TRANSDERM-SCOP) 1 MG/3DAYS Place 1 patch (1.5 mg total) onto the skin every 3 (three) days. 10 patch 12   No current facility-administered medications for this visit.    REVIEW OF SYSTEMS:   All other systems were reviewed with the patient and are negative.  PHYSICAL EXAMINATION: ECOG PERFORMANCE STATUS: 0 - Asymptomatic  Vitals:   07/19/23 0910  BP: (!) 150/62  Pulse: Marland Kitchen)  57  Resp: 20  Temp: (!) 97.5 F (36.4 C)  SpO2: 98%   Filed Weights   07/19/23 0910  Weight: 202 lb 12.8 oz (92 kg)     GENERAL: alert, no distress and comfortable SKIN: skin color normal EYES: normal conjunctiva, sclera clear OROPHARYNX: no exudate, no erythema, and tongue normal  NECK: supple LYMPH:  no palpable cervical, axillary lymphadenopathy LUNGS: clear to auscultation with normal breathing effort HEART: regular rate & rhythm and no murmurs and no lower extremity edema ABDOMEN: abdomen soft, non-tender and nondistended  RADIOGRAPHIC STUDIES: I have personally reviewed the radiological images as listed and agreed with the findings in the report. MR Shoulder Right Wo Contrast  Result Date: 07/05/2023 CLINICAL DATA:  Chronic right shoulder pain. Rotator cuff disorder suspected. Fall a couple of days ago. Caught self and heard a pop. Lateral pain. Cannot lift arm. EXAM: MRI OF THE RIGHT SHOULDER WITHOUT CONTRAST TECHNIQUE: Multiplanar, multisequence MR imaging of the shoulder was performed. No intravenous contrast was administered. COMPARISON:  Right shoulder radiographs 07/03/2023 FINDINGS: Rotator cuff: There is mild-to-moderate attenuation of the articular fibers of the mid and anterior supraspinatus tendon footprint (coronal series 8 images 9 through 11). There is moderate intermediate T2 signal tendinosis of the anterior infraspinatus, with small linear interstitial tears (coronal images 5 through 7, sagittal series 9, image 7). Mild superior subscapularis insertional tendinosis. The teres minor is intact. Muscles:  Minimal supraspinatus muscle atrophy. Biceps long head: The intra-articular long head of the biceps tendon is intact. Acromioclavicular Joint: There are mild-to-moderate degenerative changes of the acromioclavicular joint including joint space narrowing, subchondral marrow edema, and peripheral osteophytosis. Type II acromion. Mild-to-moderate fluid within the subacromial/subdeltoid bursa, especially along the lateral aspect of the humeral head in the region of the acute fracture described  below. Glenohumeral Joint: Mild-to-moderate thinning of the glenohumeral cartilage, greatest within the posterior glenoid and inferior medial humeral head. Labrum: There is mild degenerative irregularity of the anterior inferior glenoid labrum (axial images 12 through 15). Trace linear increased T2 signal indicating a tiny likely degenerative tear within the posteroinferior glenoid labrum (axial series 5, image 13). Bones: Note is made on recent 07/03/2023 radiographs of a curvilinear lucency of an acute fracture within the superolateral humeral head at the greater tuberosity with up to 4 mm lateral cortical step-off. On the current MRI there are multiple curvilinear decreased T1 increased T2 signal comminuted fracture lines within this region of the superficial greater tuberosity with mild superolateral humeral head 4 mm cortical step-off and similar approximately 4 mm mild diastasis of the fracture components (coronal images 6 through 10, sagittal images 6 through 9). Other: None. IMPRESSION: 1. Acute comminuted fracture of the superficial humeral head greater tuberosity with mild superolateral humeral head cortical step-off and mild diastasis of the fracture components. 2. Mild-to-moderate partial-thickness tears of the articular sided fibers of the mid and anterior supraspinatus tendon footprint. 3. Moderate anterior infraspinatus tendinosis with small linear interstitial tears. No tendon retraction. 4. Mild-to-moderate degenerative changes of the acromioclavicular joint. 5. Mild-to-moderate glenohumeral cartilage degenerative changes. 6. Mild degenerative irregularity of the anterior inferior glenoid labrum. Trace linear increased T2 signal indicating a tiny likely degenerative tear within the posteroinferior glenoid labrum. Electronically Signed   By: Neita Garnet M.D.   On: 07/05/2023 13:51

## 2023-07-19 ENCOUNTER — Inpatient Hospital Stay: Payer: PPO

## 2023-07-19 ENCOUNTER — Ambulatory Visit: Payer: PPO

## 2023-07-19 VITALS — BP 150/62 | HR 57 | Temp 97.5°F | Resp 20 | Wt 202.8 lb

## 2023-07-19 DIAGNOSIS — Z8546 Personal history of malignant neoplasm of prostate: Secondary | ICD-10-CM

## 2023-07-19 DIAGNOSIS — Z79899 Other long term (current) drug therapy: Secondary | ICD-10-CM

## 2023-07-19 DIAGNOSIS — N189 Chronic kidney disease, unspecified: Secondary | ICD-10-CM

## 2023-07-19 DIAGNOSIS — Z809 Family history of malignant neoplasm, unspecified: Secondary | ICD-10-CM

## 2023-07-19 DIAGNOSIS — M6250 Muscle wasting and atrophy, not elsewhere classified, unspecified site: Secondary | ICD-10-CM

## 2023-07-19 DIAGNOSIS — I252 Old myocardial infarction: Secondary | ICD-10-CM

## 2023-07-19 DIAGNOSIS — D509 Iron deficiency anemia, unspecified: Secondary | ICD-10-CM

## 2023-07-19 DIAGNOSIS — D539 Nutritional anemia, unspecified: Secondary | ICD-10-CM

## 2023-07-19 DIAGNOSIS — G8929 Other chronic pain: Secondary | ICD-10-CM | POA: Diagnosis not present

## 2023-07-19 DIAGNOSIS — Z8249 Family history of ischemic heart disease and other diseases of the circulatory system: Secondary | ICD-10-CM

## 2023-07-19 DIAGNOSIS — I129 Hypertensive chronic kidney disease with stage 1 through stage 4 chronic kidney disease, or unspecified chronic kidney disease: Secondary | ICD-10-CM

## 2023-07-19 DIAGNOSIS — E1122 Type 2 diabetes mellitus with diabetic chronic kidney disease: Secondary | ICD-10-CM | POA: Diagnosis not present

## 2023-07-19 DIAGNOSIS — Z825 Family history of asthma and other chronic lower respiratory diseases: Secondary | ICD-10-CM

## 2023-07-19 DIAGNOSIS — Z833 Family history of diabetes mellitus: Secondary | ICD-10-CM | POA: Diagnosis not present

## 2023-07-19 DIAGNOSIS — Z923 Personal history of irradiation: Secondary | ICD-10-CM

## 2023-07-19 DIAGNOSIS — Z888 Allergy status to other drugs, medicaments and biological substances status: Secondary | ICD-10-CM

## 2023-07-19 DIAGNOSIS — R609 Edema, unspecified: Secondary | ICD-10-CM

## 2023-07-19 DIAGNOSIS — M25511 Pain in right shoulder: Secondary | ICD-10-CM | POA: Diagnosis not present

## 2023-07-19 DIAGNOSIS — Z87442 Personal history of urinary calculi: Secondary | ICD-10-CM | POA: Diagnosis not present

## 2023-07-19 DIAGNOSIS — Z87891 Personal history of nicotine dependence: Secondary | ICD-10-CM

## 2023-07-19 DIAGNOSIS — Z808 Family history of malignant neoplasm of other organs or systems: Secondary | ICD-10-CM | POA: Diagnosis not present

## 2023-07-19 DIAGNOSIS — Z823 Family history of stroke: Secondary | ICD-10-CM | POA: Diagnosis not present

## 2023-07-19 LAB — CMP (CANCER CENTER ONLY)
ALT: 14 U/L (ref 0–44)
AST: 13 U/L — ABNORMAL LOW (ref 15–41)
Albumin: 4.6 g/dL (ref 3.5–5.0)
Alkaline Phosphatase: 79 U/L (ref 38–126)
Anion gap: 6 (ref 5–15)
BUN: 33 mg/dL — ABNORMAL HIGH (ref 8–23)
CO2: 29 mmol/L (ref 22–32)
Calcium: 10 mg/dL (ref 8.9–10.3)
Chloride: 101 mmol/L (ref 98–111)
Creatinine: 1.56 mg/dL — ABNORMAL HIGH (ref 0.61–1.24)
GFR, Estimated: 46 mL/min — ABNORMAL LOW (ref >60)
Glucose, Bld: 245 mg/dL — ABNORMAL HIGH (ref 70–99)
Potassium: 4.6 mmol/L (ref 3.5–5.1)
Sodium: 136 mmol/L (ref 135–145)
Total Bilirubin: 0.5 mg/dL (ref 0.3–1.2)
Total Protein: 7.9 g/dL (ref 6.5–8.1)

## 2023-07-19 LAB — FERRITIN: Ferritin: 97 ng/mL (ref 24–336)

## 2023-07-19 LAB — CBC WITH DIFFERENTIAL (CANCER CENTER ONLY)
Abs Immature Granulocytes: 0.02 10*3/uL (ref 0.00–0.07)
Basophils Absolute: 0 10*3/uL (ref 0.0–0.1)
Basophils Relative: 0 %
Eosinophils Absolute: 0 10*3/uL (ref 0.0–0.5)
Eosinophils Relative: 1 %
HCT: 37.5 % — ABNORMAL LOW (ref 39.0–52.0)
Hemoglobin: 12.7 g/dL — ABNORMAL LOW (ref 13.0–17.0)
Immature Granulocytes: 0 %
Lymphocytes Relative: 17 %
Lymphs Abs: 1 10*3/uL (ref 0.7–4.0)
MCH: 34 pg (ref 26.0–34.0)
MCHC: 33.9 g/dL (ref 30.0–36.0)
MCV: 100.5 fL — ABNORMAL HIGH (ref 80.0–100.0)
Monocytes Absolute: 0.5 10*3/uL (ref 0.1–1.0)
Monocytes Relative: 8 %
Neutro Abs: 4.4 10*3/uL (ref 1.7–7.7)
Neutrophils Relative %: 74 %
Platelet Count: 207 10*3/uL (ref 150–400)
RBC: 3.73 MIL/uL — ABNORMAL LOW (ref 4.22–5.81)
RDW: 12.1 % (ref 11.5–15.5)
WBC Count: 6 10*3/uL (ref 4.0–10.5)
nRBC: 0 % (ref 0.0–0.2)

## 2023-07-19 LAB — TSH: TSH: 2.132 u[IU]/mL (ref 0.350–4.500)

## 2023-07-19 LAB — T4, FREE: Free T4: 0.81 ng/dL (ref 0.61–1.12)

## 2023-07-19 LAB — LACTATE DEHYDROGENASE: LDH: 149 U/L (ref 98–192)

## 2023-07-19 LAB — FOLATE: Folate: 17.5 ng/mL (ref >5.9)

## 2023-07-20 LAB — KAPPA/LAMBDA LIGHT CHAINS
Kappa free light chain: 27.3 mg/L — ABNORMAL HIGH (ref 3.3–19.4)
Kappa, lambda light chain ratio: 1.49 (ref 0.26–1.65)
Lambda free light chains: 18.3 mg/L (ref 5.7–26.3)

## 2023-07-21 LAB — COPPER, SERUM: Copper: 129 ug/dL (ref 69–132)

## 2023-07-24 LAB — MULTIPLE MYELOMA PANEL, SERUM
Albumin SerPl Elph-Mcnc: 4.1 g/dL (ref 2.9–4.4)
Albumin/Glob SerPl: 1.3 (ref 0.7–1.7)
Alpha 1: 0.2 g/dL (ref 0.0–0.4)
Alpha2 Glob SerPl Elph-Mcnc: 0.8 g/dL (ref 0.4–1.0)
B-Globulin SerPl Elph-Mcnc: 1.1 g/dL (ref 0.7–1.3)
Gamma Glob SerPl Elph-Mcnc: 1.1 g/dL (ref 0.4–1.8)
Globulin, Total: 3.2 g/dL (ref 2.2–3.9)
IgA: 213 mg/dL (ref 61–437)
IgG (Immunoglobin G), Serum: 1162 mg/dL (ref 603–1613)
IgM (Immunoglobulin M), Srm: 67 mg/dL (ref 15–143)
Total Protein ELP: 7.3 g/dL (ref 6.0–8.5)

## 2023-07-26 ENCOUNTER — Inpatient Hospital Stay: Payer: PPO

## 2023-07-26 DIAGNOSIS — D539 Nutritional anemia, unspecified: Secondary | ICD-10-CM

## 2023-07-26 NOTE — Progress Notes (Signed)
Patient Care Team: Jarold Motto, [place] as PCP - General (Physician Assistant) Bjorn Pippin, MD as Attending Physician (Urology) Rutherford Guys., MD as Referring Physician (Gastroenterology) Elder Negus, MD as Consulting Physician (Cardiology) Alric Quan, [facility] (Gastroenterology) Durene Romans, MD as Consulting Physician (Orthopedic Surgery) Erroll Luna, [facility] (Inactive) (Pharmacist) [facility] as Consulting Physician (Optometry)  Clinic Day:  07/26/2023  Referring physician: Jarold Motto, PA  ASSESSMENT & PLAN:   Assessment & Plan: 77 y.o. 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   Laboratory studies show microcytic anemia. Normal LFT, B12. He has CKD. No clear findings so far. PSA 0.15 in 05/2023.   [name] has no concerning symptoms, overall he is feeling well.  We went over the results today.  Slight abnormality in light chains but no monoclonal protein identified.  Since his hemoglobin is improved, will repeat labs about 2 weeks before next visit.   Ordering CBC, CMP, SPEP, free light chains and immunoglobulins Follow-up about 2 weeks for blood draw before next visit in January. Requested change to scheduling.  The patient understands the plans discussed today and is in agreement with them.  He knows to contact our office if he develops concerns prior to his next appointment.  I provided 16 minutes of face-to-face time during this encounter and > 50% was spent counseling as documented under my assessment and plan.    Melven Sartorius, MD  [facility] [address] [place] [address] Dept: [phone] Dept Fax: [phone]   No orders of the defined types were  placed in this encounter.     CHIEF COMPLAINT:  CC: anemia  INTERVAL HISTORY:  Laboratory studies showed normal folate, ferritin.  Borderline macrocytic anemia.  Hemoglobin improved compared to previously.  LFT was normal.  Mild renal insufficiency.  Kappa light chain slightly above normal.  No monoclonal protein identified.  The immunofixation pattern appears unremarkable. Evidence of monoclonal protein is not apparent.   He denies any changes  Initial history  Nicholas Maxwell 77 y.o. male was initially seen by me because of anemia in Oct 2024. Labs showed macrocytosis. Hemoglobin has been slightly. He reports just feels tired. He was more tired during ADT for prostate cancer and needed a nap then but not anymore. 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    No night sweats, unexpected weight loss, decreased appetite, mass or lump, abdominal distention, unexpected bleeding or bruising. Wife reports his stomach is bigger. No stomach pain, nausea, vomiting, bloody or dark stool. Colonoscopy is up to date.   He eats fruits and vegetable everyday, not a vegetarian. He does not take NSAID. He takes aspirin 81 mg once daily.   I have reviewed the past medical history, past surgical history, social history and family history with the patient and they are unchanged from previous note.  ALLERGIES:  is allergic to crestor [rosuvastatin calcium] and statins.  MEDICATIONS:  Current Outpatient Medications  Medication Sig Dispense Refill   ALFUZOSIN HCL ER PO Take 10 mg by mouth daily in the afternoon. Prescribed by Dr. Annabell Howells     allopurinol (ZYLOPRIM) 300 MG tablet TAKE 1 TABLET BY MOUTH DAILY WITH LUNCH. 90 tablet 1   ASPIRIN 81 81  MG chewable tablet Chew 81 mg by mouth daily.     Blood Glucose Monitoring Suppl (ONE TOUCH ULTRA 2) w/Device KIT USE TO CHECK BLOOD SUGAR 1 kit 0   Evolocumab (REPATHA SURECLICK) 140 MG/ML SOAJ Inject 140 mg into the skin every 14 (fourteen) days. 6 mL 3    glucose blood (ONETOUCH ULTRA) test strip USE TO CHECK BLOOD SUGAR DAILY AND PRN 100 each 4   losartan (COZAAR) 25 MG tablet TAKE 1 TABLET (25 MG TOTAL) BY MOUTH DAILY. 90 tablet 1   metFORMIN (GLUCOPHAGE) 500 MG tablet Take 500 mg by mouth daily with breakfast.     metoprolol succinate (TOPROL-XL) 25 MG 24 hr tablet TAKE 1 TABLET BY MOUTH EVERY DAY 90 tablet 2   nitroGLYCERIN (NITROSTAT) 0.4 MG SL tablet Place 1 tablet (0.4 mg total) under the tongue every 5 (five) minutes x 3 doses as needed for chest pain. 30 tablet 2   ranolazine (RANEXA) 1000 MG SR tablet TAKE 1 TABLET BY MOUTH TWICE A DAY 180 tablet 1   scopolamine (TRANSDERM-SCOP) 1 MG/3DAYS Place 1 patch (1.5 mg total) onto the skin every 3 (three) days. 10 patch 12   No current facility-administered medications for this visit.    HISTORY OF PRESENT ILLNESS:   Oncology History  Malignant neoplasm of prostate (HCC)  05/05/2020 Cancer Staging   Staging form: Prostate, AJCC 8th Edition - Clinical stage from 05/05/2020: Stage IIC (cT2a, cN0, cM0, PSA: 2.6, Grade Group: 3) - Signed by Marcello Fennel, PA-C on 06/29/2020   06/11/2020 Initial Diagnosis   Malignant neoplasm of prostate (HCC)       REVIEW OF SYSTEMS:   All relevant systems were reviewed with the patient and are negative.   VITALS:  There were no vitals taken for this visit.  Wt Readings from Last 3 Encounters:  07/19/23 202 lb 12.8 oz (92 kg)  05/24/23 203 lb (92.1 kg)  05/23/23 201 lb 4 oz (91.3 kg)    There is no height or weight on file to calculate BMI.  Performance status (ECOG): 0 - Asymptomatic  PHYSICAL EXAM:   NA  LABORATORY DATA:  I have reviewed the data as listed    Component Value Date/Time   NA 136 07/19/2023 1006   NA 135 (A) 10/27/2022 0000   K 4.6 07/19/2023 1006   CL 101 07/19/2023 1006   CO2 29 07/19/2023 1006   GLUCOSE 245 (H) 07/19/2023 1006   BUN 33 (H) 07/19/2023 1006   BUN 24 (A) 10/27/2022 0000   CREATININE 1.56 (H)  07/19/2023 1006   CREATININE 1.41 (H) 09/07/2020 1342   CALCIUM 10.0 07/19/2023 1006   PROT 7.9 07/19/2023 1006   ALBUMIN 4.6 07/19/2023 1006   AST 13 (L) 07/19/2023 1006   ALT 14 07/19/2023 1006   ALKPHOS 79 07/19/2023 1006   BILITOT 0.5 07/19/2023 1006   GFRNONAA 46 (L) 07/19/2023 1006   GFRNONAA 49 08/21/2018 0000   GFRAA >60 10/21/2019 1156    No results found for: "SPEP", "UPEP"  Lab Results  Component Value Date   WBC 6.0 07/19/2023   NEUTROABS 4.4 07/19/2023   HGB 12.7 (L) 07/19/2023   HCT 37.5 (L) 07/19/2023   MCV 100.5 (H) 07/19/2023   PLT 207 07/19/2023      Chemistry      Component Value Date/Time   NA 136 07/19/2023 1006   NA 135 (A) 10/27/2022 0000   K 4.6 07/19/2023 1006   CL 101 07/19/2023 1006  CO2 29 07/19/2023 1006   BUN 33 (H) 07/19/2023 1006   BUN 24 (A) 10/27/2022 0000   CREATININE 1.56 (H) 07/19/2023 1006   CREATININE 1.41 (H) 09/07/2020 1342   GLU 147 10/27/2022 0000      Component Value Date/Time   CALCIUM 10.0 07/19/2023 1006   ALKPHOS 79 07/19/2023 1006   AST 13 (L) 07/19/2023 1006   ALT 14 07/19/2023 1006   BILITOT 0.5 07/19/2023 1006       RADIOGRAPHIC STUDIES: I have personally reviewed the radiological images as listed and agreed with the findings in the report. DG Shoulder Right  Result Date: 07/22/2023 CLINICAL DATA:  Pain, fall. EXAM: RIGHT SHOULDER - 3 VIEW COMPARISON:  None Available. FINDINGS: Fracture identified of the proximal humerus greater tuberosity displaced by couple of mm. Osseous structures are otherwise unremarkable. IMPRESSION: Fracture greater tuberosity proximal humerus. Electronically Signed   By: Layla Maw M.D.   On: 07/22/2023 15:00   MR Shoulder Right Wo Contrast  Result Date: 07/05/2023 CLINICAL DATA:  Chronic right shoulder pain. Rotator cuff disorder suspected. Fall a couple of days ago. Caught self and heard a pop. Lateral pain. Cannot lift arm. EXAM: MRI OF THE RIGHT SHOULDER WITHOUT CONTRAST  TECHNIQUE: Multiplanar, multisequence MR imaging of the shoulder was performed. No intravenous contrast was administered. COMPARISON:  Right shoulder radiographs 07/03/2023 FINDINGS: Rotator cuff: There is mild-to-moderate attenuation of the articular fibers of the mid and anterior supraspinatus tendon footprint (coronal series 8 images 9 through 11). There is moderate intermediate T2 signal tendinosis of the anterior infraspinatus, with small linear interstitial tears (coronal images 5 through 7, sagittal series 9, image 7). Mild superior subscapularis insertional tendinosis. The teres minor is intact. Muscles:  Minimal supraspinatus muscle atrophy. Biceps long head: The intra-articular long head of the biceps tendon is intact. Acromioclavicular Joint: There are mild-to-moderate degenerative changes of the acromioclavicular joint including joint space narrowing, subchondral marrow edema, and peripheral osteophytosis. Type II acromion. Mild-to-moderate fluid within the subacromial/subdeltoid bursa, especially along the lateral aspect of the humeral head in the region of the acute fracture described below. Glenohumeral Joint: Mild-to-moderate thinning of the glenohumeral cartilage, greatest within the posterior glenoid and inferior medial humeral head. Labrum: There is mild degenerative irregularity of the anterior inferior glenoid labrum (axial images 12 through 15). Trace linear increased T2 signal indicating a tiny likely degenerative tear within the posteroinferior glenoid labrum (axial series 5, image 13). Bones: Note is made on recent 07/03/2023 radiographs of a curvilinear lucency of an acute fracture within the superolateral humeral head at the greater tuberosity with up to 4 mm lateral cortical step-off. On the current MRI there are multiple curvilinear decreased T1 increased T2 signal comminuted fracture lines within this region of the superficial greater tuberosity with mild superolateral humeral head 4 mm  cortical step-off and similar approximately 4 mm mild diastasis of the fracture components (coronal images 6 through 10, sagittal images 6 through 9). Other: None. IMPRESSION: 1. Acute comminuted fracture of the superficial humeral head greater tuberosity with mild superolateral humeral head cortical step-off and mild diastasis of the fracture components. 2. Mild-to-moderate partial-thickness tears of the articular sided fibers of the mid and anterior supraspinatus tendon footprint. 3. Moderate anterior infraspinatus tendinosis with small linear interstitial tears. No tendon retraction. 4. Mild-to-moderate degenerative changes of the acromioclavicular joint. 5. Mild-to-moderate glenohumeral cartilage degenerative changes. 6. Mild degenerative irregularity of the anterior inferior glenoid labrum. Trace linear increased T2 signal indicating a tiny likely degenerative tear within the  posteroinferior glenoid labrum. Electronically Signed   By: Neita Garnet M.D.   On: 07/05/2023 13:51

## 2023-08-03 ENCOUNTER — Ambulatory Visit (HOSPITAL_BASED_OUTPATIENT_CLINIC_OR_DEPARTMENT_OTHER): Payer: PPO

## 2023-08-03 ENCOUNTER — Ambulatory Visit (HOSPITAL_BASED_OUTPATIENT_CLINIC_OR_DEPARTMENT_OTHER): Payer: PPO | Admitting: Student

## 2023-08-03 ENCOUNTER — Encounter (HOSPITAL_BASED_OUTPATIENT_CLINIC_OR_DEPARTMENT_OTHER): Payer: Self-pay | Admitting: Student

## 2023-08-03 DIAGNOSIS — M25711 Osteophyte, right shoulder: Secondary | ICD-10-CM | POA: Diagnosis not present

## 2023-08-03 DIAGNOSIS — S42251A Displaced fracture of greater tuberosity of right humerus, initial encounter for closed fracture: Secondary | ICD-10-CM

## 2023-08-03 DIAGNOSIS — S42291D Other displaced fracture of upper end of right humerus, subsequent encounter for fracture with routine healing: Secondary | ICD-10-CM | POA: Diagnosis not present

## 2023-08-03 NOTE — Progress Notes (Signed)
Chief Complaint: Right shoulder pain     History of Present Illness:   08/03/2023: Patient is here today for follow-up of a right greater tuberosity fracture sustained 4 weeks ago.  He states that overall he is doing much better.  Over the last 2 weeks he feels as though he has gotten a little bit better each day.  Still does have some mild pain.  He does feel still weakness in the shoulder although he reports he was able to take something out of the microwave today with his right arm.  He is using ibuprofen and lidocaine gel as needed.   07/03/2023: Nicholas Maxwell is a 77 y.o. male presents today for evaluation of right shoulder pain.  [place] earlier this afternoon on a moving sidewalk and had his foot catch, causing him to fall forward onto his right shoulder.  He was also wearing a backpack.  States that he felt a pop in the outside of his shoulder.  Since the injury, he has been unable to reach overhead.  He has also had difficulty holding the steering wheel and taking off his jacket.  He is right-hand dominant.  Denies any history of shoulder injuries or issues.  Did report some brief numbness and tingling in his right thumb and index finger, however this has resolved.   Surgical History:   None  PMH/PSH/Family History/Social History/Meds/Allergies:    Past Medical History:  Diagnosis Date   Allergy 12/1998   Seasonal spring and fall. Crestor and statins.   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   Essential hypertension 05/12/2015   History of kidney stones    Myocardial infarction [facility]) 04/2017   Prostate cancer (HCC) 05/2020   Statin intolerance    Used >2 different statins   Type 2 diabetes mellitus without complication, without long-term current use of insulin (HCC) 05/12/2015   Past Surgical History:  Procedure Laterality Date   COLONOSCOPY     March of 2018    CORONARY  ANGIOPLASTY WITH STENT PLACEMENT  05/24/2017   CORONARY PRESSURE/FFR STUDY N/A 05/24/2017   Procedure: INTRAVASCULAR PRESSURE WIRE/FFR STUDY;  Surgeon: Elder Negus, MD;  Location: [facility] INVASIVE [facility];  Service: Cardiovascular;  Laterality: N/A;  MID LAD   CORONARY STENT INTERVENTION N/A 05/24/2017   Procedure: CORONARY STENT INTERVENTION;  Surgeon: Elder Negus, MD;  Location: [facility] INVASIVE CV LAB;  Service: Cardiovascular;  Laterality: N/A;  MID Circumflex 4.0x22 Onyx MID LAD 2.75x15 Onyx   CYSTOSCOPY WITH URETEROSCOPY, STONE BASKETRY AND STENT PLACEMENT  ~ 1990   HERNIA REPAIR     JOINT REPLACEMENT  10/2019   Right knee   KNEE ARTHROSCOPY Right 2016   LEFT HEART CATH AND CORONARY ANGIOGRAPHY N/A 05/24/2017   Procedure: LEFT HEART CATH AND CORONARY ANGIOGRAPHY;  Surgeon: Elder Negus, MD;  Location: [facility] CV [facility];  Service: Cardiovascular;  Laterality: N/A;   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   Procedure: TOTAL KNEE ARTHROPLASTY;  Surgeon: Durene Romans, MD;  Location: [facility];  Service: Orthopedics;  Laterality: Right;  70 mins   UMBILICAL HERNIA REPAIR  1992   UPPER GASTROINTESTINAL ENDOSCOPY     Social History   Socioeconomic History   Marital status: Married    Spouse name:  Not on file   Number of children: 2   Years of education: Not on file   Highest education level: Not on file  Occupational History   Occupation: retired    Comment: retired  Tobacco Use   Smoking status: Former    Current packs/day: 0.00    Average packs/day: 1 pack/day for 30.0 years (30.0 ttl pk-yrs)    Types: Cigarettes    Start date: 10/03/1971    Quit date: 10/02/2001    Years since quitting: 21.8   Smokeless tobacco: Never  Vaping Use   Vaping status: Never Used  Substance and Sexual Activity   Alcohol use: Not Currently    Comment: has not drank since 2014   Drug use: No   Sexual activity: Not Currently  Other Topics Concern   Not on file  Social History  Narrative   Married   Son and daughter   [name]   Worked in Optician, dispensing   Social Determinants of Health   Financial Resource Strain: Low Risk  (12/06/2022)   Overall Financial Resource Strain (CARDIA)    Difficulty of Paying Living Expenses: Not hard at all  Food Insecurity: No Food Insecurity (12/06/2022)   Hunger Vital Sign    Worried About Running Out of Food in the Last Year: Never true    Ran Out of Food in the Last Year: Never true  Transportation Needs: No Transportation Needs (12/06/2022)   PRAPARE - Administrator, Civil Service (Medical): No    Lack of Transportation (Non-Medical): No  Physical Activity: Sufficiently Active (12/06/2022)   Exercise Vital Sign    Days of Exercise per Week: 4 days    Minutes of Exercise per Session: 60 min  Stress: No Stress Concern Present (12/06/2022)   [employer] of Occupational Health - Occupational Stress Questionnaire    Feeling of Stress : Not at all  Social Connections: Socially Integrated (12/06/2022)   Social Connection and Isolation Panel [NHANES]    Frequency of Communication with Friends and Family: More than three times a week    Frequency of Social Gatherings with Friends and Family: More than three times a week    Attends Religious Services: More than 4 times per year    Active Member of [employer] or Organizations: Yes    Attends Engineer, structural: More than 4 times per year    Marital Status: Married   Family History  Problem Relation Age of Onset   Transient ischemic attack Father 26   Bone cancer Father    Cancer Father    Stroke Father    CAD Mother 32       CABG   Diabetes Mother    COPD Mother    Breast cancer Neg Hx    Colon cancer Neg Hx    Pancreatic cancer Neg Hx    Prostate cancer Neg Hx    Allergies  Allergen Reactions   Crestor [Rosuvastatin Calcium] Anaphylaxis and Swelling    Tongue swells/throat closes   Statins Other (See Comments)    Muscle/joint pain with ALL other  statin drug but Crestor is severe   Current Outpatient Medications  Medication Sig Dispense Refill   ALFUZOSIN HCL ER PO Take 10 mg by mouth daily in the afternoon. Prescribed by Dr. Annabell Howells     allopurinol (ZYLOPRIM) 300 MG tablet TAKE 1 TABLET BY MOUTH DAILY WITH LUNCH. 90 tablet 1   ASPIRIN 81 81 MG chewable tablet Chew 81 mg by mouth daily.  Blood Glucose Monitoring Suppl (ONE TOUCH ULTRA 2) w/Device KIT USE TO CHECK BLOOD SUGAR 1 kit 0   Evolocumab (REPATHA SURECLICK) 140 MG/ML SOAJ Inject 140 mg into the skin every 14 (fourteen) days. 6 mL 3   glucose blood (ONETOUCH ULTRA) test strip USE TO CHECK BLOOD SUGAR DAILY AND PRN 100 each 4   losartan (COZAAR) 25 MG tablet TAKE 1 TABLET (25 MG TOTAL) BY MOUTH DAILY. 90 tablet 1   metFORMIN (GLUCOPHAGE) 500 MG tablet Take 500 mg by mouth daily with breakfast.     metoprolol succinate (TOPROL-XL) 25 MG 24 hr tablet TAKE 1 TABLET BY MOUTH EVERY DAY 90 tablet 2   nitroGLYCERIN (NITROSTAT) 0.4 MG SL tablet Place 1 tablet (0.4 mg total) under the tongue every 5 (five) minutes x 3 doses as needed for chest pain. 30 tablet 2   ranolazine (RANEXA) 1000 MG SR tablet TAKE 1 TABLET BY MOUTH TWICE A DAY 180 tablet 1   scopolamine (TRANSDERM-SCOP) 1 MG/3DAYS Place 1 patch (1.5 mg total) onto the skin every 3 (three) days. 10 patch 12   No current facility-administered medications for this visit.   No results found.  Review of Systems:   A ROS was performed including pertinent positives and negatives as documented in the HPI.  Physical Exam :   Constitutional: NAD and appears stated age Neurological: Alert and oriented Psych: Appropriate affect and cooperative There were no vitals taken for this visit.   Comprehensive Musculoskeletal Exam:     Musculoskeletal Exam    Inspection Right Left  Skin No atrophy or winging No atrophy or winging  Palpation    Tenderness Lateral deltoid None  Range of Motion    Flexion (passive) 140 160  Flexion  (active) 140 160  Abduction 100 120  ER at the side 20 30  Can reach behind back to L5 L5  Strength     4/5 5/5  Special Tests    Pseudoparalytic No No  Neurologic    Fires PIN, radial, median, ulnar, musculocutaneous, axillary, suprascapular, long thoracic, and spinal accessory innervated muscles. No abnormal sensibility  Vascular/Lymphatic    Radial Pulse 2+ 2+  Cervical Exam    Patient has symmetric cervical range of motion with negative Spurling's test.  Special Test:     Imaging:   Xray (right shoulder 3 views): Healing greater tuberosity fracture with callus formation and no further displacement.   I personally reviewed and interpreted the radiographs.   Assessment:   77 y.o. male 4 weeks status post comminuted fracture of the right proximal humerus.  Overall he is progressing extremely well.  He has been able to tolerate gentle movement without the sling.  His range of motion is only lacking slightly with flexion and external rotation compared to his left shoulder.  Today's x-rays do show good progression of healing.  Given this, I have encouraged him to continue progressing with range of motion, particularly wall climbs to help with shoulder flexion.  I would like to see him back in 3 more weeks for reassessment, at which time we can hopefully discuss a strengthening program should his improvement continue.  Plan :    - Return to clinic in 3 weeks for follow up     I personally saw and evaluated the patient, and participated in the management and treatment plan.  Hazle Nordmann, PA-C Orthopedics

## 2023-08-29 ENCOUNTER — Ambulatory Visit (HOSPITAL_BASED_OUTPATIENT_CLINIC_OR_DEPARTMENT_OTHER): Payer: PPO

## 2023-08-29 ENCOUNTER — Ambulatory Visit (HOSPITAL_BASED_OUTPATIENT_CLINIC_OR_DEPARTMENT_OTHER): Payer: PPO | Admitting: Orthopaedic Surgery

## 2023-08-29 DIAGNOSIS — S42251A Displaced fracture of greater tuberosity of right humerus, initial encounter for closed fracture: Secondary | ICD-10-CM

## 2023-08-29 DIAGNOSIS — S42201D Unspecified fracture of upper end of right humerus, subsequent encounter for fracture with routine healing: Secondary | ICD-10-CM | POA: Diagnosis not present

## 2023-08-29 NOTE — Progress Notes (Signed)
Chief Complaint: Right shoulder pain        History of Present Illness:    08/03/2023: Presents today for follow-up of the right shoulder.  Overall he is doing much better and active range of motion is dramatically improved  07/03/2023: Nicholas Maxwell is a 77 y.o. male presents today for evaluation of right shoulder pain.  Patient [place] earlier this afternoon on a moving sidewalk and had his foot catch, causing him to fall forward onto his right shoulder.  He was also wearing a backpack.  States that he felt a pop in the outside of his shoulder.  Since the injury, he has been unable to reach overhead.  He has also had difficulty holding the steering wheel and taking off his jacket.  He is right-hand dominant.  Denies any history of shoulder injuries or issues.  Did report some brief numbness and tingling in his right thumb and index finger, however this has resolved.     Surgical History:   None   PMH/PSH/Family History/Social History/Meds/Allergies:         Past Medical History:  Diagnosis Date   Allergy 12/1998    Seasonal spring and fall. Crestor and statins.   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   Essential hypertension 05/12/2015   History of kidney stones     Myocardial infarction [facility]) 04/2017   Prostate cancer (HCC) 05/2020   Statin intolerance      Used >2 different statins   Type 2 diabetes mellitus without complication, without long-term current use of insulin (HCC) 05/12/2015             Past Surgical History:  Procedure Laterality Date   COLONOSCOPY        March of 2018    CORONARY ANGIOPLASTY WITH STENT PLACEMENT   05/24/2017   CORONARY PRESSURE/FFR STUDY N/A 05/24/2017    Procedure: INTRAVASCULAR PRESSURE WIRE/FFR STUDY;  Surgeon: Elder Negus, MD;  Location: [facility];  Service: Cardiovascular;  Laterality: N/A;  MID LAD   CORONARY STENT INTERVENTION N/A 05/24/2017     Procedure: CORONARY STENT INTERVENTION;  Surgeon: Elder Negus, MD;  Location: [facility];  Service: Cardiovascular;  Laterality: N/A;  MID Circumflex 4.0x22 Onyx MID LAD 2.75x15 Onyx   CYSTOSCOPY WITH URETEROSCOPY, STONE BASKETRY AND STENT PLACEMENT   ~ 1990   HERNIA REPAIR       JOINT REPLACEMENT   10/2019    Right knee   KNEE ARTHROSCOPY Right 2016   LEFT HEART CATH AND CORONARY ANGIOGRAPHY N/A 05/24/2017    Procedure: LEFT HEART CATH AND CORONARY ANGIOGRAPHY;  Surgeon: Elder Negus, MD;  Location: [facility];  Service: Cardiovascular;  Laterality: N/A;   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    Procedure: TOTAL KNEE ARTHROPLASTY;  Surgeon: Durene Romans, MD;  Location: [facility];  Service: Orthopedics;  Laterality: Right;  70 mins   UMBILICAL HERNIA REPAIR   1992   UPPER GASTROINTESTINAL ENDOSCOPY            Social History         Socioeconomic History   Marital status: Married      Spouse name: Not on file   Number of children: 2   Years of education: Not on file   Highest education level: Not on file  Occupational History   Occupation: retired  Comment: retired  Tobacco Use   Smoking status: Former      Current packs/day: 0.00      Average packs/day: 1 pack/day for 30.0 years (30.0 ttl pk-yrs)      Types: Cigarettes      Start date: 10/03/1971      Quit date: 10/02/2001      Years since quitting: 21.8   Smokeless tobacco: Never  Vaping Use   Vaping status: Never Used  Substance and Sexual Activity   Alcohol use: Not Currently      Comment: has not drank since 2014   Drug use: No   Sexual activity: Not Currently  Other Topics Concern   Not on file  Social History Narrative    Married    Son and daughter    [name]    Worked in Optician, dispensing    Social Determinants of Health        Financial Resource Strain: Low Risk  (12/06/2022)    Overall Financial Resource Strain (CARDIA)     Difficulty of Paying Living Expenses: Not  hard at all  Food Insecurity: No Food Insecurity (12/06/2022)    Hunger Vital Sign     Worried About Running Out of Food in the Last Year: Never true     Ran Out of Food in the Last Year: Never true  Transportation Needs: No Transportation Needs (12/06/2022)    PRAPARE - Therapist, art (Medical): No     Lack of Transportation (Non-Medical): No  Physical Activity: Sufficiently Active (12/06/2022)    Exercise Vital Sign     Days of Exercise per Week: 4 days     Minutes of Exercise per Session: 60 min  Stress: No Stress Concern Present (12/06/2022)    [employer] of Occupational Health - Occupational Stress Questionnaire     Feeling of Stress : Not at all  Social Connections: Socially Integrated (12/06/2022)    Social Connection and Isolation Panel [NHANES]     Frequency of Communication with Friends and Family: More than three times a week     Frequency of Social Gatherings with Friends and Family: More than three times a week     Attends Religious Services: More than 4 times per year     Active Member of [employer] or Organizations: Yes     Attends Engineer, structural: More than 4 times per year     Marital Status: Married         Family History  Problem Relation Age of Onset   Transient ischemic attack Father 32   Bone cancer Father     Cancer Father     Stroke Father     CAD Mother 72        CABG   Diabetes Mother     COPD Mother     Breast cancer Neg Hx     Colon cancer Neg Hx     Pancreatic cancer Neg Hx     Prostate cancer Neg Hx          Allergies       Allergies  Allergen Reactions   Crestor [Rosuvastatin Calcium] Anaphylaxis and Swelling      Tongue swells/throat closes   Statins Other (See Comments)      Muscle/joint pain with ALL other statin drug but Crestor is severe            Current Outpatient Medications  Medication Sig Dispense Refill   ALFUZOSIN HCL  ER PO Take 10 mg by mouth daily in the afternoon. Prescribed by Dr.  Annabell Howells       allopurinol (ZYLOPRIM) 300 MG tablet TAKE 1 TABLET BY MOUTH DAILY WITH LUNCH. 90 tablet 1   ASPIRIN 81 81 MG chewable tablet Chew 81 mg by mouth daily.       Blood Glucose Monitoring Suppl (ONE TOUCH ULTRA 2) w/Device KIT USE TO CHECK BLOOD SUGAR 1 kit 0   Evolocumab (REPATHA SURECLICK) 140 MG/ML SOAJ Inject 140 mg into the skin every 14 (fourteen) days. 6 mL 3   glucose blood (ONETOUCH ULTRA) test strip USE TO CHECK BLOOD SUGAR DAILY AND PRN 100 each 4   losartan (COZAAR) 25 MG tablet TAKE 1 TABLET (25 MG TOTAL) BY MOUTH DAILY. 90 tablet 1   metFORMIN (GLUCOPHAGE) 500 MG tablet Take 500 mg by mouth daily with breakfast.       metoprolol succinate (TOPROL-XL) 25 MG 24 hr tablet TAKE 1 TABLET BY MOUTH EVERY DAY 90 tablet 2   nitroGLYCERIN (NITROSTAT) 0.4 MG SL tablet Place 1 tablet (0.4 mg total) under the tongue every 5 (five) minutes x 3 doses as needed for chest pain. 30 tablet 2   ranolazine (RANEXA) 1000 MG SR tablet TAKE 1 TABLET BY MOUTH TWICE A DAY 180 tablet 1   scopolamine (TRANSDERM-SCOP) 1 MG/3DAYS Place 1 patch (1.5 mg total) onto the skin every 3 (three) days. 10 patch 12      No current facility-administered medications for this visit.      Imaging Results (Last 48 hours)  No results found.     Review of Systems:   A ROS was performed including pertinent positives and negatives as documented in the HPI.   Physical Exam :   Constitutional: NAD and appears stated age Neurological: Alert and oriented Psych: Appropriate affect and cooperative There were no vitals taken for this visit.    Comprehensive Musculoskeletal Exam:       Musculoskeletal Exam      Inspection Right Left  Skin No atrophy or winging No atrophy or winging  Palpation      Tenderness Lateral deltoid None  Range of Motion      Flexion (passive) 140 160  Flexion (active) 140 160  Abduction 100 120  ER at the side 20 30  Can reach behind back to L5 L5  Strength        4/5 5/5  Special  Tests      Pseudoparalytic No No  Neurologic      Fires PIN, radial, median, ulnar, musculocutaneous, axillary, suprascapular, long thoracic, and spinal accessory innervated muscles. No abnormal sensibility  Vascular/Lymphatic      Radial Pulse 2+ 2+  Cervical Exam      Patient has symmetric cervical range of motion with negative Spurling's test.  Special Test:        Imaging:   Xray (right shoulder 3 views): Healing greater tuberosity fracture with callus formation and no further displacement.     I personally reviewed and interpreted the radiographs.     Assessment:   77 y.o. male presents today 2 months status post right proximal humerus fracture treated conservatively.  Overall he is doing much better.  Active range of motion is dramatically improved and nearly equal to the contralateral side.  He will return to clinic as needed Plan :     - Return to clinic as needed         I personally saw and  evaluated the patient, and participated in the management and treatment plan.

## 2023-09-30 ENCOUNTER — Other Ambulatory Visit: Payer: Self-pay | Admitting: Cardiology

## 2023-10-09 ENCOUNTER — Other Ambulatory Visit: Payer: Self-pay | Admitting: *Deleted

## 2023-10-09 DIAGNOSIS — D539 Nutritional anemia, unspecified: Secondary | ICD-10-CM

## 2023-10-10 ENCOUNTER — Inpatient Hospital Stay: Payer: PPO

## 2023-10-10 DIAGNOSIS — I129 Hypertensive chronic kidney disease with stage 1 through stage 4 chronic kidney disease, or unspecified chronic kidney disease: Secondary | ICD-10-CM | POA: Diagnosis not present

## 2023-10-10 DIAGNOSIS — Z79899 Other long term (current) drug therapy: Secondary | ICD-10-CM | POA: Diagnosis not present

## 2023-10-10 DIAGNOSIS — N1832 Chronic kidney disease, stage 3b: Secondary | ICD-10-CM | POA: Insufficient documentation

## 2023-10-10 DIAGNOSIS — C61 Malignant neoplasm of prostate: Secondary | ICD-10-CM | POA: Insufficient documentation

## 2023-10-10 DIAGNOSIS — D539 Nutritional anemia, unspecified: Secondary | ICD-10-CM

## 2023-10-10 DIAGNOSIS — E1122 Type 2 diabetes mellitus with diabetic chronic kidney disease: Secondary | ICD-10-CM | POA: Insufficient documentation

## 2023-10-10 DIAGNOSIS — D509 Iron deficiency anemia, unspecified: Secondary | ICD-10-CM | POA: Diagnosis not present

## 2023-10-10 DIAGNOSIS — Z923 Personal history of irradiation: Secondary | ICD-10-CM | POA: Diagnosis not present

## 2023-10-10 DIAGNOSIS — Z888 Allergy status to other drugs, medicaments and biological substances status: Secondary | ICD-10-CM | POA: Insufficient documentation

## 2023-10-10 LAB — CBC WITH DIFFERENTIAL (CANCER CENTER ONLY)
Abs Immature Granulocytes: 0.12 10*3/uL — ABNORMAL HIGH (ref 0.00–0.07)
Basophils Absolute: 0 10*3/uL (ref 0.0–0.1)
Basophils Relative: 1 %
Eosinophils Absolute: 0.1 10*3/uL (ref 0.0–0.5)
Eosinophils Relative: 1 %
HCT: 36.2 % — ABNORMAL LOW (ref 39.0–52.0)
Hemoglobin: 12.3 g/dL — ABNORMAL LOW (ref 13.0–17.0)
Immature Granulocytes: 2 %
Lymphocytes Relative: 18 %
Lymphs Abs: 1.1 10*3/uL (ref 0.7–4.0)
MCH: 33.9 pg (ref 26.0–34.0)
MCHC: 34 g/dL (ref 30.0–36.0)
MCV: 99.7 fL (ref 80.0–100.0)
Monocytes Absolute: 0.4 10*3/uL (ref 0.1–1.0)
Monocytes Relative: 7 %
Neutro Abs: 4.6 10*3/uL (ref 1.7–7.7)
Neutrophils Relative %: 71 %
Platelet Count: 178 10*3/uL (ref 150–400)
RBC: 3.63 MIL/uL — ABNORMAL LOW (ref 4.22–5.81)
RDW: 12.2 % (ref 11.5–15.5)
WBC Count: 6.3 10*3/uL (ref 4.0–10.5)
nRBC: 0 % (ref 0.0–0.2)

## 2023-10-10 LAB — CMP (CANCER CENTER ONLY)
ALT: 14 U/L (ref 0–44)
AST: 14 U/L — ABNORMAL LOW (ref 15–41)
Albumin: 4.4 g/dL (ref 3.5–5.0)
Alkaline Phosphatase: 63 U/L (ref 38–126)
Anion gap: 6 (ref 5–15)
BUN: 28 mg/dL — ABNORMAL HIGH (ref 8–23)
CO2: 28 mmol/L (ref 22–32)
Calcium: 9.8 mg/dL (ref 8.9–10.3)
Chloride: 105 mmol/L (ref 98–111)
Creatinine: 1.57 mg/dL — ABNORMAL HIGH (ref 0.61–1.24)
GFR, Estimated: 45 mL/min — ABNORMAL LOW (ref >60)
Glucose, Bld: 146 mg/dL — ABNORMAL HIGH (ref 70–99)
Potassium: 4.6 mmol/L (ref 3.5–5.1)
Sodium: 139 mmol/L (ref 135–145)
Total Bilirubin: 0.6 mg/dL (ref 0.0–1.2)
Total Protein: 7.4 g/dL (ref 6.5–8.1)

## 2023-10-10 LAB — T4, FREE: Free T4: 0.74 ng/dL (ref 0.61–1.12)

## 2023-10-10 LAB — LACTATE DEHYDROGENASE: LDH: 137 U/L (ref 98–192)

## 2023-10-10 LAB — TSH: TSH: 2.098 u[IU]/mL (ref 0.350–4.500)

## 2023-10-11 LAB — KAPPA/LAMBDA LIGHT CHAINS
Kappa free light chain: 26.1 mg/L — ABNORMAL HIGH (ref 3.3–19.4)
Kappa, lambda light chain ratio: 1.53 (ref 0.26–1.65)
Lambda free light chains: 17.1 mg/L (ref 5.7–26.3)

## 2023-10-16 NOTE — Progress Notes (Signed)
Patient Care Team: Jarold Motto, [place] as PCP - General (Physician Assistant) Bjorn Pippin, MD as Attending Physician (Urology) Rutherford Guys., MD as Referring Physician (Gastroenterology) Elder Negus, MD as Consulting Physician (Cardiology) Alric Quan, [facility] (Gastroenterology) Durene Romans, MD as Consulting Physician (Orthopedic Surgery) Erroll Luna, [facility] (Inactive) (Pharmacist) [facility] as Consulting Physician (Optometry)  Clinic Day:  10/18/2023  Referring physician: Jarold Motto, PA  ASSESSMENT & PLAN:   Assessment & Plan: 78 y.o. 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   Laboratory studies show microcytic anemia. Normal LFT, B12. He has CKD. No clear findings so far. PSA 0.15 in 05/2023.   Mr. Finfrock has no concerning symptoms, overall he is feeling well.  We went over the results today.  Slight abnormality in light chains but no monoclonal protein identified.  Stable hemoglobin repeat labs show stable free light chain level.  No other concerning findings.  Continue follow-up surveillance for history of prostate cancer. Follow-up with PCP routinely Follow up with me in mid-May Labs 2 weeks before visit.  The patient understands the plans discussed today and is in agreement with them.  He knows to contact our office if he develops concerns prior to his next appointment.  Melven Sartorius, MD  [facility] [address] [place] [address] Dept: [phone] Dept Fax: [phone]   Orders Placed This Encounter  Procedures   CBC with Differential (Cancer Center Only)    Standing Status:   Future    Expiration Date:   10/17/2024   CMP (Cancer Center only)     Standing Status:   Future    Expiration Date:   10/17/2024   Serum protein electrophoresis with reflex    Standing Status:   Future    Expiration Date:   10/17/2024   IgG, IgA, IgM    Standing Status:   Future    Expiration Date:   10/17/2024   Kappa/lambda light chains    Standing Status:   Future    Expiration Date:   10/17/2024    CHIEF COMPLAINT:  CC: anemia  INTERVAL HISTORY:  Overall feeling well. No night sweats, weight loss or decreased appetite. No new back pain, stomach pain, coughing, bleeding, bloody stool. He denies any changes.  Next follow up for prostate cancer will be in Feb with PSA with Dr. Pethtel Singer.  I have reviewed the past medical history, past surgical history, social history and family history with the patient and they are unchanged from previous note.  Previous history  LUTHUR LINDMARK 78 y.o. male was initially seen by me because of anemia in Oct 2024. Labs showed macrocytosis. Hemoglobin has been slightly. He reports just feels tired. He was more tired during ADT for prostate cancer and needed a nap then but not anymore. 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    No night sweats, unexpected weight loss, decreased appetite, mass or lump, abdominal distention, unexpected bleeding or bruising. Wife reports his stomach is bigger. No stomach pain, nausea, vomiting, bloody or dark stool. Colonoscopy is up to date.   He eats fruits and vegetable everyday, not a vegetarian. He does not take NSAID. He takes aspirin 81 mg once daily.   Laboratory studies  showed normal folate, ferritin.  Borderline macrocytic anemia.  Hemoglobin improved compared to previously.  LFT was normal.  Mild renal insufficiency.  Kappa light chain slightly above normal.  No monoclonal protein identified.  The immunofixation pattern appears unremarkable. Evidence of monoclonal protein is not apparent.    ALLERGIES:  is allergic to crestor [rosuvastatin calcium] and  statins.  MEDICATIONS:  Current Outpatient Medications  Medication Sig Dispense Refill   ALFUZOSIN HCL ER PO Take 10 mg by mouth daily in the afternoon. Prescribed by Dr. Annabell Howells     allopurinol (ZYLOPRIM) 300 MG tablet TAKE 1 TABLET BY MOUTH DAILY WITH LUNCH. 90 tablet 1   ASPIRIN 81 81 MG chewable tablet Chew 81 mg by mouth daily.     Blood Glucose Monitoring Suppl (ONE TOUCH ULTRA 2) w/Device KIT USE TO CHECK BLOOD SUGAR 1 kit 0   Evolocumab (REPATHA SURECLICK) 140 MG/ML SOAJ Inject 140 mg into the skin every 14 (fourteen) days. 6 mL 3   glucose blood (ONETOUCH ULTRA) test strip USE TO CHECK BLOOD SUGAR DAILY AND PRN 100 each 4   losartan (COZAAR) 25 MG tablet TAKE 1 TABLET (25 MG TOTAL) BY MOUTH DAILY. 90 tablet 1   metoprolol succinate (TOPROL-XL) 25 MG 24 hr tablet TAKE 1 TABLET BY MOUTH EVERY DAY 90 tablet 2   nitroGLYCERIN (NITROSTAT) 0.4 MG SL tablet Place 1 tablet (0.4 mg total) under the tongue every 5 (five) minutes x 3 doses as needed for chest pain. 30 tablet 2   ranolazine (RANEXA) 1000 MG SR tablet TAKE 1 TABLET BY MOUTH TWICE A DAY 180 tablet 2   scopolamine (TRANSDERM-SCOP) 1 MG/3DAYS Place 1 patch (1.5 mg total) onto the skin every 3 (three) days. 10 patch 12   No current facility-administered medications for this visit.    HISTORY OF PRESENT ILLNESS:   Oncology History  Malignant neoplasm of prostate (HCC)  05/05/2020 Cancer Staging   Staging form: Prostate, AJCC 8th Edition - Clinical stage from 05/05/2020: Stage IIC (cT2a, cN0, cM0, PSA: 2.6, Grade Group: 3) - Signed by Marcello Fennel, PA-C on 06/29/2020   06/11/2020 Initial Diagnosis   Malignant neoplasm of prostate (HCC)       REVIEW OF SYSTEMS:   All relevant systems were reviewed with the patient and are negative.   VITALS:  Blood pressure 135/65, pulse 60, temperature 97.8 F (36.6 C), resp. rate 16, weight 207 lb 1.6 oz (93.9 kg), SpO2 100%.  Wt Readings from Last 3 Encounters:  10/18/23 207 lb 1.6 oz  (93.9 kg)  07/19/23 202 lb 12.8 oz (92 kg)  05/24/23 203 lb (92.1 kg)    Body mass index is 30.58 kg/m.  Performance status (ECOG): 0 - Asymptomatic  PHYSICAL EXAM:   NA  LABORATORY DATA:  I have reviewed the data as listed    Component Value Date/Time   NA 139 10/10/2023 0921   NA 135 (A) 10/27/2022 0000   K 4.6 10/10/2023 0921   CL 105 10/10/2023 0921   CO2 28 10/10/2023 0921   GLUCOSE 146 (H) 10/10/2023 0921   BUN 28 (H) 10/10/2023 0921   BUN 24 (A) 10/27/2022 0000   CREATININE 1.57 (H) 10/10/2023 0921   CREATININE 1.41 (H) 09/07/2020 1342   CALCIUM 9.8 10/10/2023 0921   PROT 7.4 10/10/2023 0921   ALBUMIN 4.4 10/10/2023 0921   AST 14 (L) 10/10/2023 0921   ALT 14 10/10/2023 0921   ALKPHOS 63 10/10/2023 0921   BILITOT 0.6 10/10/2023 4010  GFRNONAA 45 (L) 10/10/2023 0921   GFRNONAA 49 08/21/2018 0000   GFRAA >60 10/21/2019 1156    No results found for: "SPEP", "UPEP"  Lab Results  Component Value Date   WBC 6.3 10/10/2023   NEUTROABS 4.6 10/10/2023   HGB 12.3 (L) 10/10/2023   HCT 36.2 (L) 10/10/2023   MCV 99.7 10/10/2023   PLT 178 10/10/2023      Chemistry      Component Value Date/Time   NA 139 10/10/2023 0921   NA 135 (A) 10/27/2022 0000   K 4.6 10/10/2023 0921   CL 105 10/10/2023 0921   CO2 28 10/10/2023 0921   BUN 28 (H) 10/10/2023 0921   BUN 24 (A) 10/27/2022 0000   CREATININE 1.57 (H) 10/10/2023 0921   CREATININE 1.41 (H) 09/07/2020 1342   GLU 147 10/27/2022 0000      Component Value Date/Time   CALCIUM 9.8 10/10/2023 0921   ALKPHOS 63 10/10/2023 0921   AST 14 (L) 10/10/2023 0921   ALT 14 10/10/2023 0921   BILITOT 0.6 10/10/2023 0921       RADIOGRAPHIC STUDIES: I have personally reviewed the radiological images as listed and agreed with the findings in the report. No results found.

## 2023-10-17 ENCOUNTER — Other Ambulatory Visit: Payer: PPO

## 2023-10-17 LAB — MULTIPLE MYELOMA PANEL, SERUM
Albumin SerPl Elph-Mcnc: 4.2 g/dL (ref 2.9–4.4)
Albumin/Glob SerPl: 1.6 (ref 0.7–1.7)
Alpha 1: 0.2 g/dL (ref 0.0–0.4)
Alpha2 Glob SerPl Elph-Mcnc: 0.7 g/dL (ref 0.4–1.0)
B-Globulin SerPl Elph-Mcnc: 0.9 g/dL (ref 0.7–1.3)
Gamma Glob SerPl Elph-Mcnc: 0.9 g/dL (ref 0.4–1.8)
Globulin, Total: 2.7 g/dL (ref 2.2–3.9)
IgA: 194 mg/dL (ref 61–437)
IgG (Immunoglobin G), Serum: 1100 mg/dL (ref 603–1613)
IgM (Immunoglobulin M), Srm: 60 mg/dL (ref 15–143)
Total Protein ELP: 6.9 g/dL (ref 6.0–8.5)

## 2023-10-18 ENCOUNTER — Inpatient Hospital Stay: Payer: PPO

## 2023-10-18 VITALS — BP 135/65 | HR 60 | Temp 97.8°F | Resp 16 | Wt 207.1 lb

## 2023-10-18 DIAGNOSIS — R768 Other specified abnormal immunological findings in serum: Secondary | ICD-10-CM

## 2023-10-18 DIAGNOSIS — I10 Essential (primary) hypertension: Secondary | ICD-10-CM | POA: Diagnosis not present

## 2023-10-18 DIAGNOSIS — C61 Malignant neoplasm of prostate: Secondary | ICD-10-CM | POA: Diagnosis not present

## 2023-10-21 ENCOUNTER — Other Ambulatory Visit: Payer: Self-pay | Admitting: Cardiology

## 2023-10-21 DIAGNOSIS — E119 Type 2 diabetes mellitus without complications: Secondary | ICD-10-CM

## 2023-11-02 ENCOUNTER — Telehealth: Payer: Self-pay

## 2023-11-02 ENCOUNTER — Other Ambulatory Visit (HOSPITAL_COMMUNITY): Payer: Self-pay

## 2023-11-02 NOTE — Telephone Encounter (Signed)
Pharmacy Patient Advocate Encounter   Received notification from CoverMyMeds that prior authorization for REPATHA is required/requested.   Insurance verification completed.   The patient is insured through [facility] ADVANTAGE/RX ADVANCE .   Per test claim: Refill too soon. PA is not needed at this time. Medication was filled 10/26/23. Next eligible fill date is 12/28/23.

## 2023-11-26 ENCOUNTER — Ambulatory Visit (INDEPENDENT_AMBULATORY_CARE_PROVIDER_SITE_OTHER): Payer: PPO | Admitting: Physician Assistant

## 2023-11-26 ENCOUNTER — Encounter: Payer: Self-pay | Admitting: Physician Assistant

## 2023-11-26 VITALS — BP 118/60 | HR 55 | Temp 97.3°F | Ht 69.0 in | Wt 202.0 lb

## 2023-11-26 DIAGNOSIS — T7840XA Allergy, unspecified, initial encounter: Secondary | ICD-10-CM

## 2023-11-26 DIAGNOSIS — N1832 Chronic kidney disease, stage 3b: Secondary | ICD-10-CM

## 2023-11-26 DIAGNOSIS — D539 Nutritional anemia, unspecified: Secondary | ICD-10-CM

## 2023-11-26 DIAGNOSIS — E119 Type 2 diabetes mellitus without complications: Secondary | ICD-10-CM

## 2023-11-26 DIAGNOSIS — C61 Malignant neoplasm of prostate: Secondary | ICD-10-CM | POA: Diagnosis not present

## 2023-11-26 LAB — PSA: PSA: 0.17

## 2023-11-26 LAB — POCT GLYCOSYLATED HEMOGLOBIN (HGB A1C): Hemoglobin A1C: 6.5 % — AB (ref 4.0–5.6)

## 2023-11-26 NOTE — Patient Instructions (Signed)
 It was great to see you!  Consider the SGLT-2 medications for your blood sugars and kidney health  Try to keep up the walking regularly to help with your blood sugar  Let's follow-up in 6 month(s), sooner if you have concerns.  Take care,  Jarold Motto PA-C

## 2023-11-26 NOTE — Progress Notes (Signed)
 Nicholas Maxwell is a 78 y.o. male here for a follow up of a pre-existing problem.  History of Present Illness:   Chief Complaint  Patient presents with   Diabetes   Diabetes  Currently not on any medications.  A1c is 6.5 today compared to 6.3 previously.  He denies typically checking his blood sugar levels at home. Minimal exercise due to cold weather at this time.  Denies hypoglycemic or hyperglycemic episodes or symptoms.    Macrocytic Anemia Today he states that blood work with hematology indicated a low red blood cell count.  He is getting regular blood work with them for ongoing maintenance. Continues to be managed by hematology. No further concerns.    Allergies  He complains today of a allergy symptoms including red eyes and congestion.  He has been taking an allergy relief with some relief and a nasal spray at night.  Currently, his symptoms are managed, he denies any further concerns at this time.  Denies fevers, chills.  Past Medical History:  Diagnosis Date   Allergy 12/1998   Seasonal spring and fall. Crestor and statins.   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) 04/2017   Prostate cancer (HCC) 05/2020   Statin intolerance    Used >2 different statins   Type 2 diabetes mellitus without complication, without long-term current use of insulin (HCC) 05/12/2015     Social History   Tobacco Use   Smoking status: Former    Current packs/day: 0.00    Average packs/day: 1 pack/day for 30.0 years (30.0 ttl pk-yrs)    Types: Cigarettes    Start date: 10/03/1971    Quit date: 10/02/2001    Years since quitting: 22.1   Smokeless tobacco: Never  Vaping Use   Vaping status: Never Used  Substance Use Topics   Alcohol use: Not Currently    Comment: has not drank since 2014   Drug use: No    Past Surgical History:  Procedure Laterality Date    COLONOSCOPY     March of 2018    CORONARY ANGIOPLASTY WITH STENT PLACEMENT  05/24/2017   CORONARY PRESSURE/FFR STUDY N/A 05/24/2017   Procedure: INTRAVASCULAR PRESSURE WIRE/FFR STUDY;  Surgeon: Elder Negus, MD;  [facility];  Service: Cardiovascular;  Laterality: N/A;  MID LAD   CORONARY STENT INTERVENTION N/A 05/24/2017   Procedure: CORONARY STENT INTERVENTION;  Surgeon: Elder Negus, MD;  [facility];  Service: Cardiovascular;  Laterality: N/A;  MID Circumflex 4.0x22 Onyx MID LAD 2.75x15 Onyx   CYSTOSCOPY WITH URETEROSCOPY, STONE BASKETRY AND STENT PLACEMENT  ~ 1990   HERNIA REPAIR     JOINT REPLACEMENT  10/2019   Right knee   KNEE ARTHROSCOPY Right 2016   LEFT HEART CATH AND CORONARY ANGIOGRAPHY N/A 05/24/2017   Procedure: LEFT HEART CATH AND CORONARY ANGIOGRAPHY;  Surgeon: Elder Negus, MD;  [facility];  Service: Cardiovascular;  Laterality: N/A;   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   Procedure: TOTAL KNEE ARTHROPLASTY;  Surgeon: Durene Romans, MD;  Location: [facility];  Service: Orthopedics;  Laterality: Right;  70 mins   UMBILICAL HERNIA REPAIR  1992   UPPER GASTROINTESTINAL ENDOSCOPY      Family History  Problem Relation Age of Onset   Transient ischemic attack Father 75   Bone cancer Father  Cancer Father    Stroke Father    CAD Mother 21       CABG   Diabetes Mother    COPD Mother    Breast cancer Neg Hx    Colon cancer Neg Hx    Pancreatic cancer Neg Hx    Prostate cancer Neg Hx     Allergies  Allergen Reactions   Crestor [Rosuvastatin Calcium] Anaphylaxis and Swelling    Tongue swells/throat closes   Statins Other (See Comments)    Muscle/joint pain with ALL other statin drug but Crestor is severe    Current Medications:   Current Outpatient Medications:    ALFUZOSIN HCL ER PO, Take 10 mg by mouth daily in the afternoon. Prescribed by Dr. Annabell Howells, Disp: , Rfl:    allopurinol  (ZYLOPRIM) 300 MG tablet, TAKE 1 TABLET BY MOUTH DAILY WITH LUNCH., Disp: 90 tablet, Rfl: 1   ASPIRIN 81 81 MG chewable tablet, Chew 81 mg by mouth daily., Disp: , Rfl:    Blood Glucose Monitoring Suppl (ONE TOUCH ULTRA 2) w/Device KIT, USE TO CHECK BLOOD SUGAR, Disp: 1 kit, Rfl: 0   Evolocumab (REPATHA SURECLICK) 140 MG/ML SOAJ, Inject 140 mg into the skin every 14 (fourteen) days., Disp: 6 mL, Rfl: 3   glucose blood (ONETOUCH ULTRA) test strip, USE TO CHECK BLOOD SUGAR DAILY AND PRN, Disp: 100 each, Rfl: 4   losartan (COZAAR) 25 MG tablet, Take 1 tablet (25 mg total) by mouth daily., Disp: 90 tablet, Rfl: 2   metoprolol succinate (TOPROL-XL) 25 MG 24 hr tablet, TAKE 1 TABLET BY MOUTH EVERY DAY, Disp: 90 tablet, Rfl: 2   nitroGLYCERIN (NITROSTAT) 0.4 MG SL tablet, Place 1 tablet (0.4 mg total) under the tongue every 5 (five) minutes x 3 doses as needed for chest pain., Disp: 30 tablet, Rfl: 2   ranolazine (RANEXA) 1000 MG SR tablet, TAKE 1 TABLET BY MOUTH TWICE A DAY, Disp: 180 tablet, Rfl: 2   Review of Systems:   Review of Systems  HENT:  Positive for congestion.        Red eye    Vitals:   Vitals:   11/26/23 0907  BP: 118/60  Pulse: (!) 55  Temp: (!) 97.3 F (36.3 C)  TempSrc: Temporal  SpO2: 99%  Weight: 202 lb (91.6 kg)  Height: 5\' 9"  (1.753 m)     Body mass index is 29.83 kg/m.  Physical Exam:   Physical Exam Vitals and nursing note reviewed.  Constitutional:      General: He is not in acute distress.    Appearance: He is well-developed. He is not ill-appearing or toxic-appearing.  Eyes:     Conjunctiva/sclera:     Right eye: Right conjunctiva is injected.     Left eye: Left conjunctiva is injected.  Cardiovascular:     Rate and Rhythm: Normal rate and regular rhythm.     Pulses: Normal pulses.     Heart sounds: Normal heart sounds, S1 normal and S2 normal.  Pulmonary:     Effort: Pulmonary effort is normal.     Breath sounds: Normal breath sounds.  Skin:     General: Skin is warm and dry.  Neurological:     Mental Status: He is alert.     GCS: GCS eye subscore is 4. GCS verbal subscore is 5. GCS motor subscore is 6.  Psychiatric:        Speech: Speech normal.        Behavior: Behavior normal.  Behavior is cooperative.    Results for orders placed or performed in visit on 11/26/23  POCT glycosylated hemoglobin (Hb A1C)  Result Value Ref Range   Hemoglobin A1C 6.5 (A) 4.0 - 5.6 %     Assessment and Plan:   Type 2 diabetes mellitus without complication, without long-term current use of insulin (HCC); Chronic kidney disease, stage 3b (HCC) Most recent CMP reviewed A1c is overall the same I do think he could benefit from an SGLT-2 inhibitor however he is concerned about cost -- reviewed risks and benefits/side effect(s) Provided information to him to research and he will let us know if he would like to pursue one of these Follow-up in 6 month(s), sooner if concerns  Macrocytic anemia Most recent hematology note reviewed Continue to monitor  Allergy, initial encounter Symptom(s) improving Continue OTC (available over the counter without a prescription) medications for symptom(s) as needed If new/worsening/lack of improvement -- recommend reaching out, will likely add antibiotic(s) for sinusitis  Jarold Motto, PA-C  I,Safa M Kadhim,acting as a scribe for Jarold Motto, PA.,have documented all relevant documentation on the behalf of Jarold Motto, PA,as directed by  Jarold Motto, PA while in the presence of Jarold Motto, [place].   I, Jarold Motto, [place], have reviewed all documentation for this visit. The documentation on 11/26/23 for the exam, diagnosis, procedures, and orders are all accurate and complete.

## 2023-11-27 ENCOUNTER — Encounter: Payer: Self-pay | Admitting: Physician Assistant

## 2023-11-29 ENCOUNTER — Encounter: Payer: PPO | Admitting: Pharmacist

## 2023-11-30 ENCOUNTER — Other Ambulatory Visit: Payer: Self-pay | Admitting: Cardiology

## 2023-11-30 DIAGNOSIS — I2510 Atherosclerotic heart disease of native coronary artery without angina pectoris: Secondary | ICD-10-CM

## 2023-12-03 ENCOUNTER — Other Ambulatory Visit: Payer: Self-pay | Admitting: Physician Assistant

## 2023-12-03 DIAGNOSIS — Z8546 Personal history of malignant neoplasm of prostate: Secondary | ICD-10-CM | POA: Diagnosis not present

## 2023-12-03 DIAGNOSIS — N403 Nodular prostate with lower urinary tract symptoms: Secondary | ICD-10-CM | POA: Diagnosis not present

## 2023-12-03 DIAGNOSIS — R351 Nocturia: Secondary | ICD-10-CM | POA: Diagnosis not present

## 2023-12-03 DIAGNOSIS — E349 Endocrine disorder, unspecified: Secondary | ICD-10-CM | POA: Diagnosis not present

## 2023-12-03 MED ORDER — DAPAGLIFLOZIN PROPANEDIOL 5 MG PO TABS: 5.0000 mg | ORAL_TABLET | Freq: Every day | ORAL | 1 refills | Status: DC

## 2023-12-04 ENCOUNTER — Encounter: Payer: Self-pay | Admitting: Physician Assistant

## 2023-12-04 LAB — TESTOSTERONE
Testosterone: 18.9
Testosterone: 22.5
Testosterone: 23.3
Testosterone: 26
Testosterone: 38.1

## 2023-12-05 DIAGNOSIS — H02115 Cicatricial ectropion of left lower eyelid: Secondary | ICD-10-CM | POA: Diagnosis not present

## 2023-12-05 DIAGNOSIS — H00015 Hordeolum externum left lower eyelid: Secondary | ICD-10-CM | POA: Diagnosis not present

## 2023-12-17 ENCOUNTER — Ambulatory Visit (INDEPENDENT_AMBULATORY_CARE_PROVIDER_SITE_OTHER): Payer: PPO

## 2023-12-17 VITALS — BP 144/73 | Ht 70.0 in | Wt 196.0 lb

## 2023-12-17 DIAGNOSIS — Z0000 Encounter for general adult medical examination without abnormal findings: Secondary | ICD-10-CM | POA: Diagnosis not present

## 2023-12-17 NOTE — Progress Notes (Unsigned)
 Subjective:   Nicholas Maxwell is a 78 y.o. who presents for a Medicare Wellness preventive visit.  Visit Complete: Virtual I connected with  [name] on 12/18/23 by a audio enabled telemedicine application and verified that I am speaking with the correct person using two identifiers.  Patient Location: Home  Provider Location: Home Office  I discussed the limitations of evaluation and management by telemedicine. The patient expressed understanding and agreed to proceed.  Vital Signs: Because this visit was a virtual/telehealth visit, some criteria may be missing or patient reported. Any vitals not documented were not able to be obtained and vitals that have been documented are patient reported.  VideoDeclined- This patient declined Librarian, academic. Therefore the visit was completed with audio only.  Persons Participating in Visit: Patient.  AWV Questionnaire: No: Patient Medicare AWV questionnaire was not completed prior to this visit.  Cardiac Risk Factors include: advanced age (>8men, >71 women);diabetes mellitus;male gender (Myocardial infarction [facility]))     Objective:    Today's Vitals   12/17/23 1329  BP: (!) 144/73  Weight: 196 lb (88.9 kg)  Height: 5\' 10"  (1.778 m)   Body mass index is 28.12 kg/m.     12/17/2023    1:45 PM 07/19/2023    9:19 AM 12/07/2022    2:04 PM 11/21/2021    1:11 PM 11/24/2020    8:38 AM 11/15/2020    1:58 PM 06/29/2020    4:32 PM  Advanced Directives  Does Patient Have a Medical Advance Directive? Yes Yes Yes Yes Yes Yes Yes  Type of Estate agent of [place];Living will   Healthcare Power of [employer] of [place];Living will Healthcare Power of [employer] of [place];Living will  Does patient want to make changes to medical advance directive?       No - Patient declined  Copy of Healthcare Power of Attorney in Chart? Yes - validated most recent copy  scanned in chart (See row information)   Yes - validated most recent copy scanned in chart (See row information)  Yes - validated most recent copy scanned in chart (See row information) No - copy requested  Would patient like information on creating a medical advance directive?   Yes (MAU/Ambulatory/Procedural Areas - Information given)        Current Medications (verified) Outpatient Encounter Medications as of 12/17/2023  Medication Sig   ALFUZOSIN HCL ER PO Take 10 mg by mouth daily in the afternoon. Prescribed by Dr. Annabell Howells   allopurinol (ZYLOPRIM) 300 MG tablet TAKE 1 TABLET BY MOUTH DAILY WITH LUNCH. (Patient taking differently: 150 mg.)   ASPIRIN 81 81 MG chewable tablet Chew 81 mg by mouth daily.   Blood Glucose Monitoring Suppl (ONE TOUCH ULTRA 2) w/Device KIT USE TO CHECK BLOOD SUGAR   dapagliflozin propanediol (FARXIGA) 5 MG TABS tablet Take 1 tablet (5 mg total) by mouth daily before breakfast.   Evolocumab (REPATHA SURECLICK) 140 MG/ML SOAJ Inject 140 mg into the skin every 14 (fourteen) days.   glucose blood (ONETOUCH ULTRA) test strip USE TO CHECK BLOOD SUGAR DAILY AND PRN   losartan (COZAAR) 25 MG tablet Take 1 tablet (25 mg total) by mouth daily.   metoprolol succinate (TOPROL-XL) 25 MG 24 hr tablet TAKE 1 TABLET BY MOUTH EVERY DAY   nitroGLYCERIN (NITROSTAT) 0.4 MG SL tablet Place 1 tablet (0.4 mg total) under the tongue every 5 (five) minutes x 3 doses as needed for chest pain.  ranolazine (RANEXA) 1000 MG SR tablet TAKE 1 TABLET BY MOUTH TWICE A DAY   No facility-administered encounter medications on file as of 12/17/2023.    Allergies (verified) Crestor [rosuvastatin calcium] and Statins   History: Past Medical History:  Diagnosis Date   Allergy 12/1998   Seasonal spring and fall. Crestor and statins.   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   Statin intolerance    Used >2 different statins   Type 2 diabetes mellitus without complication, without long-term current use of insulin (HCC) 05/12/2015   Past Surgical History:  Procedure Laterality Date   COLONOSCOPY     March of 2018    CORONARY ANGIOPLASTY WITH STENT PLACEMENT  05/24/2017   CORONARY PRESSURE/FFR STUDY N/A 05/24/2017   Procedure: INTRAVASCULAR PRESSURE WIRE/FFR STUDY;  Surgeon: Elder Negus, MD;  [facility];  Service: Cardiovascular;  Laterality: N/A;  MID LAD   CORONARY STENT INTERVENTION N/A 05/24/2017   Procedure: CORONARY STENT INTERVENTION;  Surgeon: Elder Negus, MD;  [facility];  Service: Cardiovascular;  Laterality: N/A;  MID Circumflex 4.0x22 Onyx MID LAD 2.75x15 Onyx   CYSTOSCOPY WITH URETEROSCOPY, STONE BASKETRY AND STENT PLACEMENT  ~ 1990   HERNIA REPAIR     JOINT REPLACEMENT  10/2019   Right knee   KNEE ARTHROSCOPY Right 2016   LEFT HEART CATH AND CORONARY ANGIOGRAPHY N/A 05/24/2017   Procedure: LEFT HEART CATH AND CORONARY ANGIOGRAPHY;  Surgeon: Elder Negus, MD;  [facility];  Service: Cardiovascular;  Laterality: N/A;   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   Procedure: TOTAL KNEE ARTHROPLASTY;  Surgeon: Durene Romans, MD;  [facility]: [facility];  Service: Orthopedics;  Laterality: Right;  70 mins   UMBILICAL HERNIA REPAIR  1992   UPPER GASTROINTESTINAL ENDOSCOPY     Family History  Problem Relation Age of Onset   Transient ischemic attack Father 38   Bone cancer Father    Cancer Father    Stroke Father    CAD Mother 78       CABG   Diabetes Mother    COPD Mother    Breast cancer Neg Hx    Colon cancer Neg Hx    Pancreatic cancer Neg Hx    Prostate cancer Neg Hx    Social History   Socioeconomic History   Marital status: Married    Spouse name: Not on file   Number of children: 2   Years of education: Not on file   Highest  education level: Some college, no degree  Occupational History   Occupation: retired    Comment: retired  Tobacco Use   Smoking status: Former    Current packs/day: 0.00    Average packs/day: 1 pack/day for 30.0 years (30.0 ttl pk-yrs)    Types: Cigarettes    Start date: 10/03/1971    Quit date: 10/02/2001    Years since quitting: 22.2   Smokeless tobacco: Never  Vaping Use   Vaping status: Never Used  Substance and Sexual Activity   Alcohol use: Not Currently    Comment: has not drank since 2014   Drug use: No   Sexual activity: Not Currently  Other Topics Concern   Not on file  Social History Narrative   Married   Son and daughter  Grandchildren   Worked in Optician, dispensing   Social Drivers of Health   Financial Resource Strain: Low Risk  (12/17/2023)   Overall Financial Resource Strain (CARDIA)    Difficulty of Paying Living Expenses: Not very hard  Recent Concern: Financial Resource Strain - High Risk (12/17/2023)   Overall Financial Resource Strain (CARDIA)    Difficulty of Paying Living Expenses: Very hard  Food Insecurity: No Food Insecurity (12/17/2023)   Hunger Vital Sign    Worried About Running Out of Food in the Last Year: Never true    Ran Out of Food in the Last Year: Never true  Transportation Needs: No Transportation Needs (12/17/2023)   PRAPARE - Administrator, Civil Service (Medical): No    Lack of Transportation (Non-Medical): No  Physical Activity: Sufficiently Active (11/22/2023)   Exercise Vital Sign    Days of Exercise per Week: 3 days    Minutes of Exercise per Session: 60 min  Stress: No Stress Concern Present (12/17/2023)   [employer] of Occupational Health - Occupational Stress Questionnaire    Feeling of Stress : Not at all  Social Connections: Socially Integrated (12/17/2023)   Social Connection and Isolation Panel [NHANES]    Frequency of Communication with Friends and Family: More than three times a week    Frequency of Social  Gatherings with Friends and Family: More than three times a week    Attends Religious Services: More than 4 times per year    Active Member of [employer] or Organizations: Yes    Attends Engineer, structural: More than 4 times per year    Marital Status: Married    Tobacco Counseling Counseling given: Yes    Clinical Intake:  Pre-visit preparation completed: Yes  Pain : No/denies pain     BMI - recorded: 28.12 Nutritional Status: BMI 25 -29 Overweight Nutritional Risks: Other (Comment) Diabetes: Yes CBG done?: Yes CBG resulted in Enter/ Edit results?: No Did pt. bring in CBG monitor from home?: No (phone visit)  How often do you need to have someone help you when you read instructions, pamphlets, or other written materials from your doctor or pharmacy?: 1 - Never What is the last grade level you completed in school?: college  Interpreter Needed?: No  Information entered by :: Cleophus Molt, CMA   Activities of Daily Living     12/17/2023    1:40 PM  In your present state of health, do you have any difficulty performing the following activities:  Hearing? 0  Vision? 0  Difficulty concentrating or making decisions? 0  Walking or climbing stairs? 0  Dressing or bathing? 0  Doing errands, shopping? 0  Preparing Food and eating ? N  Using the Toilet? N  In the past six months, have you accidently leaked urine? N  Do you have problems with loss of bowel control? N  Managing your Medications? N  Managing your Finances? N  Housekeeping or managing your Housekeeping? N    Patient Care Team: Jarold Motto, [place] as PCP - General (Physician Assistant) Bjorn Pippin, MD as Attending Physician (Urology) Rutherford Guys., MD as Referring Physician (Gastroenterology) Elder Negus, MD as Consulting Physician (Cardiology) Alric Quan, [facility] (Gastroenterology) Durene Romans, MD as Consulting Physician (Orthopedic Surgery) Erroll Luna, [facility]  (Inactive) (Pharmacist) [facility] as Consulting Physician (Optometry)  Indicate any recent Medical Services you may have received from other than [facility] providers in the past year (date may be approximate).  Assessment:   This is a routine wellness examination for [place].  Hearing/Vision screen Hearing Screening - Comments:: Pt denied hearing def Vision Screening - Comments:: Made appt 04/03/24--going [facility] on [address]   Goals Addressed             This Visit's Progress    DIET - EAT MORE FRUITS AND VEGETABLES   On track    Patient Stated   On track    Lose 10 lbs      Patient Stated   On track    Get some weight off     Patient Stated   On track    Lose some weight        Depression Screen     12/17/2023    1:52 PM 05/23/2023    9:16 AM 12/07/2022    2:03 PM 11/17/2022    9:13 AM 11/21/2021    1:10 PM 11/15/2020    1:56 PM 09/07/2020    1:06 PM  PHQ 2/9 Scores  PHQ - 2 Score 0 0 0 0 0 0 0  PHQ- 9 Score 0          Fall Risk     12/17/2023    1:50 PM 12/06/2022    4:48 PM 11/30/2022    7:34 PM 12/19/2021    2:44 PM 11/21/2021    1:12 PM  Fall Risk   Falls in the past year? 1 0 0 0 0  Number falls in past yr: 1 0 0 0 0  Injury with Fall? 1 0 0 0 0  Risk for fall due to : History of fall(s);Impaired balance/gait;Orthopedic patient Impaired vision  No Fall Risks Impaired vision  Follow up Falls prevention discussed;Falls evaluation completed Falls prevention discussed   Falls prevention discussed    MEDICARE RISK AT HOME:  Medicare Risk at Home Any stairs in or around the home?: No If so, are there any without handrails?: Yes Home free of loose throw rugs in walkways, pet beds, electrical cords, etc?: Yes Adequate lighting in your home to reduce risk of falls?: Yes Life alert?: No Use of a cane, walker or w/c?: No Grab bars in the bathroom?: Yes Shower chair or bench in shower?: Yes Elevated toilet seat or a handicapped toilet?: No  TIMED  UP AND GO:  Was the test performed?  No  Cognitive Function: 6CIT completed        12/17/2023    1:57 PM 12/07/2022    2:09 PM 11/21/2021    1:14 PM 11/15/2020    2:02 PM 08/25/2019   10:20 AM  6CIT Screen  What Year? 0 points 0 points 0 points 0 points 0 points  What month? 0 points 0 points 0 points 0 points 0 points  What time? 0 points 0 points 0 points  0 points  Count back from 20 0 points 0 points 0 points 0 points 0 points  Months in reverse 0 points 0 points 0 points 0 points 0 points  Repeat phrase 0 points 0 points 0 points 0 points 0 points  Total Score 0 points 0 points 0 points  0 points    Immunizations Immunization History  Administered Date(s) Administered   Fluad Quad(high Dose 65+) 07/10/2016, 06/27/2017, 05/28/2019, 06/08/2020, 06/14/2021, 07/18/2022   Fluad Trivalent(High Dose 65+) 07/17/2023   Influenza, High Dose Seasonal PF 07/10/2016, 06/27/2017, 06/11/2018   Influenza-Unspecified 07/05/2010, 07/17/2011, 07/08/2012, 06/24/2013, 07/21/2014, 06/28/2015, 07/10/2016, 06/27/2017   Pneumococcal Conjugate-13 11/09/2016  Pneumococcal Polysaccharide-23 10/07/2012   Tdap 02/22/2018   Zoster, Live 05/16/2013    Screening Tests Health Maintenance  Topic Date Due   Zoster Vaccines- Shingrix (1 of 2) 11/25/2024 (Originally 02/20/1965)   OPHTHALMOLOGY EXAM  05/22/2037 (Originally 04/25/2022)   Diabetic kidney evaluation - Urine ACR  05/22/2024   FOOT EXAM  05/22/2024   HEMOGLOBIN A1C  05/25/2024   Diabetic kidney evaluation - eGFR measurement  10/09/2024   Medicare Annual Wellness (AWV)  12/16/2024   DTaP/Tdap/Td (2 - Td or Tdap) 02/23/2028   Pneumonia Vaccine 15+ Years old  Completed   INFLUENZA VACCINE  Completed   Hepatitis C Screening  Completed   HPV VACCINES  Aged Out   Colonoscopy  Discontinued   COVID-19 Vaccine  Discontinued    Health Maintenance  There are no preventive care reminders to display for this patient.  Health Maintenance Items  Addressed: See Nurse Notes  Additional Screening:  Vision Screening: Recommended annual ophthalmology exams for early detection of glaucoma and other disorders of the eye.  Dental Screening: Recommended annual dental exams for proper oral hygiene  Community Resource Referral / Chronic Care Management: CRR required this visit?  No   CCM required this visit?  No     Plan:     I have personally reviewed and noted the following in the patient's chart:   Medical and social history Use of alcohol, tobacco or illicit drugs  Current medications and supplements including opioid prescriptions. Patient is not currently taking opioid prescriptions. Functional ability and status Nutritional status Physical activity Advanced directives List of other physicians Hospitalizations, surgeries, and ER visits in previous 12 months Vitals Screenings to include cognitive, depression, and falls Referrals and appointments  In addition, I have reviewed and discussed with patient certain preventive protocols, quality metrics, and best practice recommendations. A written personalized care plan for preventive services as well as general preventive health recommendations were provided to patient.     Arta Silence, CMA   12/18/2023   After Visit Summary: (MyChart) Due to this being a telephonic visit, the after visit summary with patients personalized plan was offered to patient via MyChart   Notes: Please discuss shingles vaccine to pt interested. Pt also like to discuss the Mental Health Advanced Directives as well. Pt is due for a Diabetic Eye Exam, already has appt set.

## 2023-12-17 NOTE — Patient Instructions (Signed)
 Mr. Pankratz , Thank you for taking time to come for your Medicare Wellness Visit. I appreciate your ongoing commitment to your health goals. Please review the following plan we discussed and let me know if I can assist you in the future.   Referrals/Orders/Follow-Ups/Clinician Recommendations: Please discuss with your provider about getting your shingles vaccine done. Also, don't forget to pick a Mental Health Advanced Directives from the clinic and discuss with provider.   This is a list of the screening recommended for you and due dates:  Health Maintenance  Topic Date Due   Zoster (Shingles) Vaccine (1 of 2) 11/25/2024*   Eye exam for diabetics  05/22/2037*   Yearly kidney health urinalysis for diabetes  05/22/2024   Complete foot exam   05/22/2024   Hemoglobin A1C  05/25/2024   Yearly kidney function blood test for diabetes  10/09/2024   Medicare Annual Wellness Visit  12/16/2024   DTaP/Tdap/Td vaccine (2 - Td or Tdap) 02/23/2028   Pneumonia Vaccine  Completed   Flu Shot  Completed   Hepatitis C Screening  Completed   HPV Vaccine  Aged Out   Colon Cancer Screening  Discontinued   COVID-19 Vaccine  Discontinued  *Topic was postponed. The date shown is not the original due date.    Advanced directives: (In Chart) A copy of your advanced directives are scanned into your chart should your provider ever need it.  Next Medicare Annual Wellness Visit scheduled for next year: Yes

## 2024-01-11 ENCOUNTER — Other Ambulatory Visit: Payer: Self-pay | Admitting: *Deleted

## 2024-01-11 MED ORDER — ALLOPURINOL 300 MG PO TABS
300.0000 mg | ORAL_TABLET | Freq: Every day | ORAL | 1 refills | Status: AC
Start: 2024-01-11 — End: ?

## 2024-01-25 ENCOUNTER — Encounter: Payer: Self-pay | Admitting: Physician Assistant

## 2024-01-28 MED ORDER — DAPAGLIFLOZIN PROPANEDIOL 5 MG PO TABS
5.0000 mg | ORAL_TABLET | Freq: Every day | ORAL | 1 refills | Status: DC
Start: 2024-01-28 — End: 2024-03-31

## 2024-02-04 ENCOUNTER — Inpatient Hospital Stay: Payer: PPO

## 2024-02-04 DIAGNOSIS — D631 Anemia in chronic kidney disease: Secondary | ICD-10-CM | POA: Diagnosis not present

## 2024-02-04 DIAGNOSIS — C61 Malignant neoplasm of prostate: Secondary | ICD-10-CM | POA: Insufficient documentation

## 2024-02-04 DIAGNOSIS — E1122 Type 2 diabetes mellitus with diabetic chronic kidney disease: Secondary | ICD-10-CM | POA: Diagnosis not present

## 2024-02-04 DIAGNOSIS — Z79899 Other long term (current) drug therapy: Secondary | ICD-10-CM | POA: Diagnosis not present

## 2024-02-04 DIAGNOSIS — N1832 Chronic kidney disease, stage 3b: Secondary | ICD-10-CM | POA: Diagnosis not present

## 2024-02-04 DIAGNOSIS — R768 Other specified abnormal immunological findings in serum: Secondary | ICD-10-CM

## 2024-02-04 DIAGNOSIS — M7042 Prepatellar bursitis, left knee: Secondary | ICD-10-CM | POA: Diagnosis not present

## 2024-02-04 DIAGNOSIS — D7589 Other specified diseases of blood and blood-forming organs: Secondary | ICD-10-CM | POA: Insufficient documentation

## 2024-02-04 DIAGNOSIS — D539 Nutritional anemia, unspecified: Secondary | ICD-10-CM

## 2024-02-04 LAB — CBC WITH DIFFERENTIAL (CANCER CENTER ONLY)
Abs Immature Granulocytes: 0.01 10*3/uL (ref 0.00–0.07)
Basophils Absolute: 0 10*3/uL (ref 0.0–0.1)
Basophils Relative: 1 %
Eosinophils Absolute: 0.1 10*3/uL (ref 0.0–0.5)
Eosinophils Relative: 1 %
HCT: 38 % — ABNORMAL LOW (ref 39.0–52.0)
Hemoglobin: 13.3 g/dL (ref 13.0–17.0)
Immature Granulocytes: 0 %
Lymphocytes Relative: 21 %
Lymphs Abs: 1 10*3/uL (ref 0.7–4.0)
MCH: 34.4 pg — ABNORMAL HIGH (ref 26.0–34.0)
MCHC: 35 g/dL (ref 30.0–36.0)
MCV: 98.2 fL (ref 80.0–100.0)
Monocytes Absolute: 0.3 10*3/uL (ref 0.1–1.0)
Monocytes Relative: 7 %
Neutro Abs: 3.4 10*3/uL (ref 1.7–7.7)
Neutrophils Relative %: 70 %
Platelet Count: 179 10*3/uL (ref 150–400)
RBC: 3.87 MIL/uL — ABNORMAL LOW (ref 4.22–5.81)
RDW: 12.4 % (ref 11.5–15.5)
WBC Count: 4.9 10*3/uL (ref 4.0–10.5)
nRBC: 0 % (ref 0.0–0.2)

## 2024-02-04 LAB — CMP (CANCER CENTER ONLY)
ALT: 16 U/L (ref 0–44)
AST: 15 U/L (ref 15–41)
Albumin: 4.7 g/dL (ref 3.5–5.0)
Alkaline Phosphatase: 59 U/L (ref 38–126)
Anion gap: 7 (ref 5–15)
BUN: 29 mg/dL — ABNORMAL HIGH (ref 8–23)
CO2: 27 mmol/L (ref 22–32)
Calcium: 9.6 mg/dL (ref 8.9–10.3)
Chloride: 103 mmol/L (ref 98–111)
Creatinine: 1.79 mg/dL — ABNORMAL HIGH (ref 0.61–1.24)
GFR, Estimated: 39 mL/min — ABNORMAL LOW (ref >60)
Glucose, Bld: 133 mg/dL — ABNORMAL HIGH (ref 70–99)
Potassium: 4.5 mmol/L (ref 3.5–5.1)
Sodium: 137 mmol/L (ref 135–145)
Total Bilirubin: 0.6 mg/dL (ref 0.0–1.2)
Total Protein: 7.7 g/dL (ref 6.5–8.1)

## 2024-02-04 LAB — TSH: TSH: 2.6 u[IU]/mL (ref 0.350–4.500)

## 2024-02-04 LAB — T4, FREE: Free T4: 0.66 ng/dL (ref 0.61–1.12)

## 2024-02-05 LAB — KAPPA/LAMBDA LIGHT CHAINS
Kappa free light chain: 30.1 mg/L — ABNORMAL HIGH (ref 3.3–19.4)
Kappa, lambda light chain ratio: 1.71 — ABNORMAL HIGH (ref 0.26–1.65)
Lambda free light chains: 17.6 mg/L (ref 5.7–26.3)

## 2024-02-05 LAB — IGG, IGA, IGM
IgA: 203 mg/dL (ref 61–437)
IgG (Immunoglobin G), Serum: 1190 mg/dL (ref 603–1613)
IgM (Immunoglobulin M), Srm: 68 mg/dL (ref 15–143)

## 2024-02-07 ENCOUNTER — Telehealth: Payer: Self-pay

## 2024-02-07 LAB — PROTEIN ELECTROPHORESIS, SERUM, WITH REFLEX
A/G Ratio: 1.3 (ref 0.7–1.7)
Albumin ELP: 4 g/dL (ref 2.9–4.4)
Alpha-1-Globulin: 0.2 g/dL (ref 0.0–0.4)
Alpha-2-Globulin: 0.7 g/dL (ref 0.4–1.0)
Beta Globulin: 1.1 g/dL (ref 0.7–1.3)
Gamma Globulin: 1.1 g/dL (ref 0.4–1.8)
Globulin, Total: 3.2 g/dL (ref 2.2–3.9)
Total Protein ELP: 7.2 g/dL (ref 6.0–8.5)

## 2024-02-07 NOTE — Telephone Encounter (Signed)
-----   Message from Nicholas Maxwell sent at 02/07/2024  9:41 AM EDT ----- [name] please let patient know to consume 604 oz of fluid daily, avoid dehydration. So renal function can stay stable.  Not other concerning finding. May discuss other stable results in upcoming visit.  Thanks

## 2024-02-07 NOTE — Telephone Encounter (Signed)
 TC to pt to inform of the below message by Dr. Alita Irwin. No answer. Left message on personalized VM, informing him to increase fluid intake to 64 oz per day.

## 2024-02-17 NOTE — Progress Notes (Unsigned)
 [facility] OFFICE PROGRESS NOTE  Patient Care Team: Alexander Iba, [place] as PCP - General (Physician Assistant) Homero Luster, MD as Attending Physician (Urology) Ramonita Burow., MD as Referring Physician (Gastroenterology) Cody Das, MD as Consulting Physician (Cardiology) Anell Baptist, [facility] (Gastroenterology) Claiborne Crew, MD as Consulting Physician (Orthopedic Surgery) Myrle Aspen, [facility] (Inactive) (Pharmacist) [facility] as Consulting Physician (Optometry)  78 y.o. 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   Laboratory studies show microcytic anemia. Normal LFT, B12. He has CKD. No clear findings so far. PSA 0.15 in 05/2023.   Nicholas Maxwell has no concerning symptoms, overall he is feeling well.  His anemia improved. He has renal insufficiency. This can result in elevated LC. We went over the results today.  Slight abnormality in light chains but no monoclonal protein identified.    Assessment & Plan Chronic kidney disease, stage 3b (HCC) Cr 1.79 1.3-1.7 over last 3 years. Elevated serum immunoglobulin free light chain level Will follow up in about 3 months Macrocytic anemia Stable. Continue monitor Malignant neoplasm of prostate [facility]) Definitive Radiotherapy: 09/22/20-11/03/20 Site/dose:   The prostate was treated to 70 Gy in 28 fractions of 2.5 Gy Following urology  Last PSA 0.17. nadir 0.024 in 10/2021. Plan to follow up every 6 months with Dr. Inga Manges  Orders Placed This Encounter  Procedures   CBC with Differential (Cancer Center Only)    Standing Status:   Future    Expiration Date:   02/17/2025   Comprehensive metabolic panel with GFR    Standing Status:   Future    Expiration Date:   02/17/2025   Methylmalonic acid, serum     Standing Status:   Future    Expiration Date:   06/20/2024   Vitamin B12    Standing Status:   Future    Expiration Date:   02/17/2025   Folate    Standing Status:   Future    Expiration Date:   02/17/2025   Serum protein electrophoresis with reflex    Standing Status:   Future    Expiration Date:   02/17/2025   Kappa/lambda light chains    Standing Status:   Future    Expiration Date:   02/17/2025   IgG, IgA, IgM    Standing Status:   Future    Expiration Date:   02/17/2025     Nicholas Ruddy, MD  INTERVAL HISTORY: Patient returns for follow-up. Report started a new medication for diabetes.  Report he saw eye doctor. Overall feeling well. No night sweats, weight loss or decreased appetite. No new bone or back pain, stomach pain, coughing, bleeding, bloody urine or stool.  Seeing Urology for history of prostate cancer treatment.  He drinks enough fluid.  Previous history  Nicholas Maxwell 78 y.o. male was initially seen by me because of anemia in Oct 2024. Labs showed macrocytosis. Hemoglobin has been slightly low. He reports just feels tired. He was more tired during ADT for prostate cancer and needed a nap then but not anymore. 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    No night sweats, unexpected weight loss, decreased appetite, mass or lump, abdominal distention, unexpected bleeding or bruising. Wife reports his stomach is bigger. No stomach pain, nausea, vomiting, bloody or dark  stool. Colonoscopy is up to date.   He eats fruits and vegetable everyday, not a vegetarian. He does not take NSAID. He takes aspirin  81 mg once daily.    Laboratory studies showed normal folate, ferritin.  Borderline macrocytic anemia.  Hemoglobin improved compared to previously. B12 was low in 2021 andr resolved.  LFT was normal.  Mild renal insufficiency.   Kappa light chain slightly above normal.  No monoclonal protein identified.  The immunofixation pattern appears unremarkable. Evidence  of monoclonal protein is not apparent.   Follow up showed stable macrocytic anemia. No neutropenia or thrombocytopenia.  No monoclonal protein identified. Borderline elevated Kappa with K/L of 1.71 with chronic kidney disease with Cr. 1.79  Oncology History  Malignant neoplasm of prostate (HCC)  05/05/2020 Cancer Staging   Staging form: Prostate, AJCC 8th Edition - Clinical stage from 05/05/2020: Stage IIC (cT2a, cN0, cM0, PSA: 2.6, Grade Group: 3) - Signed by Keitha Pata, PA-C on 06/29/2020   06/11/2020 Initial Diagnosis   Malignant neoplasm of prostate (HCC)   09/22/2020 - 11/03/2020 Radiation Therapy   Radiation treatment dates:   09/22/20-11/03/20   Site/dose:   The prostate was treated to 70 Gy in 28 fractions of 2.5 Gy      PHYSICAL EXAMINATION: ECOG PERFORMANCE STATUS: 1 - Symptomatic but completely ambulatory  Vitals:   02/18/24 1125  BP: 125/60  Pulse: (!) 55  Resp: 18  Temp: (!) 97.2 F (36.2 C)  SpO2: 98%   Filed Weights   02/18/24 1125  Weight: 199 lb 6.4 oz (90.4 kg)   No point tenderness No pitting edema  Relevant data reviewed during this visit included labs.

## 2024-02-18 ENCOUNTER — Inpatient Hospital Stay: Payer: PPO

## 2024-02-18 VITALS — BP 125/60 | HR 55 | Temp 97.2°F | Resp 18 | Wt 199.4 lb

## 2024-02-18 DIAGNOSIS — C61 Malignant neoplasm of prostate: Secondary | ICD-10-CM

## 2024-02-18 DIAGNOSIS — D539 Nutritional anemia, unspecified: Secondary | ICD-10-CM

## 2024-02-18 DIAGNOSIS — R768 Other specified abnormal immunological findings in serum: Secondary | ICD-10-CM | POA: Insufficient documentation

## 2024-02-18 DIAGNOSIS — N1832 Chronic kidney disease, stage 3b: Secondary | ICD-10-CM

## 2024-02-18 HISTORY — DX: Nutritional anemia, unspecified: D53.9

## 2024-02-18 NOTE — Assessment & Plan Note (Addendum)
 Stable. Continue monitor

## 2024-02-18 NOTE — Assessment & Plan Note (Addendum)
 Cr 1.79 1.3-1.7 over last 3 years.

## 2024-02-18 NOTE — Assessment & Plan Note (Addendum)
Will follow up in about 3 months

## 2024-02-18 NOTE — Assessment & Plan Note (Addendum)
 Definitive Radiotherapy: 09/22/20-11/03/20 Site/dose:   The prostate was treated to 70 Gy in 28 fractions of 2.5 Gy Following urology  Last PSA 0.17. nadir 0.024 in 10/2021. Plan to follow up every 6 months with Dr. Wrenn

## 2024-03-05 DIAGNOSIS — L578 Other skin changes due to chronic exposure to nonionizing radiation: Secondary | ICD-10-CM | POA: Diagnosis not present

## 2024-03-05 DIAGNOSIS — L821 Other seborrheic keratosis: Secondary | ICD-10-CM | POA: Diagnosis not present

## 2024-03-05 DIAGNOSIS — L814 Other melanin hyperpigmentation: Secondary | ICD-10-CM | POA: Diagnosis not present

## 2024-03-29 ENCOUNTER — Other Ambulatory Visit: Payer: Self-pay | Admitting: Physician Assistant

## 2024-03-30 ENCOUNTER — Encounter: Payer: Self-pay | Admitting: Physician Assistant

## 2024-03-30 DIAGNOSIS — E119 Type 2 diabetes mellitus without complications: Secondary | ICD-10-CM

## 2024-03-31 ENCOUNTER — Other Ambulatory Visit

## 2024-03-31 NOTE — Telephone Encounter (Signed)
 Duplicate

## 2024-04-02 ENCOUNTER — Other Ambulatory Visit (INDEPENDENT_AMBULATORY_CARE_PROVIDER_SITE_OTHER)

## 2024-04-02 ENCOUNTER — Ambulatory Visit: Payer: Self-pay | Admitting: Physician Assistant

## 2024-04-02 DIAGNOSIS — E119 Type 2 diabetes mellitus without complications: Secondary | ICD-10-CM

## 2024-04-02 LAB — HEMOGLOBIN A1C: Hgb A1c MFr Bld: 6.2 % (ref 4.6–6.5)

## 2024-04-05 ENCOUNTER — Encounter: Payer: Self-pay | Admitting: Cardiology

## 2024-04-05 ENCOUNTER — Other Ambulatory Visit: Payer: Self-pay | Admitting: Cardiology

## 2024-04-07 ENCOUNTER — Other Ambulatory Visit: Payer: Self-pay | Admitting: Cardiology

## 2024-04-07 ENCOUNTER — Other Ambulatory Visit: Payer: Self-pay

## 2024-04-09 DIAGNOSIS — E119 Type 2 diabetes mellitus without complications: Secondary | ICD-10-CM | POA: Diagnosis not present

## 2024-04-09 DIAGNOSIS — H353131 Nonexudative age-related macular degeneration, bilateral, early dry stage: Secondary | ICD-10-CM | POA: Diagnosis not present

## 2024-04-09 DIAGNOSIS — H02015 Cicatricial entropion of left lower eyelid: Secondary | ICD-10-CM | POA: Diagnosis not present

## 2024-04-09 DIAGNOSIS — H02012 Cicatricial entropion of right lower eyelid: Secondary | ICD-10-CM | POA: Diagnosis not present

## 2024-04-09 DIAGNOSIS — H25813 Combined forms of age-related cataract, bilateral: Secondary | ICD-10-CM | POA: Diagnosis not present

## 2024-04-09 LAB — HM DIABETES EYE EXAM

## 2024-05-05 ENCOUNTER — Inpatient Hospital Stay

## 2024-05-05 DIAGNOSIS — Z79899 Other long term (current) drug therapy: Secondary | ICD-10-CM | POA: Diagnosis not present

## 2024-05-05 DIAGNOSIS — C61 Malignant neoplasm of prostate: Secondary | ICD-10-CM | POA: Diagnosis not present

## 2024-05-05 DIAGNOSIS — N1832 Chronic kidney disease, stage 3b: Secondary | ICD-10-CM | POA: Insufficient documentation

## 2024-05-05 DIAGNOSIS — D539 Nutritional anemia, unspecified: Secondary | ICD-10-CM | POA: Diagnosis not present

## 2024-05-05 DIAGNOSIS — R768 Other specified abnormal immunological findings in serum: Secondary | ICD-10-CM

## 2024-05-05 LAB — COMPREHENSIVE METABOLIC PANEL WITH GFR
ALT: 14 U/L (ref 0–44)
AST: 13 U/L — ABNORMAL LOW (ref 15–41)
Albumin: 4.3 g/dL (ref 3.5–5.0)
Alkaline Phosphatase: 54 U/L (ref 38–126)
Anion gap: 5 (ref 5–15)
BUN: 26 mg/dL — ABNORMAL HIGH (ref 8–23)
CO2: 28 mmol/L (ref 22–32)
Calcium: 9.3 mg/dL (ref 8.9–10.3)
Chloride: 103 mmol/L (ref 98–111)
Creatinine, Ser: 1.6 mg/dL — ABNORMAL HIGH (ref 0.61–1.24)
GFR, Estimated: 44 mL/min — ABNORMAL LOW (ref >60)
Glucose, Bld: 126 mg/dL — ABNORMAL HIGH (ref 70–99)
Potassium: 4.6 mmol/L (ref 3.5–5.1)
Sodium: 136 mmol/L (ref 135–145)
Total Bilirubin: 0.6 mg/dL (ref 0.0–1.2)
Total Protein: 7.2 g/dL (ref 6.5–8.1)

## 2024-05-05 LAB — CBC WITH DIFFERENTIAL (CANCER CENTER ONLY)
Abs Immature Granulocytes: 0.01 K/uL (ref 0.00–0.07)
Basophils Absolute: 0 K/uL (ref 0.0–0.1)
Basophils Relative: 1 %
Eosinophils Absolute: 0.1 K/uL (ref 0.0–0.5)
Eosinophils Relative: 1 %
HCT: 37.4 % — ABNORMAL LOW (ref 39.0–52.0)
Hemoglobin: 12.9 g/dL — ABNORMAL LOW (ref 13.0–17.0)
Immature Granulocytes: 0 %
Lymphocytes Relative: 20 %
Lymphs Abs: 1.1 K/uL (ref 0.7–4.0)
MCH: 34 pg (ref 26.0–34.0)
MCHC: 34.5 g/dL (ref 30.0–36.0)
MCV: 98.7 fL (ref 80.0–100.0)
Monocytes Absolute: 0.4 K/uL (ref 0.1–1.0)
Monocytes Relative: 7 %
Neutro Abs: 3.9 K/uL (ref 1.7–7.7)
Neutrophils Relative %: 71 %
Platelet Count: 178 K/uL (ref 150–400)
RBC: 3.79 MIL/uL — ABNORMAL LOW (ref 4.22–5.81)
RDW: 12.4 % (ref 11.5–15.5)
WBC Count: 5.4 K/uL (ref 4.0–10.5)
nRBC: 0 % (ref 0.0–0.2)

## 2024-05-05 LAB — T4, FREE: Free T4: 0.73 ng/dL (ref 0.61–1.12)

## 2024-05-05 LAB — VITAMIN B12: Vitamin B-12: 232 pg/mL (ref 180–914)

## 2024-05-05 LAB — TSH: TSH: 2.26 u[IU]/mL (ref 0.350–4.500)

## 2024-05-05 LAB — FOLATE: Folate: 16.5 ng/mL (ref >5.9)

## 2024-05-05 LAB — LACTATE DEHYDROGENASE: LDH: 129 U/L (ref 98–192)

## 2024-05-06 LAB — IGG, IGA, IGM
IgA: 186 mg/dL (ref 61–437)
IgG (Immunoglobin G), Serum: 1130 mg/dL (ref 603–1613)
IgM (Immunoglobulin M), Srm: 66 mg/dL (ref 15–143)

## 2024-05-06 LAB — PROTEIN ELECTROPHORESIS, SERUM, WITH REFLEX
A/G Ratio: 1.4 (ref 0.7–1.7)
Albumin ELP: 3.9 g/dL (ref 2.9–4.4)
Alpha-1-Globulin: 0.2 g/dL (ref 0.0–0.4)
Alpha-2-Globulin: 0.7 g/dL (ref 0.4–1.0)
Beta Globulin: 0.9 g/dL (ref 0.7–1.3)
Gamma Globulin: 1 g/dL (ref 0.4–1.8)
Globulin, Total: 2.8 g/dL (ref 2.2–3.9)
Total Protein ELP: 6.7 g/dL (ref 6.0–8.5)

## 2024-05-06 LAB — KAPPA/LAMBDA LIGHT CHAINS
Kappa free light chain: 28.7 mg/L — ABNORMAL HIGH (ref 3.3–19.4)
Kappa, lambda light chain ratio: 1.46 (ref 0.26–1.65)
Lambda free light chains: 19.7 mg/L (ref 5.7–26.3)

## 2024-05-08 ENCOUNTER — Ambulatory Visit: Payer: Self-pay

## 2024-05-08 LAB — METHYLMALONIC ACID, SERUM: Methylmalonic Acid, Quantitative: 323 nmol/L (ref 0–378)

## 2024-05-17 NOTE — Progress Notes (Unsigned)
 [facility] OFFICE PROGRESS NOTE  Patient Care Team: Job Lukes, Nicholas Maxwell as PCP - General (Physician Assistant) Watt Rush, MD as Attending Physician (Urology) Nicholas Maxwell., MD as Referring Physician (Gastroenterology) Elmira Newman PARAS, MD as Consulting Physician (Cardiology) [facility], [facility] (Gastroenterology) Ernie Cough, MD as Consulting Physician (Orthopedic Surgery) Nicholaus Sherlean CROME, [facility] (Inactive) (Pharmacist) [facility] as Consulting Physician (Optometry)  78 y.o. 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   Laboratory studies show macrocytic anemia. Previous ferritin was normal. Normal LFT, B12. He has CKD. No clear findings so far. PSA 0.15 in 05/2023.   Mr. Spickler has no concerning symptoms, overall he is feeling well.  His anemia improved. He has renal insufficiency. This can result in elevated LC. M protein not found. We went over the results today.  Slight abnormality in light chains but no monoclonal protein identified.    B12 is lower. Will resume b12.  Last PSA was 0.17. will continue follow up with Dr. Watt.  Renal function is stable. Assessment & Plan Macrocytic anemia Stable. Continue monitor Low serum vitamin B12 B12 500 mcg 3 times a week. Chronic kidney disease, stage 3b (HCC) Cr stable 1.3-1.7 over last 3 years. Malignant neoplasm of prostate [facility]) Definitive Radiotherapy: 09/22/20-11/03/20 Site/dose:   The prostate was treated to 70 Gy in 28 fractions of 2.5 Gy Following urology  Last PSA 0.17. nadir 0.024 in 10/2021. Plan to follow up every 6 months with Dr. Watt next month Elevated serum immunoglobulin free light chain level Will follow up in about 4 months with lab about 2 weeks before visit.  Orders Placed  This Encounter  Procedures   CBC with Differential ([facility] Only)    Standing Status:   Future    Expiration Date:   05/19/2025   CMP ([facility] only)    Standing Status:   Future    Expiration Date:   05/19/2025   Vitamin B12    Standing Status:   Future    Expiration Date:   05/19/2025   Methylmalonic acid, serum    Standing Status:   Future    Expiration Date:   11/19/2024   Serum protein electrophoresis with reflex    Standing Status:   Future    Expiration Date:   05/19/2025   IgG, IgA, IgM    Standing Status:   Future    Expiration Date:   05/19/2025   Kappa/lambda light chains    Standing Status:   Future    Expiration Date:   05/19/2025     [name], MD  INTERVAL HISTORY: Patient returns for follow-up. He is not taking b12 currently. No heartburn, upper stomach pain, nausea, vomiting, melena, bloody stool, stool change.  Oncology History  Malignant neoplasm of prostate (HCC)  05/05/2020 Cancer Staging   Staging form: Prostate, AJCC 8th Edition - Clinical stage from 05/05/2020: Stage IIC (cT2a, cN0, cM0, PSA: 2.6, Grade Group: 3) - Signed by Sherwood Rise, PA-C on 06/29/2020   06/11/2020 Initial Diagnosis   Malignant neoplasm of prostate (HCC)   09/22/2020 - 11/03/2020 Radiation Therapy   Radiation treatment dates:   09/22/20-11/03/20   Site/dose:   The prostate was treated to 70 Gy in 28 fractions of 2.5 Gy      PHYSICAL EXAMINATION:  ECOG PERFORMANCE STATUS: 1 - Symptomatic but completely ambulatory  Vitals:   05/19/24 0912  BP: (!) 106/54  Pulse: (!) 57  Resp: 18  Temp: (!) 97.2 F (36.2 C)  SpO2: 98%   Filed Weights   05/19/24 0912  Weight: 197 lb 12.8 oz (89.7 kg)    GENERAL: alert, no distress and comfortable SKIN: skin color normal and no petechiae or jaundice on exposed skin EYES: normal, sclera clear NECK: No palpable mass LYMPH:  no palpable cervical, axillary lymphadenopathy  LUNGS: clear to auscultation and percussion with normal  breathing effort HEART: regular rate & rhythm  ABDOMEN: abdomen soft, non-tender and nondistended. Musculoskeletal: no edema   Relevant data reviewed during this visit included labs.

## 2024-05-19 ENCOUNTER — Inpatient Hospital Stay (HOSPITAL_BASED_OUTPATIENT_CLINIC_OR_DEPARTMENT_OTHER)

## 2024-05-19 VITALS — BP 106/54 | HR 57 | Temp 97.2°F | Resp 18 | Ht 70.0 in | Wt 197.8 lb

## 2024-05-19 DIAGNOSIS — R768 Other specified abnormal immunological findings in serum: Secondary | ICD-10-CM

## 2024-05-19 DIAGNOSIS — D539 Nutritional anemia, unspecified: Secondary | ICD-10-CM

## 2024-05-19 DIAGNOSIS — E538 Deficiency of other specified B group vitamins: Secondary | ICD-10-CM | POA: Diagnosis not present

## 2024-05-19 DIAGNOSIS — C61 Malignant neoplasm of prostate: Secondary | ICD-10-CM

## 2024-05-19 DIAGNOSIS — N1832 Chronic kidney disease, stage 3b: Secondary | ICD-10-CM | POA: Diagnosis not present

## 2024-05-19 NOTE — Assessment & Plan Note (Addendum)
 Stable. Continue monitor

## 2024-05-19 NOTE — Assessment & Plan Note (Addendum)
 B12 500 mcg 3 times a week.

## 2024-05-19 NOTE — Assessment & Plan Note (Addendum)
 Definitive Radiotherapy: 09/22/20-11/03/20 Site/dose:   The prostate was treated to 70 Gy in 28 fractions of 2.5 Gy Following urology  Last PSA 0.17. nadir 0.024 in 10/2021. Plan to follow up every 6 months with Dr. Watt next month

## 2024-05-19 NOTE — Assessment & Plan Note (Addendum)
 Will follow up in about 4 months with lab about 2 weeks before visit.

## 2024-05-19 NOTE — Assessment & Plan Note (Addendum)
 Cr stable 1.3-1.7 over last 3 years.

## 2024-05-23 ENCOUNTER — Ambulatory Visit (HOSPITAL_COMMUNITY)
Admission: RE | Admit: 2024-05-23 | Discharge: 2024-05-23 | Disposition: A | Discharge status: Home / Self Care | Payer: PPO | Source: Ambulatory Visit | Attending: Cardiology | Admitting: Cardiology

## 2024-05-23 ENCOUNTER — Ambulatory Visit: Payer: Self-pay | Admitting: Cardiology

## 2024-05-23 ENCOUNTER — Other Ambulatory Visit: Payer: Self-pay | Admitting: Cardiology

## 2024-05-23 DIAGNOSIS — I77819 Aortic ectasia, unspecified site: Secondary | ICD-10-CM | POA: Diagnosis not present

## 2024-05-23 DIAGNOSIS — I2510 Atherosclerotic heart disease of native coronary artery without angina pectoris: Secondary | ICD-10-CM

## 2024-05-29 ENCOUNTER — Ambulatory Visit: Payer: PPO | Admitting: Physician Assistant

## 2024-05-29 ENCOUNTER — Encounter: Payer: Self-pay | Admitting: Physician Assistant

## 2024-05-29 ENCOUNTER — Ambulatory Visit: Payer: Self-pay | Admitting: Cardiology

## 2024-05-29 VITALS — BP 110/70 | HR 46 | Temp 97.3°F | Ht 70.0 in | Wt 193.0 lb

## 2024-05-29 DIAGNOSIS — Q159 Congenital malformation of eye, unspecified: Secondary | ICD-10-CM

## 2024-05-29 DIAGNOSIS — Z7984 Long term (current) use of oral hypoglycemic drugs: Secondary | ICD-10-CM | POA: Diagnosis not present

## 2024-05-29 DIAGNOSIS — N183 Chronic kidney disease, stage 3 unspecified: Secondary | ICD-10-CM

## 2024-05-29 DIAGNOSIS — I25119 Atherosclerotic heart disease of native coronary artery with unspecified angina pectoris: Secondary | ICD-10-CM

## 2024-05-29 DIAGNOSIS — G720 Drug-induced myopathy: Secondary | ICD-10-CM | POA: Diagnosis not present

## 2024-05-29 DIAGNOSIS — E1122 Type 2 diabetes mellitus with diabetic chronic kidney disease: Secondary | ICD-10-CM

## 2024-05-29 DIAGNOSIS — Z1211 Encounter for screening for malignant neoplasm of colon: Secondary | ICD-10-CM

## 2024-05-29 LAB — MICROALBUMIN / CREATININE URINE RATIO
Creatinine,U: 71 mg/dL
Microalb Creat Ratio: UNDETERMINED mg/g (ref 0.0–30.0)
Microalb, Ur: <0.7 mg/dL

## 2024-05-29 LAB — LIPID PANEL
Cholesterol: 123 mg/dL (ref 0–200)
HDL: 48.9 mg/dL (ref >39.00)
LDL Cholesterol: 40 mg/dL (ref 0–99)
NonHDL: 73.98
Total CHOL/HDL Ratio: 3
Triglycerides: 168 mg/dL — ABNORMAL HIGH (ref 0.0–149.0)
VLDL: 33.6 mg/dL (ref 0.0–40.0)

## 2024-05-29 NOTE — Progress Notes (Signed)
 Nicholas Maxwell is a 78 y.o. male here for a follow up of a pre-existing problem.  History of Present Illness:   Chief Complaint  Patient presents with   Diabetes    Discussed the use of AI scribe software for clinical note transcription with the patient, who gave verbal consent to proceed.  History of Present Illness Nicholas Maxwell is a 78 year old male with diabetes and chronic kidney disease who presents for a follow-up visit.  His blood sugar levels have improved, with morning readings decreasing from 142 to 117. He has been on Farxiga  for two to three months without urinary or yeast infections. He started vitamin B12 supplementation last week due to a slight decrease in levels. Regular blood work monitoring continues.  An ultrasound showed no aneurysm.   A colonoscopy in 2018 removed a 3mm polyp, with a follow-up due in April 2025. Another note in 2021 states that a colonoscopy is no longer needed due to his age. He is not sure which information is correct.  He experiences eye redness, treated with an antibiotic and steroid ointment, which he plans to use at night due to its thickness affecting vision.  His weight is 188 pounds, the lowest recorded, with a goal of maintaining around 185 pounds. He remains active and consumes fresh fruits and vegetables.  He plans to receive a flu shot and has completed the shingles vaccination series, with the second dose recently administered.    Past Medical History:  Diagnosis Date   Allergy 12/1998   Seasonal spring and fall. Crestor and statins.   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   Essential hypertension 05/12/2015   History of kidney stones    Macrocytic anemia 02/18/2024   Myocardial infarction [facility]) 04/2017   Prostate cancer (HCC) 05/2020   Statin intolerance    Used >2 different statins   Type 2 diabetes mellitus without complication, without long-term current use of insulin  (HCC) 05/12/2015      Social History   Tobacco Use   Smoking status: Former    Current packs/day: 0.00    Average packs/day: 1 pack/day for 30.0 years (30.0 ttl pk-yrs)    Types: Cigarettes    Start date: 10/03/1971    Quit date: 10/02/2001    Years since quitting: 22.6   Smokeless tobacco: Never  Vaping Use   Vaping status: Never Used  Substance Use Topics   Alcohol use: Not Currently    Comment: has not drank since 2014   Drug use: No    Past Surgical History:  Procedure Laterality Date   COLONOSCOPY     March of 2018    CORONARY ANGIOPLASTY WITH STENT PLACEMENT  05/24/2017   CORONARY PRESSURE/FFR STUDY N/A 05/24/2017   Procedure: INTRAVASCULAR PRESSURE WIRE/FFR STUDY;  Surgeon: Elmira Newman PARAS, MD;  Location: [facility] INVASIVE CV LAB;  Service: Cardiovascular;  Laterality: N/A;  MID LAD   CORONARY STENT INTERVENTION N/A 05/24/2017   Procedure: CORONARY STENT INTERVENTION;  Surgeon: Elmira Newman PARAS, MD;  Location: [facility] INVASIVE CV LAB;  Service: Cardiovascular;  Laterality: N/A;  MID Circumflex 4.0x22 Onyx MID LAD 2.75x15 Onyx   CYSTOSCOPY WITH URETEROSCOPY, STONE BASKETRY AND STENT PLACEMENT  ~ 1990   HERNIA REPAIR     JOINT REPLACEMENT  10/2019   Right knee   KNEE ARTHROSCOPY Right 2016   LEFT HEART CATH AND CORONARY ANGIOGRAPHY N/A 05/24/2017   Procedure: LEFT HEART CATH AND CORONARY ANGIOGRAPHY;  Surgeon: Elmira,  Newman PARAS, MD;  Location: [facility] INVASIVE CV LAB;  Service: Cardiovascular;  Laterality: N/A;   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   Procedure: TOTAL KNEE ARTHROPLASTY;  Surgeon: Ernie Cough, MD;  Location: [facility] ORS;  Service: Orthopedics;  Laterality: Right;  70 mins   UMBILICAL HERNIA REPAIR  1992   UPPER GASTROINTESTINAL ENDOSCOPY      Family History  Problem Relation Age of Onset   Transient ischemic attack Father 15   Bone cancer Father    Cancer Father    Stroke Father    CAD Mother 94       CABG   Diabetes Mother    COPD Mother    Breast cancer Neg Hx     Colon cancer Neg Hx    Pancreatic cancer Neg Hx    Prostate cancer Neg Hx     Allergies  Allergen Reactions   Crestor [Rosuvastatin Calcium] Anaphylaxis and Swelling    Tongue swells/throat closes   Statins Other (See Comments)    Muscle/joint pain with ALL other statin drug but Crestor is severe    Current Medications:   Current Outpatient Medications:    ALFUZOSIN  HCL ER PO, Take 10 mg by mouth daily in the afternoon. Prescribed by Dr. Watt, Disp: , Rfl:    allopurinol  (ZYLOPRIM ) 300 MG tablet, Take 1 tablet (300 mg total) by mouth daily. TAKE 1 TABLET BY MOUTH DAILY WITH LUNCH., Disp: 90 tablet, Rfl: 1   ASPIRIN  81 81 MG chewable tablet, Chew 81 mg by mouth daily., Disp: , Rfl:    Blood Glucose Monitoring Suppl (ONE TOUCH ULTRA 2) w/Device KIT, USE TO CHECK BLOOD SUGAR, Disp: 1 kit, Rfl: 0   Cyanocobalamin  (VITAMIN B 12) 500 MCG TABS, Take 1 tablet by mouth daily in the afternoon., Disp: , Rfl:    dapagliflozin  propanediol (FARXIGA ) 5 MG TABS tablet, TAKE 1 TABLET BY MOUTH DAILY BEFORE BREAKFAST., Disp: 30 tablet, Rfl: 2   Evolocumab  (REPATHA  SURECLICK) 140 MG/ML SOAJ, Inject 140 mg into the skin every 14 (fourteen) days., Disp: 6 mL, Rfl: 3   glucose blood (ONETOUCH ULTRA) test strip, USE TO CHECK BLOOD SUGAR DAILY AND PRN, Disp: 100 each, Rfl: 4   losartan  (COZAAR ) 25 MG tablet, Take 1 tablet (25 mg total) by mouth daily., Disp: 90 tablet, Rfl: 2   metoprolol  succinate (TOPROL -XL) 25 MG 24 hr tablet, TAKE 1 TABLET BY MOUTH EVERY DAY, Disp: 30 tablet, Rfl: 0   neomycin-polymyxin b-dexamethasone  (MAXITROL) 3.5-10000-0.1 OINT, Place 1 Application into both eyes 2 (two) times daily as needed., Disp: , Rfl:    nitroGLYCERIN  (NITROSTAT ) 0.4 MG SL tablet, Place 1 tablet (0.4 mg total) under the tongue every 5 (five) minutes x 3 doses as needed for chest pain., Disp: 30 tablet, Rfl: 2   ranolazine  (RANEXA ) 1000 MG SR tablet, TAKE 1 TABLET BY MOUTH TWICE A DAY, Disp: 180 tablet, Rfl: 2    Review of Systems:   Negative unless otherwise specified per HPI.  Vitals:   Vitals:   05/29/24 0907  BP: 110/70  Pulse: (!) 46  Temp: (!) 97.3 F (36.3 C)  TempSrc: Temporal  SpO2: 98%  Weight: 193 lb (87.5 kg)  Height: 5' 10 (1.778 m)     Body mass index is 27.69 kg/m.  Physical Exam:   Physical Exam Vitals and nursing note reviewed.  Constitutional:      General: He is not in acute distress.    Appearance: He is well-developed. He is not ill-appearing  or toxic-appearing.  Eyes:     Comments: Bilateral lower lids with erythema  Cardiovascular:     Rate and Rhythm: Normal rate and regular rhythm.     Pulses: Normal pulses.     Heart sounds: Normal heart sounds, S1 normal and S2 normal.  Pulmonary:     Effort: Pulmonary effort is normal.     Breath sounds: Normal breath sounds.  Skin:    General: Skin is warm and dry.  Neurological:     Mental Status: He is alert.     GCS: GCS eye subscore is 4. GCS verbal subscore is 5. GCS motor subscore is 6.  Psychiatric:        Speech: Speech normal.        Behavior: Behavior normal. Behavior is cooperative.     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 No infections reported. CKD stable at stage 3.  Metformin  contraindicated due to kidney function. - Check cholesterol panel. - Perform urine test for proteinuria. - Continue Farxiga  5 mg daily.  CAD; Drug induced myopathy -- cannot tolerate statins Compliant with Repatha  Update lipid panel  Blepharitis Redness with sty and scab. Previous treatment improved symptoms but caused vision blurring. - Advise using ointment at night.     Lucie Buttner, PA-C

## 2024-05-29 NOTE — Patient Instructions (Addendum)
  VISIT SUMMARY: You had a follow-up visit to review your diabetes and chronic kidney disease management. Your blood sugar levels have improved, and your kidney disease remains stable. We also discussed your eye redness and weight management.  YOUR PLAN: TYPE 2 DIABETES MELLITUS WITH CHRONIC KIDNEY DISEASE STAGE 3: Your blood sugar levels are controlled with Farxiga , and your chronic kidney disease is stable at stage 3. -Continue taking Farxiga  as prescribed. -We will check your cholesterol levels. -We will perform a urine test to check for protein.  BLEPHARITIS: You have redness in your eyes with a sty and scab. The previous treatment improved your symptoms but caused vision blurring. -Use the antibiotic and steroid ointment at night to avoid vision blurring.   We will place referral for gastroenterology for colon cancer screening  Follow up in 3-6 months                    Contains text generated by [employer].                                 Contains text generated by [employer]bridge.

## 2024-05-30 ENCOUNTER — Encounter: Payer: Self-pay | Admitting: Physician Assistant

## 2024-05-30 ENCOUNTER — Ambulatory Visit: Payer: Self-pay | Admitting: Physician Assistant

## 2024-06-04 ENCOUNTER — Other Ambulatory Visit: Payer: Self-pay

## 2024-06-05 MED ORDER — RANOLAZINE ER 1000 MG PO TB12: 1000.0000 mg | ORAL_TABLET | Freq: Two times a day (BID) | ORAL | 0 refills | Status: DC

## 2024-06-11 DIAGNOSIS — Z8546 Personal history of malignant neoplasm of prostate: Secondary | ICD-10-CM | POA: Diagnosis not present

## 2024-06-11 DIAGNOSIS — E349 Endocrine disorder, unspecified: Secondary | ICD-10-CM | POA: Diagnosis not present

## 2024-06-17 ENCOUNTER — Other Ambulatory Visit: Payer: Self-pay | Admitting: Cardiology

## 2024-06-17 DIAGNOSIS — I2510 Atherosclerotic heart disease of native coronary artery without angina pectoris: Secondary | ICD-10-CM

## 2024-06-18 DIAGNOSIS — N401 Enlarged prostate with lower urinary tract symptoms: Secondary | ICD-10-CM | POA: Diagnosis not present

## 2024-06-18 DIAGNOSIS — R351 Nocturia: Secondary | ICD-10-CM | POA: Diagnosis not present

## 2024-06-18 DIAGNOSIS — Z8546 Personal history of malignant neoplasm of prostate: Secondary | ICD-10-CM | POA: Diagnosis not present

## 2024-06-18 DIAGNOSIS — E230 Hypopituitarism: Secondary | ICD-10-CM | POA: Diagnosis not present

## 2024-06-21 ENCOUNTER — Other Ambulatory Visit: Payer: Self-pay | Admitting: Cardiology

## 2024-06-21 DIAGNOSIS — I2510 Atherosclerotic heart disease of native coronary artery without angina pectoris: Secondary | ICD-10-CM

## 2024-07-01 ENCOUNTER — Other Ambulatory Visit: Payer: Self-pay | Admitting: *Deleted

## 2024-07-01 ENCOUNTER — Other Ambulatory Visit: Payer: Self-pay | Admitting: Cardiology

## 2024-07-01 DIAGNOSIS — E119 Type 2 diabetes mellitus without complications: Secondary | ICD-10-CM

## 2024-07-01 MED ORDER — DAPAGLIFLOZIN PROPANEDIOL 5 MG PO TABS: 5.0000 mg | ORAL_TABLET | Freq: Every day | ORAL | 5 refills | Status: AC

## 2024-07-01 MED ORDER — LOSARTAN POTASSIUM 25 MG PO TABS: 25.0000 mg | ORAL_TABLET | Freq: Every day | ORAL | 0 refills | Status: DC

## 2024-07-18 ENCOUNTER — Other Ambulatory Visit: Payer: Self-pay | Admitting: Cardiology

## 2024-07-18 DIAGNOSIS — I2510 Atherosclerotic heart disease of native coronary artery without angina pectoris: Secondary | ICD-10-CM

## 2024-07-26 ENCOUNTER — Other Ambulatory Visit: Payer: Self-pay | Admitting: Cardiology

## 2024-07-26 DIAGNOSIS — E119 Type 2 diabetes mellitus without complications: Secondary | ICD-10-CM

## 2024-08-04 ENCOUNTER — Encounter: Payer: Self-pay | Admitting: Physician Assistant

## 2024-08-04 ENCOUNTER — Encounter: Payer: Self-pay | Admitting: Radiology

## 2024-08-04 ENCOUNTER — Ambulatory Visit: Payer: Self-pay | Admitting: Physician Assistant

## 2024-08-04 DIAGNOSIS — H029 Unspecified disorder of eyelid: Secondary | ICD-10-CM

## 2024-08-04 NOTE — Telephone Encounter (Signed)
 FYI Only or Action Required?: Action required by provider: clinical question for provider.  Patient was last seen in primary care on 05/29/2024 by Job Lukes, PA.  Called Nurse Triage reporting Belepharitis.  Symptoms began several months ago.  Interventions attempted: Prescription medications: eye drops.  Symptoms are: unchanged.  Triage Disposition: See PCP Within 2 Weeks  Patient/caregiver understands and will follow disposition?: Yes                Copied from CRM [id number]. Topic: General - Other >> Aug 04, 2024  4:08 PM Nicholas Maxwell wrote: Reason for CRM: patient asking to be referred to an ophthalmologist in the area for his red eye lids. Requesting a call or a mychart message. Reason for Disposition  Eyelid swelling is a chronic problem (recurrent or ongoing AND present > 4 weeks)  Answer Assessment - Initial Assessment Questions Pt is willing to come in for an appt if necessary. Pt wants a referral for an ophthalmologist. Pt requests call back.  Eyelid redness Eyes do water  intermittently, burn intermittently Onset: March 2025 Pt was given eye drops but as soon as pt stops using this, it comes back Pt wants a referral to an ophthalmologist  Denies swelling, itchiness, pain  Protocols used: Eyelid Swelling-A-AH

## 2024-08-04 NOTE — Telephone Encounter (Signed)
 See MyChart message

## 2024-08-09 ENCOUNTER — Other Ambulatory Visit: Payer: Self-pay | Admitting: Cardiology

## 2024-08-09 DIAGNOSIS — E119 Type 2 diabetes mellitus without complications: Secondary | ICD-10-CM

## 2024-08-13 ENCOUNTER — Other Ambulatory Visit: Payer: Self-pay | Admitting: Cardiology

## 2024-08-13 DIAGNOSIS — I2510 Atherosclerotic heart disease of native coronary artery without angina pectoris: Secondary | ICD-10-CM

## 2024-08-15 ENCOUNTER — Encounter: Payer: Self-pay | Admitting: Physician Assistant

## 2024-08-17 ENCOUNTER — Other Ambulatory Visit: Payer: Self-pay | Admitting: Cardiology

## 2024-08-17 DIAGNOSIS — E119 Type 2 diabetes mellitus without complications: Secondary | ICD-10-CM

## 2024-08-17 DIAGNOSIS — I2510 Atherosclerotic heart disease of native coronary artery without angina pectoris: Secondary | ICD-10-CM

## 2024-08-18 DIAGNOSIS — H02105 Unspecified ectropion of left lower eyelid: Secondary | ICD-10-CM | POA: Diagnosis not present

## 2024-08-18 DIAGNOSIS — H02102 Unspecified ectropion of right lower eyelid: Secondary | ICD-10-CM | POA: Diagnosis not present

## 2024-08-22 ENCOUNTER — Other Ambulatory Visit: Payer: Self-pay | Admitting: Cardiology

## 2024-08-29 ENCOUNTER — Other Ambulatory Visit: Payer: Self-pay | Admitting: Cardiology

## 2024-08-29 DIAGNOSIS — I2510 Atherosclerotic heart disease of native coronary artery without angina pectoris: Secondary | ICD-10-CM

## 2024-08-29 DIAGNOSIS — E119 Type 2 diabetes mellitus without complications: Secondary | ICD-10-CM

## 2024-09-01 ENCOUNTER — Inpatient Hospital Stay

## 2024-09-01 DIAGNOSIS — Z79899 Other long term (current) drug therapy: Secondary | ICD-10-CM | POA: Diagnosis not present

## 2024-09-01 DIAGNOSIS — D539 Nutritional anemia, unspecified: Secondary | ICD-10-CM | POA: Diagnosis present

## 2024-09-01 DIAGNOSIS — Z8546 Personal history of malignant neoplasm of prostate: Secondary | ICD-10-CM | POA: Diagnosis not present

## 2024-09-01 DIAGNOSIS — H02105 Unspecified ectropion of left lower eyelid: Secondary | ICD-10-CM | POA: Diagnosis not present

## 2024-09-01 DIAGNOSIS — H02102 Unspecified ectropion of right lower eyelid: Secondary | ICD-10-CM | POA: Diagnosis not present

## 2024-09-01 DIAGNOSIS — N1832 Chronic kidney disease, stage 3b: Secondary | ICD-10-CM

## 2024-09-01 DIAGNOSIS — E538 Deficiency of other specified B group vitamins: Secondary | ICD-10-CM

## 2024-09-01 DIAGNOSIS — R7689 Other specified abnormal immunological findings in serum: Secondary | ICD-10-CM

## 2024-09-01 LAB — CBC WITH DIFFERENTIAL (CANCER CENTER ONLY)
Abs Immature Granulocytes: 0.03 K/uL (ref 0.00–0.07)
Basophils Absolute: 0 K/uL (ref 0.0–0.1)
Basophils Relative: 0 %
Eosinophils Absolute: 0.1 K/uL (ref 0.0–0.5)
Eosinophils Relative: 1 %
HCT: 36.9 % — ABNORMAL LOW (ref 39.0–52.0)
Hemoglobin: 12.6 g/dL — ABNORMAL LOW (ref 13.0–17.0)
Immature Granulocytes: 0 %
Lymphocytes Relative: 12 %
Lymphs Abs: 1.1 K/uL (ref 0.7–4.0)
MCH: 34.8 pg — ABNORMAL HIGH (ref 26.0–34.0)
MCHC: 34.1 g/dL (ref 30.0–36.0)
MCV: 101.9 fL — ABNORMAL HIGH (ref 80.0–100.0)
Monocytes Absolute: 0.6 K/uL (ref 0.1–1.0)
Monocytes Relative: 7 %
Neutro Abs: 6.7 K/uL (ref 1.7–7.7)
Neutrophils Relative %: 80 %
Platelet Count: 165 K/uL (ref 150–400)
RBC: 3.62 MIL/uL — ABNORMAL LOW (ref 4.22–5.81)
RDW: 12.5 % (ref 11.5–15.5)
WBC Count: 8.5 K/uL (ref 4.0–10.5)
nRBC: 0 % (ref 0.0–0.2)

## 2024-09-01 LAB — CMP (CANCER CENTER ONLY)
ALT: 16 U/L (ref 0–44)
AST: 19 U/L (ref 15–41)
Albumin: 4.5 g/dL (ref 3.5–5.0)
Alkaline Phosphatase: 73 U/L (ref 38–126)
Anion gap: 12 (ref 5–15)
BUN: 27 mg/dL — ABNORMAL HIGH (ref 8–23)
CO2: 26 mmol/L (ref 22–32)
Calcium: 9.9 mg/dL (ref 8.9–10.3)
Chloride: 102 mmol/L (ref 98–111)
Creatinine: 1.52 mg/dL — ABNORMAL HIGH (ref 0.61–1.24)
GFR, Estimated: 47 mL/min — ABNORMAL LOW (ref >60)
Glucose, Bld: 142 mg/dL — ABNORMAL HIGH (ref 70–99)
Potassium: 4.8 mmol/L (ref 3.5–5.1)
Sodium: 140 mmol/L (ref 135–145)
Total Bilirubin: 0.6 mg/dL (ref 0.0–1.2)
Total Protein: 7.7 g/dL (ref 6.5–8.1)

## 2024-09-01 LAB — VITAMIN B12: Vitamin B-12: 636 pg/mL (ref 180–914)

## 2024-09-02 LAB — PROTEIN ELECTROPHORESIS, SERUM, WITH REFLEX
A/G Ratio: 1.2 (ref 0.7–1.7)
Albumin ELP: 3.8 g/dL (ref 2.9–4.4)
Alpha-1-Globulin: 0.2 g/dL (ref 0.0–0.4)
Alpha-2-Globulin: 0.9 g/dL (ref 0.4–1.0)
Beta Globulin: 1 g/dL (ref 0.7–1.3)
Gamma Globulin: 1.2 g/dL (ref 0.4–1.8)
Globulin, Total: 3.3 g/dL (ref 2.2–3.9)
Total Protein ELP: 7.1 g/dL (ref 6.0–8.5)

## 2024-09-02 LAB — IGG, IGA, IGM
IgA: 212 mg/dL (ref 61–437)
IgG (Immunoglobin G), Serum: 1280 mg/dL (ref 603–1613)
IgM (Immunoglobulin M), Srm: 67 mg/dL (ref 15–143)

## 2024-09-02 LAB — KAPPA/LAMBDA LIGHT CHAINS
Kappa free light chain: 29.1 mg/L — ABNORMAL HIGH (ref 3.3–19.4)
Kappa, lambda light chain ratio: 1.49 (ref 0.26–1.65)
Lambda free light chains: 19.5 mg/L (ref 5.7–26.3)

## 2024-09-03 LAB — METHYLMALONIC ACID, SERUM: Methylmalonic Acid, Quantitative: 348 nmol/L (ref 0–378)

## 2024-09-04 ENCOUNTER — Ambulatory Visit: Payer: Self-pay

## 2024-09-14 NOTE — Assessment & Plan Note (Signed)
 Will follow up in about 6 months with lab about 2 weeks before visit.

## 2024-09-14 NOTE — Progress Notes (Unsigned)
 [facility] OFFICE PROGRESS NOTE  Patient Care Team: Job Lukes, [place] as PCP - General (Physician Assistant) Watt Rush, MD as Attending Physician (Urology) Debarah Charlie Franky Mickey., MD as Referring Physician (Gastroenterology) Elmira Newman PARAS, MD as Consulting Physician (Cardiology) [facility], [facility] (Gastroenterology) Ernie Cough, MD as Consulting Physician (Orthopedic Surgery) Nicholaus Sherlean CROME, [facility] (Inactive) (Pharmacist)  78 y.o. 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   Laboratory studies show macrocytic anemia. Previous ferritin was normal. Normal LFT, B12. He has CKD. No clear findings so far. PSA 0.15 in 05/2023.   Assessment & Plan   No orders of the defined types were placed in this encounter.    [name], MD  INTERVAL HISTORY: Patient returns for follow-up.  Oncology History  Malignant neoplasm of prostate (HCC)  05/05/2020 Cancer Staging   Staging form: Prostate, AJCC 8th Edition - Clinical stage from 05/05/2020: Stage IIC (cT2a, cN0, cM0, PSA: 2.6, Grade Group: 3) - Signed by Sherwood Rise, PA-C on 06/29/2020   06/11/2020 Initial Diagnosis   Malignant neoplasm of prostate (HCC)   09/22/2020 - 11/03/2020 Radiation Therapy   Radiation treatment dates:   09/22/20-11/03/20   Site/dose:   The prostate was treated to 70 Gy in 28 fractions of 2.5 Gy      PHYSICAL EXAMINATION: ECOG PERFORMANCE STATUS: {CHL ONC ECOG [id number]}  There were no vitals filed for this visit. There were no vitals filed for this visit.  GENERAL: alert, no distress and comfortable SKIN: skin color normal and no jaundice or bruising or petechiae on exposed skin EYES: normal, sclera clear OROPHARYNX: no exudate  NECK: No palpable mass LYMPH:  no  palpable cervical, axillary lymphadenopathy  LUNGS: clear to auscultation and no wheeze or rales with normal breathing effort HEART: regular rate & rhythm  ABDOMEN: abdomen soft, non-tender and nondistended. Musculoskeletal: no edema NEURO: no focal motor/sensory deficits  Relevant data reviewed during this visit included labs.  New labs ordered.

## 2024-09-15 ENCOUNTER — Inpatient Hospital Stay

## 2024-09-15 VITALS — BP 115/59 | HR 52 | Temp 97.5°F | Resp 16 | Ht 70.0 in | Wt 201.0 lb

## 2024-09-15 DIAGNOSIS — D539 Nutritional anemia, unspecified: Secondary | ICD-10-CM

## 2024-09-15 DIAGNOSIS — R7689 Other specified abnormal immunological findings in serum: Secondary | ICD-10-CM | POA: Diagnosis not present

## 2024-09-15 DIAGNOSIS — E538 Deficiency of other specified B group vitamins: Secondary | ICD-10-CM | POA: Diagnosis not present

## 2024-09-15 NOTE — Assessment & Plan Note (Addendum)
 Stable.  Will continue B12 Continue monitor

## 2024-09-15 NOTE — Assessment & Plan Note (Addendum)
 B12 500 mcg 3 times a week.

## 2024-09-22 ENCOUNTER — Encounter: Payer: Self-pay | Admitting: Cardiology

## 2024-09-22 ENCOUNTER — Ambulatory Visit: Attending: Cardiology | Admitting: Cardiology

## 2024-09-22 VITALS — BP 100/50 | HR 54 | Ht 70.0 in | Wt 199.0 lb

## 2024-09-22 DIAGNOSIS — I2510 Atherosclerotic heart disease of native coronary artery without angina pectoris: Secondary | ICD-10-CM

## 2024-09-22 DIAGNOSIS — I10 Essential (primary) hypertension: Secondary | ICD-10-CM | POA: Diagnosis not present

## 2024-09-22 DIAGNOSIS — E119 Type 2 diabetes mellitus without complications: Secondary | ICD-10-CM

## 2024-09-22 MED ORDER — LOSARTAN POTASSIUM 25 MG PO TABS: 25.0000 mg | ORAL_TABLET | Freq: Every day | ORAL | 3 refills | Status: AC

## 2024-09-22 MED ORDER — ASPIRIN 81 81 MG PO CHEW: 81.0000 mg | CHEWABLE_TABLET | Freq: Every day | ORAL | 3 refills | Status: AC

## 2024-09-22 MED ORDER — RANOLAZINE ER 1000 MG PO TB12: 1000.0000 mg | ORAL_TABLET | Freq: Two times a day (BID) | ORAL | 2 refills | Status: AC

## 2024-09-22 MED ORDER — REPATHA SURECLICK 140 MG/ML ~~LOC~~ SOAJ: 140.0000 mg | SUBCUTANEOUS | 3 refills | Status: AC

## 2024-09-22 MED ORDER — METOPROLOL SUCCINATE ER 25 MG PO TB24: 25.0000 mg | ORAL_TABLET | Freq: Every day | ORAL | 3 refills | Status: AC

## 2024-09-22 MED ORDER — NITROGLYCERIN 0.4 MG SL SUBL: 0.4000 mg | SUBLINGUAL_TABLET | SUBLINGUAL | 2 refills | Status: AC | PRN

## 2024-09-22 NOTE — Patient Instructions (Signed)
 Medication Instructions:  Refills sent in  *If you need a refill on your cardiac medications before your next appointment, please call your pharmacy*  Follow-Up: At [facility], you and your health needs are our priority.  As part of our continuing mission to provide you with exceptional heart care, our providers are all part of one team.  This team includes your primary Cardiologist (physician) and Advanced Practice Providers or APPs (Physician Assistants and Nurse Practitioners) who all work together to provide you with the care you need, when you need it.  Your next appointment:   1 year(s)  Provider:   Newman JINNY Lawrence, MD

## 2024-09-22 NOTE — Progress Notes (Addendum)
 " Cardiology Office Note:  .   Date:  09/22/2024  ID:  Nicholas Maxwell, DOB 1945-10-07, MRN [id number] PCP: Job Lukes, PA  [facility] Providers Cardiologist:  Newman Lawrence, MD PCP: Job Lukes, PA  Chief Complaint  Patient presents with   Coronary Artery Disease     Nicholas Maxwell is a 77 y.o. male with hypertension, hyperlipidemia, CAD s/p LAD and Lcx PCI 2018, hypertension, type 2 DM, CKD 3, h/o prostate cancer   Discussed the use of AI scribe software for clinical note transcription with the patient, who gave verbal consent to proceed.  History of Present Illness  Patient is doing well, has no new complaints.  He is still exercises regularly, [facility] [employer], with no complaints of chest pain or shortness of breath.     Vitals:   09/22/24 1059  BP: (!) 100/50  Pulse: (!) 54  SpO2: 99%      Review of Systems  Cardiovascular:  Negative for chest pain, dyspnea on exertion, leg swelling, palpitations and syncope.        Studies Reviewed: SABRA        EKG 09/22/2024:  Sinus rhythm 54 bpm When compared with ECG of 19-Sep-2019 11:54, No significant change was found     Aorta duplex 05/2024: Abdominal Aorta: No evidence of an abdominal aortic aneurysm was  visualized.      Echocardiogram 2023: Left ventricle cavity is normal in size. Moderate concentric hypertrophy of the left ventricle. Normal global wall motion. Normal LV systolic function with EF 60%. Doppler evidence of grade I (impaired) diastolic dysfunction, normal LAP. 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 Normal right atrial pressure. Unlike previous study in 2018, wall motion abnormalities, mod MR not appreciated on this study.   Exercise nuclear stress test 2023: Myocardial perfusion is normal. Overall LV systolic function is normal without regional wall motion abnormalities. Stress LV EF: 55%. Low risk study. Normal ECG  stress. The patient exercised for 6 minutes and 3 seconds of a Bruce protocol, achieving approximately 7.1 METs and 88% MPHR. The heart rate response was normal. The blood pressure response was normal. No previous exam available for comparison.  Coronary intervention 2018: LM: Normal LAD: 60% mid LAD calcific stenosis FFR +ve 0.77. Mild diffuse disease in small caliber distal LAD  Successful complex PTCA and DES placement Onyx 2.75 X 15 mm Mid LAD     (60%-->0%, TIMI III - TIMI III) Ramus intermedius: Small caliber vessel with 50-70% stenoses LCx: Large dominant vessel with mid circumflex subtotal occlusion with retrograde filling of distal circumflex and PDA Successful PTCA and DES placement Onyx 4.0 X 22 mm Mid left circumflex     (99%-->0%, TIMI I - TIMI III) RCA: Small caliber nondominant vessel with mild diffuse disease    Labs 8-09/2024: Chol 123, TG 168, HDL 48, LDL 40 HbA1C 6.2% Hb 12.6 Cr 1.52, eGFR 47 TSH 2.2    Physical Exam Vitals and nursing note reviewed.  Constitutional:      General: He is not in acute distress. Neck:     Vascular: No JVD.  Cardiovascular:     Rate and Rhythm: Normal rate and regular rhythm.     Heart sounds: Normal heart sounds. No murmur heard. Pulmonary:     Effort: Pulmonary effort is normal.     Breath sounds: Normal breath sounds. No wheezing or rales.  Musculoskeletal:     Right lower leg: No  edema.     Left lower leg: No edema.      VISIT DIAGNOSES:   ICD-10-CM   1. Coronary artery disease involving native coronary artery of native heart without angina pectoris  I25.10 EKG 12-Lead    metoprolol  succinate (TOPROL -XL) 25 MG 24 hr tablet    nitroGLYCERIN  (NITROSTAT ) 0.4 MG SL tablet    2. Essential hypertension  I10 EKG 12-Lead    3. Type 2 diabetes mellitus without complication, without long-term current use of insulin  (HCC)  E11.9 losartan  (COZAAR ) 25 MG tablet       Nicholas Maxwell is a 78 y.o. male with hypertension,  hyperlipidemia, CAD s/p LAD and Lcx PCI 2018, hypertension, type 2 DM, CKD 3, h/o prostate cancer   Assessment & Plan  CAD: S/p multivessel PCI 2018. No ischemia on stress testing (08/2022). Continue aspirin , Repatha , metoprolol , Ranexa , losartan  25 mg daily.   Mixed hyperlipidemia H/o statin myopathy. Well controlled on Repatha .    Essential hypertension Controlled.  Blood pressure lower than usual today, but no complaints of lightheadedness.   Type 2 diabetes mellitus: Management as per PCP   Meds ordered this encounter  Medications   ASPIRIN  81 81 MG chewable tablet    Sig: Chew 1 tablet (81 mg total) by mouth daily.    Dispense:  90 tablet    Refill:  3   Evolocumab  (REPATHA  SURECLICK) 140 MG/ML SOAJ    Sig: Inject 140 mg into the skin every 14 (fourteen) days.    Dispense:  6 mL    Refill:  3   losartan  (COZAAR ) 25 MG tablet    Sig: Take 1 tablet (25 mg total) by mouth daily.    Dispense:  90 tablet    Refill:  3   metoprolol  succinate (TOPROL -XL) 25 MG 24 hr tablet    Sig: Take 1 tablet (25 mg total) by mouth daily.    Dispense:  90 tablet    Refill:  3   nitroGLYCERIN  (NITROSTAT ) 0.4 MG SL tablet    Sig: Place 1 tablet (0.4 mg total) under the tongue every 5 (five) minutes x 3 doses as needed for chest pain.    Dispense:  30 tablet    Refill:  2   ranolazine  (RANEXA ) 1000 MG SR tablet    Sig: Take 1 tablet (1,000 mg total) by mouth 2 (two) times daily.    Dispense:  180 tablet    Refill:  2     F/u in 1 year  Signed, Newman JINNY Lawrence, MD  "

## 2024-10-29 ENCOUNTER — Encounter: Payer: Self-pay | Admitting: Physician Assistant

## 2024-10-29 ENCOUNTER — Ambulatory Visit: Admitting: Physician Assistant

## 2024-10-29 VITALS — BP 122/54 | HR 52 | Temp 98.2°F | Ht 70.0 in | Wt 200.0 lb

## 2024-10-29 DIAGNOSIS — E538 Deficiency of other specified B group vitamins: Secondary | ICD-10-CM | POA: Diagnosis not present

## 2024-10-29 DIAGNOSIS — M109 Gout, unspecified: Secondary | ICD-10-CM

## 2024-10-29 DIAGNOSIS — E1122 Type 2 diabetes mellitus with diabetic chronic kidney disease: Secondary | ICD-10-CM | POA: Diagnosis not present

## 2024-10-29 DIAGNOSIS — I10 Essential (primary) hypertension: Secondary | ICD-10-CM

## 2024-10-29 DIAGNOSIS — H029 Unspecified disorder of eyelid: Secondary | ICD-10-CM

## 2024-10-29 DIAGNOSIS — Z8546 Personal history of malignant neoplasm of prostate: Secondary | ICD-10-CM | POA: Diagnosis not present

## 2024-10-29 DIAGNOSIS — N1832 Chronic kidney disease, stage 3b: Secondary | ICD-10-CM

## 2024-10-29 DIAGNOSIS — Z7984 Long term (current) use of oral hypoglycemic drugs: Secondary | ICD-10-CM

## 2024-10-29 LAB — COMPREHENSIVE METABOLIC PANEL WITH GFR
ALT: 18 U/L (ref 3–53)
AST: 18 U/L (ref 5–37)
Albumin: 4.1 g/dL (ref 3.5–5.2)
Alkaline Phosphatase: 47 U/L (ref 39–117)
BUN: 24 mg/dL — ABNORMAL HIGH (ref 6–23)
CO2: 29 meq/L (ref 19–32)
Calcium: 9.4 mg/dL (ref 8.4–10.5)
Chloride: 103 meq/L (ref 96–112)
Creatinine, Ser: 1.7 mg/dL — ABNORMAL HIGH (ref 0.40–1.50)
GFR: 38.36 mL/min — ABNORMAL LOW
Glucose, Bld: 131 mg/dL — ABNORMAL HIGH (ref 70–99)
Potassium: 4.9 meq/L (ref 3.5–5.1)
Sodium: 136 meq/L (ref 135–145)
Total Bilirubin: 0.5 mg/dL (ref 0.2–1.2)
Total Protein: 6.8 g/dL (ref 6.0–8.3)

## 2024-10-29 LAB — URIC ACID: Uric Acid, Serum: 4.5 mg/dL (ref 4.0–7.8)

## 2024-10-29 LAB — MICROALBUMIN / CREATININE URINE RATIO
Creatinine,U: 60.7 mg/dL
Microalb Creat Ratio: UNDETERMINED mg/g (ref 0.0–30.0)
Microalb, Ur: <0.7 mg/dL

## 2024-10-29 LAB — HEMOGLOBIN A1C: Hgb A1c MFr Bld: 6.2 % (ref 4.6–6.5)

## 2024-10-29 NOTE — Progress Notes (Signed)
 "  History of Present Illness:   Chief Complaint  Patient presents with   Diabetes    Pt here for f/u Diabetes. No questions or concerns. Did not fast for labs, Has had some hot apple cider.     Discussed the use of AI scribe software for clinical note transcription with the patient, who gave verbal consent to proceed.  History of Present Illness   Nicholas Maxwell is a 79 year old male who presents for a follow-up visit.  He has had irritation and dryness in his eyes for about a year due to loosening of his lower eyelids and is scheduled for lower eyelid tightening surgery in early April.  His blood pressure is stable with recent readings around 120/60 mmHg. A lower reading of 100/50 mmHg at cardiology was attributed to dehydration. He denies lightheadedness or dizziness. He is currently taking losartan  25 mg daily, toprol  XL 25 mg daily.  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 He feels sluggish and was told the radiation is still working.  He takes Farxiga  5 mg intermittently because it causes constipation, using it for 2 to 3 weeks then stopping for a few days. Sauerkraut helps his constipation. He is concerned about Farxiga  cost but finds it affordable for now.  He has reduced his allopurinol  dose by half, taking 150 mg daily, and feels this is working well. A 90-day supply now lasts 6 months. He wants his uric acid level checked after the dose reduction. He recalls a severe recurrence of symptoms when he previously stopped allopurinol .  He recently shoveled snow, which involved significant exertion. Reports there is no any chest pain with this.       Past Medical History:  Diagnosis Date   Allergy 12/1998   Seasonal spring and fall. Crestor and statins.   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   Essential hypertension 05/12/2015   History of kidney stones    Macrocytic anemia  02/18/2024   Myocardial infarction (HCC) 04/2017   Prostate cancer (HCC) 05/2020   Statin intolerance    Used >2 different statins   Type 2 diabetes mellitus without complication, without long-term current use of insulin  (HCC) 05/12/2015     Social History[1]  Past Surgical History:  Procedure Laterality Date   COLONOSCOPY     March of 2018    CORONARY ANGIOPLASTY WITH STENT PLACEMENT  05/24/2017   CORONARY PRESSURE/FFR STUDY N/A 05/24/2017   Procedure: INTRAVASCULAR PRESSURE WIRE/FFR STUDY;  Surgeon: Elmira Newman PARAS, MD;  Location: [facility];  Service: Cardiovascular;  Laterality: N/A;  MID LAD   CORONARY STENT INTERVENTION N/A 05/24/2017   Procedure: CORONARY STENT INTERVENTION;  Surgeon: Elmira Newman PARAS, MD;  Location: [facility];  Service: Cardiovascular;  Laterality: N/A;  MID Circumflex 4.0x22 Onyx MID LAD 2.75x15 Onyx   CYSTOSCOPY WITH URETEROSCOPY, STONE BASKETRY AND STENT PLACEMENT  ~ 1990   HERNIA REPAIR     JOINT REPLACEMENT  10/2019   Right knee   KNEE ARTHROSCOPY Right 2016   LEFT HEART CATH AND CORONARY ANGIOGRAPHY N/A 05/24/2017   Procedure: LEFT HEART CATH AND CORONARY ANGIOGRAPHY;  Surgeon: Elmira Newman PARAS, MD;  Location: [facility];  Service: Cardiovascular;  Laterality: N/A;   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   Procedure: TOTAL KNEE ARTHROPLASTY;  Surgeon: Ernie Cough, MD;  Location: [facility];  Service: Orthopedics;  Laterality: Right;  70 mins   UMBILICAL HERNIA REPAIR  1992   UPPER GASTROINTESTINAL ENDOSCOPY      Family History  Problem Relation Age of Onset   Transient ischemic attack Father 60   Bone cancer Father    Cancer Father    Stroke Father    CAD Mother 73       CABG   Diabetes Mother    COPD Mother    Breast cancer Neg Hx    Colon cancer Neg Hx    Pancreatic cancer Neg Hx    Prostate cancer Neg Hx     Allergies[2]  Current Medications:  Current Medications[3]   Review of Systems:    Negative unless otherwise specified per HPI.  Vitals:   Vitals:   10/29/24 0911  BP: (!) 122/54  Pulse: (!) 52  Temp: 98.2 F (36.8 C)  TempSrc: Temporal  SpO2: 99%  Weight: 200 lb (90.7 kg)  Height: 5' 10 (1.778 m)     Body mass index is 28.7 kg/m.  Physical Exam:   Physical Exam Vitals and nursing note reviewed.  Constitutional:      General: He is not in acute distress.    Appearance: He is well-developed. He is not ill-appearing or toxic-appearing.  Cardiovascular:     Rate and Rhythm: Normal rate and regular rhythm.     Pulses: Normal pulses.     Heart sounds: Normal heart sounds, S1 normal and S2 normal.  Pulmonary:     Effort: Pulmonary effort is normal.     Breath sounds: Normal breath sounds.  Skin:    General: Skin is warm and dry.  Neurological:     Mental Status: He is alert.     GCS: GCS eye subscore is 4. GCS verbal subscore is 5. GCS motor subscore is 6.  Psychiatric:        Speech: Speech normal.        Behavior: Behavior normal. Behavior is cooperative.     Assessment and Plan:   Assessment and Plan    Type 2 diabetes mellitus with stage 3a chronic kidney disease, without long-term current use of insulin  (HCC)  Blood pressure controlled. Kidney function stable. Farxiga  used intermittently due to constipation. A1c pending for diabetes control assessment. - Continue Farxiga  with breaks for constipation. - Checked kidney function.  Gouty arthropathy  Well-managed with allopurinol . Uric acid levels pending for stability check. - Checked uric acid levels. - Continue allopurinol  as prescribed.  Eyelid abnormality Reviewed with patient Continue plan to surgical intervention to help with this  Essential hypertension Well controlled with losartan  25 mg daily, toprol  XL 25 mg daily. Reports there is no lightheadedness Continue close monitoring at home  B12 deficiency Reviewed most recent hematology note Continue ongoing management    Chronic kidney disease, stage 3b (HCC) Continue efforts at optimal blood pressure and blood sugar goals Recheck today and provide recommendations   History of prostate cancer Continues close follow up with urology as recommended Most recent note reviewed    Lucie Buttner, PA-C     [1]  Social History Tobacco Use   Smoking status: Former    Current packs/day: 0.00    Average packs/day: 1 pack/day for 30.0 years (30.0 ttl pk-yrs)    Types: Cigarettes    Start date: 10/03/1971    Quit date: 10/02/2001    Years since quitting: 23.0   Smokeless tobacco: Never  Vaping Use   Vaping status: Never Used  Substance Use Topics   Alcohol use: Not Currently    Comment: has not drank since 2014   Drug use: No  [2]  Allergies Allergen Reactions   Crestor [Rosuvastatin Calcium] Anaphylaxis and Swelling    Tongue swells/throat closes   Statins Other (See Comments)    Muscle/joint pain with ALL other statin drug but Crestor is severe  [3]  Current Outpatient Medications:    ALFUZOSIN  HCL ER PO, Take 10 mg by mouth daily in the afternoon. Prescribed by Dr. Watt, Disp: , Rfl:    allopurinol  (ZYLOPRIM ) 300 MG tablet, Take 1 tablet (300 mg total) by mouth daily. TAKE 1 TABLET BY MOUTH DAILY WITH LUNCH., Disp: 90 tablet, Rfl: 1   ASPIRIN  81 81 MG chewable tablet, Chew 1 tablet (81 mg total) by mouth daily., Disp: 90 tablet, Rfl: 3   Blood Glucose Monitoring Suppl (ONE TOUCH ULTRA 2) w/Device KIT, USE TO CHECK BLOOD SUGAR, Disp: 1 kit, Rfl: 0   Cyanocobalamin  (VITAMIN B 12) 500 MCG TABS, Take 1 tablet by mouth daily in the afternoon., Disp: , Rfl:    dapagliflozin  propanediol (FARXIGA ) 5 MG TABS tablet, Take 1 tablet (5 mg total) by mouth daily before breakfast., Disp: 30 tablet, Rfl: 5   erythromycin ophthalmic ointment, APPLY OINTMENT TO BOTH EYES AT BEDTIME, Disp: , Rfl:    Evolocumab  (REPATHA  SURECLICK) 140 MG/ML SOAJ, Inject 140 mg into the skin every 14 (fourteen) days., Disp: 6 mL,  Rfl: 3   glucose blood (ONETOUCH ULTRA) test strip, USE TO CHECK BLOOD SUGAR DAILY AND PRN, Disp: 100 each, Rfl: 4   losartan  (COZAAR ) 25 MG tablet, Take 1 tablet (25 mg total) by mouth daily., Disp: 90 tablet, Rfl: 3   metoprolol  succinate (TOPROL -XL) 25 MG 24 hr tablet, Take 1 tablet (25 mg total) by mouth daily., Disp: 90 tablet, Rfl: 3   nitroGLYCERIN  (NITROSTAT ) 0.4 MG SL tablet, Place 1 tablet (0.4 mg total) under the tongue every 5 (five) minutes x 3 doses as needed for chest pain., Disp: 30 tablet, Rfl: 2   prednisoLONE acetate (PRED FORTE) 1 % ophthalmic suspension, 1 drop 4 (four) times daily., Disp: , Rfl:    ranolazine  (RANEXA ) 1000 MG SR tablet, Take 1 tablet (1,000 mg total) by mouth 2 (two) times daily., Disp: 180 tablet, Rfl: 2  "

## 2024-10-30 ENCOUNTER — Ambulatory Visit: Payer: Self-pay | Admitting: Physician Assistant

## 2024-11-04 ENCOUNTER — Ambulatory Visit: Admitting: Cardiology

## 2024-12-22 ENCOUNTER — Encounter

## 2025-03-16 ENCOUNTER — Inpatient Hospital Stay

## 2025-03-30 ENCOUNTER — Inpatient Hospital Stay
